# Patient Record
Sex: Female | Born: 1955 | Race: White | Hispanic: No | Marital: Married | Smoking: Current every day smoker
Health system: Southern US, Community
[De-identification: ages and names within clinical notes are randomized; demographics above are authoritative.]

## PROBLEM LIST (undated history)

## (undated) DIAGNOSIS — I8390 Asymptomatic varicose veins of unspecified lower extremity: Secondary | ICD-10-CM

## (undated) DIAGNOSIS — F329 Major depressive disorder, single episode, unspecified: Secondary | ICD-10-CM

## (undated) DIAGNOSIS — E119 Type 2 diabetes mellitus without complications: Secondary | ICD-10-CM

## (undated) DIAGNOSIS — E785 Hyperlipidemia, unspecified: Secondary | ICD-10-CM

## (undated) DIAGNOSIS — F32A Depression, unspecified: Secondary | ICD-10-CM

## (undated) DIAGNOSIS — E538 Deficiency of other specified B group vitamins: Secondary | ICD-10-CM

## (undated) DIAGNOSIS — J45909 Unspecified asthma, uncomplicated: Secondary | ICD-10-CM

## (undated) DIAGNOSIS — D649 Anemia, unspecified: Secondary | ICD-10-CM

## (undated) DIAGNOSIS — E559 Vitamin D deficiency, unspecified: Secondary | ICD-10-CM

## (undated) DIAGNOSIS — I4510 Unspecified right bundle-branch block: Secondary | ICD-10-CM

## (undated) DIAGNOSIS — I10 Essential (primary) hypertension: Secondary | ICD-10-CM

## (undated) HISTORY — PX: ABDOMINAL HYSTERECTOMY: SHX81

## (undated) HISTORY — DX: Type 2 diabetes mellitus without complications: E11.9

## (undated) HISTORY — DX: Major depressive disorder, single episode, unspecified: F32.9

## (undated) HISTORY — DX: Hyperlipidemia, unspecified: E78.5

## (undated) HISTORY — PX: TONSILLECTOMY: SUR1361

## (undated) HISTORY — DX: Unspecified right bundle-branch block: I45.10

## (undated) HISTORY — DX: Unspecified asthma, uncomplicated: J45.909

## (undated) HISTORY — DX: Deficiency of other specified B group vitamins: E53.8

## (undated) HISTORY — DX: Asymptomatic varicose veins of unspecified lower extremity: I83.90

## (undated) HISTORY — DX: Depression, unspecified: F32.A

## (undated) HISTORY — DX: Vitamin D deficiency, unspecified: E55.9

## (undated) HISTORY — PX: CHOLECYSTECTOMY: SHX55

## (undated) HISTORY — DX: Essential (primary) hypertension: I10

## (undated) HISTORY — DX: Anemia, unspecified: D64.9

---

## 1983-02-06 HISTORY — PX: OTHER SURGICAL HISTORY: SHX169

## 1999-01-04 ENCOUNTER — Other Ambulatory Visit: Admission: RE | Admit: 1999-01-04 | Discharge: 1999-01-04 | Payer: Self-pay | Admitting: Internal Medicine

## 2003-08-25 ENCOUNTER — Ambulatory Visit (HOSPITAL_COMMUNITY): Admission: RE | Admit: 2003-08-25 | Discharge: 2003-08-25 | Payer: Self-pay | Admitting: Internal Medicine

## 2003-10-12 ENCOUNTER — Other Ambulatory Visit: Admission: RE | Admit: 2003-10-12 | Discharge: 2003-10-12 | Payer: Self-pay | Admitting: Internal Medicine

## 2004-11-24 ENCOUNTER — Emergency Department (HOSPITAL_COMMUNITY): Admission: EM | Admit: 2004-11-24 | Discharge: 2004-11-24 | Payer: Self-pay | Admitting: Family Medicine

## 2005-03-22 ENCOUNTER — Emergency Department (HOSPITAL_COMMUNITY): Admission: EM | Admit: 2005-03-22 | Discharge: 2005-03-22 | Payer: Self-pay | Admitting: Family Medicine

## 2005-05-06 DIAGNOSIS — I4510 Unspecified right bundle-branch block: Secondary | ICD-10-CM

## 2005-05-06 HISTORY — DX: Unspecified right bundle-branch block: I45.10

## 2005-05-30 ENCOUNTER — Other Ambulatory Visit: Admission: RE | Admit: 2005-05-30 | Discharge: 2005-05-30 | Payer: Self-pay | Admitting: Internal Medicine

## 2005-10-02 ENCOUNTER — Emergency Department (HOSPITAL_COMMUNITY): Admission: EM | Admit: 2005-10-02 | Discharge: 2005-10-02 | Payer: Self-pay | Admitting: Emergency Medicine

## 2006-06-07 ENCOUNTER — Other Ambulatory Visit: Admission: RE | Admit: 2006-06-07 | Discharge: 2006-06-07 | Payer: Self-pay | Admitting: Internal Medicine

## 2006-06-10 ENCOUNTER — Ambulatory Visit (HOSPITAL_COMMUNITY): Admission: RE | Admit: 2006-06-10 | Discharge: 2006-06-10 | Payer: Self-pay | Admitting: Internal Medicine

## 2006-06-12 ENCOUNTER — Ambulatory Visit (HOSPITAL_COMMUNITY): Admission: RE | Admit: 2006-06-12 | Discharge: 2006-06-12 | Payer: Self-pay | Admitting: Internal Medicine

## 2006-07-25 ENCOUNTER — Ambulatory Visit (HOSPITAL_COMMUNITY): Admission: RE | Admit: 2006-07-25 | Discharge: 2006-07-26 | Payer: Self-pay | Admitting: Obstetrics and Gynecology

## 2006-07-25 ENCOUNTER — Encounter (INDEPENDENT_AMBULATORY_CARE_PROVIDER_SITE_OTHER): Payer: Self-pay | Admitting: Obstetrics and Gynecology

## 2007-06-20 ENCOUNTER — Emergency Department (HOSPITAL_COMMUNITY): Admission: EM | Admit: 2007-06-20 | Discharge: 2007-06-20 | Payer: Self-pay | Admitting: Emergency Medicine

## 2007-08-20 ENCOUNTER — Ambulatory Visit: Payer: Self-pay | Admitting: Vascular Surgery

## 2007-11-21 ENCOUNTER — Ambulatory Visit: Payer: Self-pay | Admitting: Vascular Surgery

## 2007-12-31 ENCOUNTER — Ambulatory Visit: Payer: Self-pay | Admitting: Vascular Surgery

## 2008-01-14 ENCOUNTER — Ambulatory Visit: Payer: Self-pay | Admitting: Vascular Surgery

## 2008-08-25 ENCOUNTER — Emergency Department (HOSPITAL_COMMUNITY): Admission: EM | Admit: 2008-08-25 | Discharge: 2008-08-25 | Payer: Self-pay | Admitting: Family Medicine

## 2010-06-20 NOTE — Assessment & Plan Note (Signed)
OFFICE VISIT   Joyce Shannon, Joyce Shannon  DOB:  03/17/1955                                       01/14/2008  ZOXWR#:[id number]   The patient presents today for a 1-week followup of laser ablation and  stab phlebectomy of left greater saphenous vein and multiple tributary  varicosities.  She had the usual amount of mild to moderate discomfort  following the procedure and has had no complications.  She reports that  the soreness in her thigh area is resolving.  She has been able to be up  walking since the procedure.   PHYSICAL EXAM:  She has mild bruising and her stab phlebectomy sites are  all healing quite nicely.  She underwent repeat duplex that shows no  evidence of her deep venous system.  She has reocclusion of her great  saphenous vein from her knee to her saphenofemoral junction.   I am quite pleased with her initial result, as is the patient.  Plan to  see her again in 6 weeks for final followup.   Larina Earthly, M.D.  Electronically Signed   TFE/MEDQ  D:  01/14/2008  T:  01/15/2008  Job:  [id number]   cc:   Lovenia Kim, D.O.

## 2010-06-20 NOTE — Assessment & Plan Note (Signed)
OFFICE VISIT   Joyce Shannon, Joyce Shannon  DOB:  [birth year]                                       11/21/2007  ZOXWR#:[id number]   The patient presents to my office today for continued follow-up of her  severe left leg venous varicosities.  She has now worn her graduated  compression stockings since her initial visit in July.  She reports that  this has not given her any relief, that still continues to have pain  both in her medial thigh, where she has marked varicosities, and also  extending down into her calf and foot.  She reports that she has  problems sleeping secondary to leg pain and swelling and has to position  her leg with pillows.  She has difficulty going up and down stairs in  her 2-story home secondary to pain and also has difficulty doing  housework or anything requiring prolonged standing or sitting.   PHYSICAL EXAM:  There is no change.  She has marked varicosities  throughout her left medial thigh extending down onto her medial calf.  She does have some tributary varicosities over her lateral pretibial  area on the left as well.   She underwent a formal duplex in our office today and this reveals gross  reflux through the great saphenous vein remnant from the level of her  groin to mid thigh.  This gives off all of these very large varicosities  throughout her thigh extending onto her calf.  I have recommended that  we proceed with laser ablation of her left great saphenous vein for  relief of her symptoms and stab phlebectomy of the multiple large  varicosities.  I explained that this is an outpatient procedure under  local anesthesia.  She reports that she has had what she perceived as  prolonged resolution from numbness with prior dental extractions but no  allergy to this.  We will proceed with treatment at her convenience, as  an outpatient, for laser ablation and stab phlebectomy for symptomatic  varicosities.   Larina Earthly, M.D.  Electronically Signed   TFE/MEDQ  D:  11/21/2007  T:  11/24/2007  Job:  [id number]   cc:   Lovenia Kim, D.O.

## 2010-06-20 NOTE — Procedures (Signed)
DUPLEX DEEP VENOUS EXAM - LOWER EXTREMITY   INDICATION:  Follow up post ablation.   HISTORY:  Edema:  No.  Trauma/Surgery:  Pain:  No.  PE:  No.  Previous DVT:  No.  Anticoagulants:  No.  Other:  No.   DUPLEX EXAM:                CFV   SFV   PopV  PTV    GSV                R  L  R  L  R  L  R   L  R  L  Thrombosis    o  o     o     o      o     +  Spontaneous   +  +     +     +      +     -  Phasic        +  +     +     +      +     -  Augmentation  +  +     +     +      +     -  Compressible  +  +     +     +      +     -  Competent     +  +     +     +      +     +   Legend:  + - yes  o - no  p - partial  D - decreased   IMPRESSION:  1. No evidence of deep venous thrombosis noted in the left lower      extremity.  2. Left greater saphenous vein ablated from proximal thigh to mid      distal thigh.    _____________________________  Larina Earthly, M.D.   AC/MEDQ  D:  01/14/2008  T:  01/14/2008  Job:  [id number]

## 2010-06-20 NOTE — Op Note (Signed)
NAMETASHAUNA, Joyce Shannon             ACCOUNT NO.:  [id number]   MEDICAL RECORD NO.:  [id number]          PATIENT TYPE:  AMB   LOCATION:  [facility]                           FACILITY:  [facility]   PHYSICIAN:  Zenaida Niece, M.D.DATE OF BIRTH:  07-20-1955   DATE OF PROCEDURE:  07/25/2006  DATE OF DISCHARGE:                               OPERATIVE REPORT   PREOPERATIVE DIAGNOSES:  Postmenopausal bleeding.   PROCEDURE:  Transvaginal hysterectomy with bilateral salpingo-  oophorectomy and cystoscopy.   SURGEON:  Lavina Hamman, M.D.   ASSISTANT:  Sherron Monday, M.D.   ANESTHESIA:  General endotracheal tube.   SPECIMENS:  Uterus, tubes and ovaries sent to pathology.   ESTIMATED BLOOD LOSS:  100 mL.   COMPLICATIONS:  None.   FINDINGS:  She had a normal-sized uterus and normal-appearing tubes and  ovaries.  She has an asymptomatic rectocele and cystocele.   PROCEDURE IN DETAIL:  The patient was taken to the operating room and  placed in the dorsal supine position.  General anesthesia was induced,  and she was placed in mobile stirrups.  Perineum and vagina were then  prepped and draped in the usual sterile fashion and bladder drained with  a latex-free catheter.  A weighted speculum was inserted into the vagina  and deaver retractors used anteriorly and laterally.  The cervix was  grasped with Christella Hartigan tenaculums and the cervicovaginal mucosa infiltrated  with a dilute solution of Pitressin.  This was then incised  circumferentially.  Sharp dissection was then used to further free the  vagina from the cervix.  Anteriorly, initially the bladder was pushed  off for a good ways, but the anterior peritoneum was not entered.  The  posterior cul-de-sac was easily identified and entered sharply.  A  bonanno speculum was placed into the posterior cul-de-sac.  Uterosacral  ligaments were clamped, transected and ligated with #1 chromic and  tagged for later use.  Uterine arteries were then clamped,  transected  and ligated with #1 chromic.  At this time, I was able to identify the  anterior peritoneum and enter  it sharply.  A Deaver retractor was used  to retract the bladder anterior.  The remainder of the cardinal  ligaments and broad ligaments were taken down, clamped, transected and  ligated with #1 chromic.  Utero-ovarian pedicles were then clamped,  transected and ligated with #1 chromic and tagged for inspection.  At  this point, the uterus was removed.  All pedicles at this point were  hemostatic.  Both ovaries were then easily identified and grasped with a  Babcock clamp.  I was able to pass a Zeppelin clamp medial to the ovary  in their entirety on each side.  These were then removed and the  pedicles ligated with #1 chromic.  The remainder of fallopian tubes were  then easily identified and pulled down into the incision.  Their  attachments were clamped, transected and ligated with #1 chromic so that  both tubes were removed.  Small amount of bleeding from the patient's  left side was controlled with figure-of-eight sutures of #1 chromic.  Pedicles  were again inspected and found to be hemostatic.  Uterosacral  ligaments were plicated in the midline with 2-0 silk, and the previously  tagged uterosacral pedicles were also tied in the midline.  The vagina  was then closed in a vertical fashion with running locking 2-0 Vicryl  with adequate closure and hemostasis.   Attention was turned to cystoscopy.  The patient had been given indigo  carmine IV, and a 70-degree cystoscope was inserted.  Good visualization  was achieved, and the bladder appeared normal.  Indigo carmine was seen  to flow freely from each ureteral orifice.  The cystoscope was removed,  and a Foley catheter was placed.  The patient was then taken down from  stirrups.  She was extubated in the operating room and taken to the  recovery room after tolerating the procedure well.  Counts were correct  x2.  She  received Ancef 1 gram IV prior to the procedure.  She had PAS  hose on throughout the procedure.      Zenaida Niece, M.D.  Electronically Signed     TDM/MEDQ  D:  07/25/2006  T:  07/25/2006  Job:  [id number]

## 2010-06-20 NOTE — Procedures (Signed)
LOWER EXTREMITY VENOUS REFLUX EXAM   INDICATION:  Left lower extremity varicose veins with pain and swelling.  Previous left vein stripping 1986.   EXAM:  Using color-flow imaging and pulse Doppler spectral analysis, the  left common femoral, superficial femoral, popliteal, posterior tibial,  greater and lesser saphenous veins are evaluated.  There is no evidence  suggesting deep venous insufficiency in the left lower extremity.   The left saphenofemoral junction is not competent.  The left GSV is not  competent with the caliber as described below.   The left proximal short saphenous vein demonstrates competency.   GSV Diameter (used if found to be incompetent only)                                            Right    Left  Proximal Greater Saphenous Vein           cm       0.85 cm  Proximal-to-mid-thigh                     cm       0.75 cm  Mid thigh                                 cm       cm  Mid-distal thigh                          cm       cm  Distal thigh                              cm       cm  Knee                                      cm       cm   IMPRESSION:  1. Left greater saphenous vein reflux is identified with the caliber      ranging from 0.85 cm to 0.75 cm in proximal thigh, distal to which      it feeds into extensive varicosities.  2. The left greater saphenous vein is not aneurysmal.  3. The left greater saphenous vein is tortuous distal to the proximal      thigh.  4. The deep venous system is competent.  5. The left lesser saphenous vein is competent.   ___________________________________________  Larina Earthly, M.D.   AS/MEDQ  D:  11/21/2007  T:  11/21/2007  Job:  [id number]

## 2010-06-20 NOTE — H&P (Signed)
Joyce Shannon, Joyce Shannon             ACCOUNT NO.:  [id number]   MEDICAL RECORD NO.:  [id number]          PATIENT TYPE:  AMB   LOCATION:  [facility]                           FACILITY:  [facility]   PHYSICIAN:  Zenaida Niece, M.D.DATE OF BIRTH:  31-Jan-1956   DATE OF ADMISSION:  07/25/2006  DATE OF DISCHARGE:                              HISTORY & PHYSICAL   CHIEF COMPLAINT:  Post-menopausal bleeding.   HISTORY OF PRESENT ILLNESS:  This is a 55 year old female, para 2-0-0-2,  who was referred to me by Dr. Marisue Brooklyn for post-menopausal  bleeding. She had had no bleeding for approximately 2-1/2 years and then  had cramping and bleeding for a week in April. She had a pelvic  ultrasound performed on May 5 which revealed an endometrial lining of  1.9 cm and a possible small fibroid and a tiny left ovarian cyst. Exam  in the office revealed a benign abdomen, and an endometrial biopsy was  performed. This has returned with fragments of atrophic endometrium. I  have discussed this with the patient and feel that there may be a polyp  which may be contributing to her bleeding. She wants to proceed with  definitive surgical therapy and is being admitted for hysterectomy at  this time.   PAST OBSTETRICAL HISTORY:  One vaginal delivery at term and one cesarean  section at term for a transverse lie.   PAST MEDICAL HISTORY:  Hypertension and depression.   PAST SURGICAL HISTORY:  1. Cesarean section.  2. Open cholecystectomy.  3. Removal of ovarian cyst.   ALLERGIES:  TO TYLOX WHICH CAUSES LOW BLOOD PRESSURE AND DIZZINESS.   CURRENT MEDICATIONS:  Lexapro and Lotrel.   GYNECOLOGICAL HISTORY:  Remote history of cryotherapy and no sexually  transmitted diseases.   SOCIAL HISTORY:  The patient is married and denies alcohol, tobacco or  drug use.   FAMILY HISTORY:  The patient's mom had breast cancer at age 61.   REVIEW OF SYSTEMS:  She is occasionally sexually active without problems  and denies  bowel or bladder symptoms. She also has no significant  lesions or discharge.   PHYSICAL EXAMINATION:  GENERAL:  This is a well-developed female in no  acute distress. Weight is 212 pounds.  NECK:  Supple without lymphadenopathy or thyromegaly.  LUNGS:  Clear to auscultation.  HEART:  Regular rate and rhythm without murmur.  ABDOMEN:  Soft, nontender, nondistended without palpable masses, and she  does have a well-healed transverse scar.  EXTREMITIES:  Have no edema and are nontender.  PELVIC EXAM:  External genitalia has no lesions. On speculum exam, she  has a normal cervix and the Pipelle sounded to 9 cm. On bimanual exam,  she has a small mid planar nontender uterus and no adnexal mass.   ASSESSMENT:  Post-menopausal bleeding with a possible endometrial polyp.  Observation and conservative surgery have been discussed with the  patient, but she wishes to proceed with definitive surgical therapy and  also wants her ovaries removed. All options have been discussed. Risks  of surgery have also been discussed. The patient wishes to proceed.  The plan is to admit the patient on the day of surgery for a vaginal  hysterectomy with bilateral salpingo-oophorectomy and cystoscopy.      Zenaida Niece, M.D.  Electronically Signed     TDM/MEDQ  D:  07/24/2006  T:  07/24/2006  Job:  [id number]

## 2010-06-20 NOTE — Consult Note (Signed)
NEW PATIENT CONSULTATION   Joyce Shannon, Joyce Shannon  DOB:  January 15, 1956                                       08/20/2007  GUYQI#:[id number]   The patient presents today for evaluation of left leg venous  varicosities.  She is a very pleasant 55 year old white female seen for  increasing severe left leg varicosities.  She is status post ligation  stripping of her saphenous vein from her ankle to mid thigh in 1986.  She has had progressively severe recurrence of varicosities throughout  her medial left thigh extending across her pretibial area on the left to  her lateral calf.  She reports severe pain associated with this  particularly with prolonged standing and sitting.  She does not have any  history of deep venous thrombosis or bleeding from these.   PAST MEDICAL HISTORY:  Hypertension, microalbuminuria, asthma, iron  deficiency anemia, depression, elevated cholesterol, type 2 diabetes,  ovarian cystectomy 1985, prior tonsillectomy.   PHYSICAL EXAMINATION:  A well-developed, well-nourished white female  appearing stated age of 21.  She does have 2+ dorsalis pedis pulses  bilaterally.  She has marked varicosities in her medial thigh extending  across her pretibial area to her lateral calf on the left.  She does  have several varicose veins on her medial right leg which are less  prominent.  She underwent a screening duplex by me and this does show  reflux in her saphenous vein.  She does have a segment from her  saphenofemoral junction down to her proximal thigh and there is gross  reflux through this into these large varicosities extending all  throughout her leg.   I had a long discussion with the patient and explained the significance  of her venous hypertension and recurrent varicosities.  She has not worn  compression garments and we have fitted her for these today, 20-30 mmHg  thigh high compression garments.  She was instructed on their use and we  will see  her again in 3 months to determine if she is able to tolerate  her levels of varicosities.  She will also continue to elevate her legs  when possible which she is already doing and also will use ibuprofen 600  mg q.6 hours as needed for discomfort.  We will see her for further  discussion and formal duplex in 3 months.   Larina Earthly, M.D.  Electronically Signed   TFE/MEDQ  D:  08/20/2007  T:  08/21/2007  Job:  [id number]   cc:   Lovenia Kim, D.O.

## 2010-07-17 ENCOUNTER — Inpatient Hospital Stay (INDEPENDENT_AMBULATORY_CARE_PROVIDER_SITE_OTHER)
Admission: RE | Admit: 2010-07-17 | Discharge: 2010-07-17 | Disposition: A | Discharge status: Home / Self Care | Payer: Self-pay | Source: Ambulatory Visit | Attending: Emergency Medicine | Admitting: Emergency Medicine

## 2010-07-17 DIAGNOSIS — J019 Acute sinusitis, unspecified: Secondary | ICD-10-CM

## 2010-11-22 LAB — BASIC METABOLIC PANEL
CO2: 27
Calcium: 9.6
Creatinine, Ser: 0.81
GFR calc Af Amer: >60
Glucose, Bld: 121 — ABNORMAL HIGH

## 2010-11-22 LAB — CBC
HCT: 37
MCHC: 33.5
MCV: 92.6
Platelets: 210
Platelets: 234
RBC: 3.99
RDW: 12.9

## 2012-01-21 ENCOUNTER — Ambulatory Visit (INDEPENDENT_AMBULATORY_CARE_PROVIDER_SITE_OTHER): Payer: 59 | Admitting: Family Medicine

## 2012-01-21 VITALS — BP 180/90 | HR 69 | Temp 98.1°F | Resp 18 | Ht 67.0 in | Wt 207.0 lb

## 2012-01-21 DIAGNOSIS — I10 Essential (primary) hypertension: Secondary | ICD-10-CM

## 2012-01-21 DIAGNOSIS — B079 Viral wart, unspecified: Secondary | ICD-10-CM

## 2012-01-21 DIAGNOSIS — B078 Other viral warts: Secondary | ICD-10-CM

## 2012-01-21 LAB — POCT UA - MICROSCOPIC ONLY
Casts, Ur, LPF, POC: NEGATIVE
Crystals, Ur, HPF, POC: NEGATIVE
Epithelial cells, urine per micros: NEGATIVE
Mucus, UA: NEGATIVE
WBC, Ur, HPF, POC: NEGATIVE
Yeast, UA: NEGATIVE

## 2012-01-21 LAB — POCT CBC
Granulocyte percent: 52.6 %G (ref 37–80)
HCT, POC: 45.1 % (ref 37.7–47.9)
Hemoglobin: 14 g/dL (ref 12.2–16.2)
Lymph, poc: 3.9 — AB (ref 0.6–3.4)
MCH, POC: 29.5 pg (ref 27–31.2)
MCHC: 31 g/dL — AB (ref 31.8–35.4)
MCV: 95 fL (ref 80–97)
MID (cbc): 0.7 (ref 0–0.9)
MPV: 8.1 fL (ref 0–99.8)
POC Granulocyte: 5 (ref 2–6.9)
POC LYMPH PERCENT: 40.6 %L (ref 10–50)
POC MID %: 6.8 %M (ref 0–12)
Platelet Count, POC: 246 10*3/uL (ref 142–424)
RBC: 4.75 M/uL (ref 4.04–5.48)
RDW, POC: 13.5 %
WBC: 9.6 10*3/uL (ref 4.6–10.2)

## 2012-01-21 LAB — POCT URINALYSIS DIPSTICK
Bilirubin, UA: NEGATIVE
Glucose, UA: NEGATIVE
Ketones, UA: NEGATIVE
Leukocytes, UA: NEGATIVE
Nitrite, UA: NEGATIVE
Protein, UA: NEGATIVE
Spec Grav, UA: 1.015
Urobilinogen, UA: 0.2
pH, UA: 6

## 2012-01-21 MED ORDER — LISINOPRIL-HYDROCHLOROTHIAZIDE 20-12.5 MG PO TABS: 1.0000 | ORAL_TABLET | Freq: Every day | ORAL | Status: DC

## 2012-01-21 NOTE — Progress Notes (Signed)
[facility]:  Office Visit  Chief Complaint:  Chief Complaint  Patient presents with  . Hypertension    also pt has something on tip of Rt 3rd finger    HPI: Joyce Shannon is a 56 y.o. female who complains of : 1. HTN-not on meds, was on Lotrel , but been off meds for 2 years. She went off of it because she stopped workign and felt her BP was well controlled. SE for Lotrel---Khalik Pewitt edema. Now has HA, deneis CP, SOB, palpitations, nausea/vomiting abd pain 2. Right 3rd finger benign appearing lesion, tried otc wart remedy without releif  History reviewed. No pertinent past medical history. Past Surgical History  Procedure Date  . Cesarean section   . Abdominal hysterectomy   . Cholecystectomy    History   Social History  . Marital Status: Married    Spouse Name: N/A    Number of Children: N/A  . Years of Education: N/A   Social History Main Topics  . Smoking status: Current Every Day Smoker -- 1.0 packs/day for 25 years    Types: Cigarettes  . Smokeless tobacco: Never Used  . Alcohol Use: No  . Drug Use: No  . Sexually Active: None   Other Topics Concern  . None   Social History Narrative  . None   Family History  Problem Relation Age of Onset  . Heart disease Mother   . Heart disease Father    No Known Allergies Prior to Admission medications   Not on File     ROS: The patient denies fevers, chills, night sweats, unintentional weight loss, chest pain, palpitations, wheezing, dyspnea on exertion, nausea, vomiting, abdominal pain, dysuria, hematuria, melena, numbness, weakness, or tingling.   All other systems have been reviewed and were otherwise negative with the exception of those mentioned in the HPI and as above.    PHYSICAL EXAM: Filed Vitals:   01/21/12 2041  BP: 180/90  Pulse:   Temp:   Resp:    Filed Vitals:   01/21/12 1916  Height: 5\' 7"  (1.702 m)  Weight: 207 lb (93.895 kg)   Body mass index is 32.42  kg/(m^2).  General: Alert, no acute distress HEENT:  Normocephalic, atraumatic, oropharynx patent. EOMi, PERRLA, fundoscpic exam nl, no pap[iledema Cardiovascular:  Regular rate and rhythm, no rubs murmurs or gallops.  No Carotid bruits, radial pulse intact. No pedal edema.  Respiratory: Clear to auscultation bilaterally.  No wheezes, rales, or rhonchi.  No cyanosis, no use of accessory musculature GI: No organomegaly, abdomen is soft and non-tender, positive bowel sounds.  No masses. Skin: + callus  Vs verruca like wart on right 3rd fnger at tip, no e/o seeds.  Neurologic: Facial musculature symmetric. Psychiatric: Patient is appropriate throughout our interaction. Lymphatic: No cervical lymphadenopathy Musculoskeletal: Gait intact.   LABS: Results for orders placed in visit on 01/21/12  POCT CBC      Component Value Range   WBC 9.6  4.6 - 10.2 K/uL   Lymph, poc 3.9 (*) 0.6 - 3.4   POC LYMPH PERCENT 40.6  10 - 50 %L   MID (cbc) 0.7  0 - 0.9   POC MID % 6.8  0 - 12 %M   POC Granulocyte 5.0  2 - 6.9   Granulocyte percent 52.6  37 - 80 %G   RBC 4.75  4.04 - 5.48 M/uL   Hemoglobin 14.0  12.2 - 16.2 g/dL   HCT, POC 16.1  09.6 -  47.9 %   MCV 95.0  80 - 97 fL   MCH, POC 29.5  27 - 31.2 pg   MCHC 31.0 (*) 31.8 - 35.4 g/dL   RDW, POC 40.9     Platelet Count, POC 246  142 - 424 K/uL   MPV 8.1  0 - 99.8 fL  POCT UA - MICROSCOPIC ONLY      Component Value Range   WBC, Ur, HPF, POC neg     RBC, urine, microscopic 1-3     Bacteria, U Microscopic trace     Mucus, UA neg     Epithelial cells, urine per micros neg     Crystals, Ur, HPF, POC neg     Casts, Ur, LPF, POC neg     Yeast, UA neg    POCT URINALYSIS DIPSTICK      Component Value Range   Color, UA yellow     Clarity, UA clear     Glucose, UA neg     Bilirubin, UA neg     Ketones, UA neg     Spec Grav, UA 1.015     Blood, UA small     pH, UA 6.0     Protein, UA neg     Urobilinogen, UA 0.2     Nitrite, UA neg      Leukocytes, UA Negative       EKG/XRAY:   Primary read interpreted by Dr. Conley Rolls at [facility].   ASSESSMENT/PLAN: Encounter Diagnoses  Name Primary?  Marland Kitchen Unspecified essential hypertension Yes  . Verruca vulgaris    Will put her on Lisinopril and HCTZ 20/12.5 mg combo ( Zestoretic)  F/u in 1 month , sooner if BP >140/90. Bring BP logs. CMP pending Removed skin lesion,likely viral wart vs callus, pt tolerated it well. Verbal consent was obtained for removal with 11 " blade F/u prn    Cleland Simkins PHUONG, DO 01/21/2012 8:42 PM

## 2012-01-22 DIAGNOSIS — I10 Essential (primary) hypertension: Secondary | ICD-10-CM | POA: Insufficient documentation

## 2012-01-22 LAB — COMPREHENSIVE METABOLIC PANEL WITH GFR
ALT: 12 U/L (ref 0–35)
BUN: 14 mg/dL (ref 6–23)
Calcium: 9.5 mg/dL (ref 8.4–10.5)
Chloride: 109 meq/L (ref 96–112)
Creat: 0.84 mg/dL (ref 0.50–1.10)
Glucose, Bld: 109 mg/dL — ABNORMAL HIGH (ref 70–99)
Potassium: 4 meq/L (ref 3.5–5.3)
Sodium: 141 meq/L (ref 135–145)

## 2012-01-22 LAB — COMPREHENSIVE METABOLIC PANEL
AST: 14 U/L (ref 0–37)
Albumin: 4 g/dL (ref 3.5–5.2)
Alkaline Phosphatase: 69 U/L (ref 39–117)
CO2: 24 mEq/L (ref 19–32)
Total Bilirubin: 0.4 mg/dL (ref 0.3–1.2)
Total Protein: 6.9 g/dL (ref 6.0–8.3)

## 2013-01-13 ENCOUNTER — Encounter: Payer: Self-pay | Admitting: Internal Medicine

## 2013-01-15 ENCOUNTER — Ambulatory Visit (INDEPENDENT_AMBULATORY_CARE_PROVIDER_SITE_OTHER): Payer: 59 | Admitting: Emergency Medicine

## 2013-01-15 ENCOUNTER — Encounter: Payer: Self-pay | Admitting: Emergency Medicine

## 2013-01-15 VITALS — BP 140/82 | HR 64 | Temp 98.0°F | Resp 18 | Ht 67.0 in | Wt 202.0 lb

## 2013-01-15 DIAGNOSIS — R7309 Other abnormal glucose: Secondary | ICD-10-CM

## 2013-01-15 DIAGNOSIS — I10 Essential (primary) hypertension: Secondary | ICD-10-CM

## 2013-01-15 DIAGNOSIS — E781 Pure hyperglyceridemia: Secondary | ICD-10-CM

## 2013-01-15 LAB — CBC WITH DIFFERENTIAL/PLATELET
Basophils Absolute: 0 10*3/uL (ref 0.0–0.1)
Basophils Relative: 0 % (ref 0–1)
Eosinophils Relative: 2 % (ref 0–5)
HCT: 39.1 % (ref 36.0–46.0)
Hemoglobin: 13.6 g/dL (ref 12.0–15.0)
Lymphocytes Relative: 38 % (ref 12–46)
Lymphs Abs: 3.8 10*3/uL (ref 0.7–4.0)
MCHC: 34.8 g/dL (ref 30.0–36.0)
Monocytes Absolute: 0.8 10*3/uL (ref 0.1–1.0)
Monocytes Relative: 8 % (ref 3–12)
Neutro Abs: 5.3 10*3/uL (ref 1.7–7.7)
Platelets: 259 10*3/uL (ref 150–400)
RBC: 4.46 MIL/uL (ref 3.87–5.11)
RDW: 13.9 % (ref 11.5–15.5)
WBC: 10.1 10*3/uL (ref 4.0–10.5)

## 2013-01-15 NOTE — Patient Instructions (Signed)
Cholesterol  Cholesterol is a type of fat. Your body needs a small amount of cholesterol, but too much can cause health problems. Certain problems include heart attacks, strokes, and not enough blood flow to your heart, brain, kidneys, or feet. You get cholesterol in 2 ways:   Naturally.   By eating certain foods.  HOME CARE   Eat a low-fat diet:   Eat less eggs, whole dairy products (whole milk, cheese, and butter), fatty meats, and fried foods.   Eat more fruits, vegetables, whole-wheat breads, lean chicken, and fish.   Follow your exercise program as told by your doctor.   Keep your weight at a healthy level. Talk to your doctor about what is right for you.   Only take medicine as told by your doctor.   Get your cholesterol checked once a year or as told by your doctor.  MAKE SURE YOU:   Understand these instructions.   Will watch your condition.   Will get help right away if you are not doing well or get worse.  Document Released: 04/20/2008 Document Revised: 04/16/2011 Document Reviewed: 04/20/2008  [employer] Patient Information 2014 [employer].

## 2013-01-16 ENCOUNTER — Other Ambulatory Visit: Payer: Self-pay | Admitting: Emergency Medicine

## 2013-01-16 ENCOUNTER — Telehealth: Payer: Self-pay | Admitting: *Deleted

## 2013-01-16 LAB — BASIC METABOLIC PANEL WITH GFR
Chloride: 105 mEq/L (ref 96–112)
Creat: 0.78 mg/dL (ref 0.50–1.10)
Potassium: 4.1 mEq/L (ref 3.5–5.3)
Sodium: 138 mEq/L (ref 135–145)

## 2013-01-16 LAB — LIPID PANEL
Cholesterol: 170 mg/dL (ref 0–200)
HDL: 36 mg/dL — ABNORMAL LOW (ref >39)
LDL Cholesterol: 83 mg/dL (ref 0–99)
Total CHOL/HDL Ratio: 4.7 Ratio

## 2013-01-16 LAB — HEMOGLOBIN A1C: Mean Plasma Glucose: 154 mg/dL — ABNORMAL HIGH (ref <117)

## 2013-01-16 LAB — HEPATIC FUNCTION PANEL
AST: 9 U/L (ref 0–37)
Indirect Bilirubin: 0.2 mg/dL (ref 0.0–0.9)

## 2013-01-16 MED ORDER — METFORMIN HCL 500 MG PO TABS: 500.0000 mg | ORAL_TABLET | Freq: Three times a day (TID) | ORAL | Status: DC

## 2013-01-16 NOTE — Telephone Encounter (Signed)
Message copied by Nicholaus Corolla A on Fri Jan 16, 2013 10:25 AM ------      Message from: [place], [place] R      Created: Fri Jan 16, 2013  6:35 AM       A1c was 6.7 now 7, true diabetes. Will add Metformin 500 mg start 1 daily and slowly in crease up to 3 times a day. It will help with weight loss but may cause stomach upset 1 st week of RX. Always take with food. TG elevated decrease fried, carbs, sugars, ETOH. Really needs WT loss and strict healthy diet and exercise, needs to start NOW not after the holidays.Needs recheck in 2 weeks BMP OV and diabetic education 30 minute slot with MS or AC. ------

## 2013-01-18 NOTE — Progress Notes (Signed)
   Subjective:    Patient ID: Joyce Shannon, female    DOB: 1955-08-24, 57 y.o.   MRN: [id number]  HPI Comments: 57 yo female presents for 3 month F/U for HTN, Cholesterol, DM, D. Deficient. She has decreased portion sizes with food but is not eating as healthy as she could.. She is starting to increase exercise. She is not checking her BP routinely, but occasionally in th e 130s. She is trying to lose weight but has gained 2 # since last OV.   Last abn LABS TG 255 H 31 BS 105 A1c 6.7  Hypertension     Current Outpatient Prescriptions on File Prior to Visit  Medication Sig Dispense Refill  . ALPRAZolam (XANAX) 0.25 MG tablet Take 0.25 mg by mouth 2 (two) times daily.      . Fluticasone Propionate (FLONASE NA) Place 1 spray into the nose 2 (two) times daily.       No current facility-administered medications on file prior to visit.  ALSO ON  METFORMIN 500 TID  LISINOPRIL/HCTZ 20/12.5  ALLERGIES Tylox  Review of Systems  All other systems reviewed and are negative.    BP 140/82  Pulse 64  Temp(Src) 98 F (36.7 C) (Temporal)  Resp 18  Ht 5\' 7"  (1.702 m)  Wt 202 lb (91.627 kg)  BMI 31.63 kg/m2     Objective:   Physical Exam  Nursing note and vitals reviewed. Constitutional: She is oriented to person, place, and time. She appears well-developed and well-nourished. No distress.  Obese  HENT:  Head: Normocephalic and atraumatic.  Right Ear: External ear normal.  Left Ear: External ear normal.  Nose: Nose normal.  Mouth/Throat: Oropharynx is clear and moist.  Eyes: Conjunctivae and EOM are normal.  Neck: Normal range of motion. Neck supple. No JVD present. No thyromegaly present.  Cardiovascular: Normal rate, regular rhythm, normal heart sounds and intact distal pulses.   Pulmonary/Chest: Effort normal and breath sounds normal.  Abdominal: Soft. Bowel sounds are normal. She exhibits no distension and no mass. There is no tenderness. There is no rebound and no  guarding.  Musculoskeletal: Normal range of motion. She exhibits no edema and no tenderness.  Lymphadenopathy:    She has no cervical adenopathy.  Neurological: She is alert and oriented to person, place, and time. No cranial nerve deficit.  Skin: Skin is warm and dry. No rash noted. No erythema. No pallor.  Psychiatric: She has a normal mood and affect. Her behavior is normal. Judgment and thought content normal.          Assessment & Plan:  1.  3 month F/U for HTN, Cholesterol, DM, D. Deficient. Needs healthy diet, cardio QD and obtain healthy weight. Check Labs, Check BP if >130/80 call office

## 2013-01-19 NOTE — Telephone Encounter (Signed)
Spoke with patient about recent lab results and instructions.  Released results to MyChart for patient to view results.  Patient not able to schedule 2 week f/u as directed d/t conflict with work.

## 2013-04-22 ENCOUNTER — Other Ambulatory Visit: Payer: Self-pay | Admitting: Emergency Medicine

## 2013-10-22 ENCOUNTER — Other Ambulatory Visit: Payer: Self-pay | Admitting: Emergency Medicine

## 2013-10-26 ENCOUNTER — Other Ambulatory Visit: Payer: Self-pay | Admitting: Internal Medicine

## 2013-11-05 ENCOUNTER — Telehealth: Payer: Self-pay | Admitting: Internal Medicine

## 2013-11-05 NOTE — Telephone Encounter (Signed)
LEFT MESSAGE FOR PATIENT TO CALL OFFICE TO RESCHEDULE OV 11-17-13.  Thank you, Katrina Judeth Horn [facility] [phone] Fax [phone]

## 2013-11-17 ENCOUNTER — Ambulatory Visit: Payer: Self-pay | Admitting: Emergency Medicine

## 2014-01-07 ENCOUNTER — Ambulatory Visit (INDEPENDENT_AMBULATORY_CARE_PROVIDER_SITE_OTHER): Payer: 59 | Admitting: Physician Assistant

## 2014-01-07 ENCOUNTER — Encounter: Payer: Self-pay | Admitting: Physician Assistant

## 2014-01-07 VITALS — BP 178/106 | HR 82 | Temp 98.4°F | Resp 18 | Ht 67.0 in | Wt 209.0 lb

## 2014-01-07 DIAGNOSIS — N182 Chronic kidney disease, stage 2 (mild): Secondary | ICD-10-CM | POA: Insufficient documentation

## 2014-01-07 DIAGNOSIS — Z79899 Other long term (current) drug therapy: Secondary | ICD-10-CM | POA: Insufficient documentation

## 2014-01-07 DIAGNOSIS — E1165 Type 2 diabetes mellitus with hyperglycemia: Secondary | ICD-10-CM

## 2014-01-07 DIAGNOSIS — E785 Hyperlipidemia, unspecified: Secondary | ICD-10-CM

## 2014-01-07 DIAGNOSIS — F3341 Major depressive disorder, recurrent, in partial remission: Secondary | ICD-10-CM | POA: Insufficient documentation

## 2014-01-07 DIAGNOSIS — Z720 Tobacco use: Secondary | ICD-10-CM

## 2014-01-07 DIAGNOSIS — E1169 Type 2 diabetes mellitus with other specified complication: Secondary | ICD-10-CM | POA: Insufficient documentation

## 2014-01-07 DIAGNOSIS — I10 Essential (primary) hypertension: Secondary | ICD-10-CM

## 2014-01-07 DIAGNOSIS — IMO0002 Reserved for concepts with insufficient information to code with codable children: Secondary | ICD-10-CM

## 2014-01-07 DIAGNOSIS — E1122 Type 2 diabetes mellitus with diabetic chronic kidney disease: Secondary | ICD-10-CM | POA: Insufficient documentation

## 2014-01-07 DIAGNOSIS — F419 Anxiety disorder, unspecified: Secondary | ICD-10-CM

## 2014-01-07 MED ORDER — AMLODIPINE BESY-BENAZEPRIL HCL 10-40 MG PO CAPS: 1.0000 | ORAL_CAPSULE | Freq: Every day | ORAL | Status: DC

## 2014-01-07 NOTE — Progress Notes (Signed)
Patient ID: Joyce Shannon, female   DOB: 1955-12-24, 58 y.o.   MRN: [id number]  Assessment and Plan:  1. Essential hypertension Stop taking Lisinopril and start taking Lotrel 10-40 as prescribed.  Monitor blood pressure at home.  Please bring BP readings to next appt with BP machine from home.  Reminder to go to the ER if any CP, SOB, nausea, dizziness, severe HA, changes vision/speech, left arm numbness and tingling, and jaw pain. - CBC with Differential - BASIC METABOLIC PANEL WITH GFR - Hepatic function panel - amLODipine-benazepril (LOTREL) 10-40 MG per capsule; Take 1 capsule by mouth daily.  Dispense: 30 capsule; Refill: 0  2. Diabetes type 2, uncontrolled Please follow recommended diet and exercise.  Patient does not want to be put on metformin at all due to patient being scarred of medication.  Check A1C and Insulin. - Hemoglobin A1c - Insulin, fasting  3. Hyperlipidemia Please follow recommended diet and exercise.  Check Cholesterol. - Lipid panel  4. Anxiety Please continue Xanax as prescribed.  5. Encounter for long-term (current) use of medications Will monitor kidney and liver function. - CBC with Differential - BASIC METABOLIC PANEL WITH GFR - Hepatic function panel  6. Tobacco use Will talk about smoking cessation at next appt.  Continue diet and meds as discussed. Further disposition pending results of labs. Discussed medication effects and SE's.  Pt agreed to treatment plan. Please follow up in one month for medication recheck (Lotrel).  HPI 58 y.o. female  presents to the office today due to high blood pressure readings at home.  Patient was last seen 01/15/13.  Patient has history of hypertension, hyperlipidemia, diabetes and anxiety.  Patient states she has been getting readings around 197 and 200 at home.  Patient states her husband has been in hospital recently and she has put her health on the "back burner".  Patient states she now wants to get her  health in shape.  She states her BP in worse in the mornings.  She is currently taking Lisinopril 20mg - 1 tablet twice a day (one in morning and one at night)  Her blood pressure has not been controlled at home, today their BP is BP: (!) 178/106 mmHg She does not workout. She denies chest pain, shortness of breath, dizziness.   She is not on cholesterol medication and denies myalgias. Her cholesterol is not at goal. The cholesterol last visit was:   Lab Results  Component Value Date   CHOL 170 01/15/2013   HDL 36* 01/15/2013   LDLCALC 83 01/15/2013   TRIG 253* 01/15/2013   CHOLHDL 4.7 01/15/2013   She has not been working on diet and exercise for diabetes, and denies increased appetite, polydipsia and polyuria. Last A1C in the office was:  Lab Results  Component Value Date   HGBA1C 7.0* 01/15/2013   She has had diabetes for a couple years. Currently manages prediabetes with?   Diet    Number of meals per day? 2  L-chicken   D-  Vegetables, eggs, hamburger, fish, chicken  Snacks? Chocolate, peanut M&Ms  Beverages? Water, coffee (3 cups per day), decaf tea  Up to date Eye Exam?  Spring 2015  Patient is not on Vitamin D supplement.  Is taking Mulitvitamin daily. No results found for: VD25OH    Anxiety- Currently takes Xanax.  She states she does not need a refill at this time.  She states she only takes them when needed for anxiety attacks.  Last prescription by [place]  was 04/23/13 - Take 1 tablet twice a day- 60 tablets No refills.  Patient states she has been worried about her health and is scared about risk for strokes due to high blood pressure.  Current Smoker- Smokes 1ppd for the past 30 years.  Current Medications:  Current Outpatient Prescriptions on File Prior to Visit  Medication Sig Dispense Refill  . ALPRAZolam (XANAX) 0.25 MG tablet TAKE 1 TABLET BY MOUTH TWICE A DAY 60 tablet 0  . lisinopril (PRINIVIL,ZESTRIL) 20 MG tablet TAKE 1 TABLET TWICE A DAY 180 tablet 0   No  current facility-administered medications on file prior to visit.   Medical History:  Past Medical History  Diagnosis Date  . Hypertension   . Hyperlipidemia   . Vitamin D deficiency   . Right bundle branch block 05/2005  . Asthma   . Anemia   . Depression   . Diabetes mellitus without complication    Allergies:  Allergies  Allergen Reactions  . Tylox [Oxycodone-Acetaminophen]     ROS- Review of Systems  Constitutional: Positive for malaise/fatigue. Negative for fever, chills and diaphoresis.  HENT: Negative for congestion, ear pain and sore throat.   Eyes: Negative.   Respiratory: Negative.  Negative for cough and wheezing.   Cardiovascular: Negative.  Negative for chest pain and palpitations.  Gastrointestinal: Positive for nausea. Negative for vomiting, abdominal pain and constipation.  Genitourinary: Negative.   Musculoskeletal: Negative.   Skin: Negative.   Neurological: Positive for dizziness and headaches. Negative for tingling and speech change.       States she has dizziness when getting up in the mornings or standing up to fast.  Psychiatric/Behavioral: Negative for depression. The patient is nervous/anxious.    Family history- Review and unchanged Social history- Review and unchanged Physical Exam: BP 178/106 mmHg  Pulse 82  Temp(Src) 98.4 F (36.9 C) (Temporal)  Resp 18  Ht 5\' 7"  (1.702 m)  Wt 209 lb (94.802 kg)  BMI 32.73 kg/m2  SpO2 97%  BP RECHECK- 160/85 Wt Readings from Last 3 Encounters:  01/07/14 209 lb (94.802 kg)  01/15/13 202 lb (91.627 kg)  01/21/12 207 lb (93.895 kg)  Vitals Reviewed.   General Appearance: Well nourished, in no apparent distress. Obese. Eyes: PERRLA, EOMs, conjunctiva no swelling or erythema. No scleral icterus. Sinuses: No Frontal/maxillary tenderness ENT/Mouth: Ext aud canals clear, TMs without erythema, bulging. No erythema, swelling, or exudate on post pharynx.  Tonsils not swollen or erythematous. Hearing normal.   Neck: Supple, thyroid normal.  Respiratory: Respiratory effort normal, CTAB.  No w/r/r or stridor.  Cardio: RRR.  m/r/g. S1S2nl.  Brisk peripheral pulses without edema.  Abdomen: Soft, + normal BS.  Non tender, no guarding, rebound, hernias, masses. Lymphatics: Non tender without lymphadenopathy.  Musculoskeletal: Full ROM, 5/5 strength, normal gait.  Skin: Warm, dry without rashes, lesions, ecchymosis.  Neuro: Cranial nerves intact. Normal muscle tone, no cerebellar symptoms. Sensation intact.  Psych: Awake and oriented X 3, normal affect, Insight and Judgment appropriate.    Mattie Nordell, Stephani Police, PA-C 4:28 PM [facility]

## 2014-01-07 NOTE — Patient Instructions (Signed)
-Take Lotrel 10-40 as prescribed. -Stop taking Lisinopril. -Please follow recommended diet and start exercising 120 minutes per week. -I will message you on MyChart with lab results.  Please follow up in one month for BP recheck  GIVE PT Burden  1)The amount of food you eat is important  -Too much can increase glucose levels and cause you to gain weight (which can  also increase glucose levels.  -Too little can decrease glucose level to unsafe levels (<70) 2)Eat meals and snacks at the same time each day to help your diabetes medication help you.  If you eat at different times each day, then the medication will not be as effective. 3)Do NOT skip meals or eat meals later than usual.  If you skip meals, then your glucose level can go low (<70).  Eating meals later than usual will not help the medication work effectively. 4) Amount of carbohydrates (carbs) per meal  -Breakfast- 30-45 grams of carbs  -Lunch and Dinner- 45-60 grams of carbs  -Snacks- 15-30 grams of carbs 5)Low fat foods have no more than 3 grams of fat per serving.  -Saturated- look for less than 1 gram per serving  -Trans Fat- look for 0 grams per serving 6)Exercise at least 120 minutes per week  Exercise Benefits:   -Lower LDL (Bad cholesterol)   -Lower blood pressure   -Increase HDL (Good cholesterol)   -Strengthen heart, lungs and muscles   -Burn calories and relieve stress   -Sleep better and help you feel better overall  To lower your risk of heart disease, limit your intake of saturated fat and trans fat as much as possible. THE GOOD WHAT IT DOES WHERE IT'S FOUND  MONOUNSATURATED FAT Lowers LDL and maybe raises HDL cholesterol Canola oil, olives, olive oil, peanuts, peanut oil, avocados, nuts  POLYUNSATURATED FAT Lowers LDL cholesterol Corn, safflower, sunflower and soybean oils, nuts, seeds  OMEGA-3 FATTY ACIDS Lowers triglycerides (blood fats) and blood pressure Salmon, mackerel,  herring, sardines, flax seed, flaxseed oil, walnuts, soybean oil  THE BAD WHAT IT DOES WHERE IT'S FOUND  SATURATED FAT Raises LDL (bad) cholesterol Butter, shortening, lard, red meat, cheese, whole milk, ice cream, coconut and palm oils  TRANS FAT Raises LDL cholesterol, lowers HDL (good) cholesterol Fried foods, some stick margarines, some cookies and crackers (look for hydrogenated fat on the ingredient list)  CHOLESTEROL FROM FOOD Too much may raise cholesterol levels Meat, poultry, seafood, eggs, milk, cheese, yogurt, butter   Plate Method (How much food of each food group) 1) Fill one half of your plate with nonstarchy vegetables: lettuce, broccoli, green beans, spinach, carrots or peppers. 2) Fill one quarter with protein: chicken, [place], fish, lean meat, eggs or tofu. 3) Fill one quarter with a nutritious carbohydrate food: brown rice, whole-wheat pasta, whole-wheat bread, peas, or corn.  Choose whole-wheat carbs for extra nutrition.  Controlling carbs helps you control your blood glucose. 4) Include a small piece of fruit at each meal, as well as 8 ounces of lowfat milk or yogurt. 5) Add 1-2 teaspoons of heart-healthy fat, such as olive or canola oil, trans fat-free margarine, avocado, nuts or seeds.   Metformin/Glucophage (Including extended release)  -Usually taken twice a day with breakfast and evening meal. -Effects- Decrease amount of glucose released from liver -Side Effects- Bloating, gas, diarrhea, upset stomach, loss of appetite (usually within first few weeks of starting) -Take with food to minimize symptoms. -Metformin is not likely to cause low  blood sugar. -Metformin is avoided in patients with decreased kidney (Cr greater than 1.4 for females and 1.5 for males) or liver function due to lactic acidosis.  - Metformin NEEDS TO BE STOPPED if you are having any imaging or surgical procedure that is using contrast dye.  We are starting you on Metformin to prevent or treat  diabetes. Metformin does not cause low blood sugars. In order to create energy your cells need insulin and sugar but sometime your cells do not accept the insulin and this can cause increased sugars and decreased energy. The Metformin helps your cells accept insulin and the sugar to give you more energy.   The two most common side effects are nausea and diarrhea, follow these rules to avoid it! You can take imodium per box instructions when starting metformin if needed.   Rules of metformin: 1) start out slow with only one pill daily. Our goal for you is 4 pills a day or 2000mg  total.  2) take with your largest meal. 3) Take with least amount of carbs.   Call if you have any problems.

## 2014-01-08 LAB — CBC WITH DIFFERENTIAL/PLATELET
BASOS PCT: 0 % (ref 0–1)
Basophils Absolute: 0 10*3/uL (ref 0.0–0.1)
EOS PCT: 2 % (ref 0–5)
Eosinophils Absolute: 0.2 10*3/uL (ref 0.0–0.7)
HEMATOCRIT: 41.2 % (ref 36.0–46.0)
Hemoglobin: 14.4 g/dL (ref 12.0–15.0)
LYMPHS ABS: 4.1 10*3/uL — AB (ref 0.7–4.0)
Lymphocytes Relative: 38 % (ref 12–46)
MCH: 31.3 pg (ref 26.0–34.0)
MCHC: 35 g/dL (ref 30.0–36.0)
MCV: 89.6 fL (ref 78.0–100.0)
MONO ABS: 0.9 10*3/uL (ref 0.1–1.0)
MONOS PCT: 8 % (ref 3–12)
MPV: 9.3 fL — AB (ref 9.4–12.4)
Neutro Abs: 5.6 10*3/uL (ref 1.7–7.7)
Neutrophils Relative %: 52 % (ref 43–77)
PLATELETS: 251 10*3/uL (ref 150–400)
RBC: 4.6 MIL/uL (ref 3.87–5.11)
RDW: 13.8 % (ref 11.5–15.5)
WBC: 10.7 10*3/uL — AB (ref 4.0–10.5)

## 2014-01-08 LAB — BASIC METABOLIC PANEL WITH GFR
BUN: 14 mg/dL (ref 6–23)
CO2: 26 mEq/L (ref 19–32)
Calcium: 9.7 mg/dL (ref 8.4–10.5)
Chloride: 108 mEq/L (ref 96–112)
Creat: 0.79 mg/dL (ref 0.50–1.10)
GFR, Est African American: >89 mL/min
GFR, Est Non African American: 82 mL/min
GLUCOSE: 81 mg/dL (ref 70–99)
Potassium: 4 mEq/L (ref 3.5–5.3)
Sodium: 142 mEq/L (ref 135–145)

## 2014-01-08 LAB — LIPID PANEL
CHOL/HDL RATIO: 4.7 ratio
CHOLESTEROL: 160 mg/dL (ref 0–200)
HDL: 34 mg/dL — ABNORMAL LOW (ref >39)
LDL CALC: 78 mg/dL (ref 0–99)
Triglycerides: 241 mg/dL — ABNORMAL HIGH (ref <150)
VLDL: 48 mg/dL — AB (ref 0–40)

## 2014-01-08 LAB — HEPATIC FUNCTION PANEL
ALBUMIN: 4 g/dL (ref 3.5–5.2)
ALK PHOS: 58 U/L (ref 39–117)
ALT: 14 U/L (ref 0–35)
AST: 13 U/L (ref 0–37)
Bilirubin, Direct: 0.1 mg/dL (ref 0.0–0.3)
Indirect Bilirubin: 0.5 mg/dL (ref 0.2–1.2)
TOTAL PROTEIN: 7 g/dL (ref 6.0–8.3)
Total Bilirubin: 0.6 mg/dL (ref 0.2–1.2)

## 2014-01-08 LAB — HEMOGLOBIN A1C
HEMOGLOBIN A1C: 6.9 % — AB (ref <5.7)
Mean Plasma Glucose: 151 mg/dL — ABNORMAL HIGH (ref <117)

## 2014-01-08 LAB — INSULIN, FASTING: INSULIN FASTING, SERUM: 7.3 u[IU]/mL (ref 2.0–19.6)

## 2014-01-11 ENCOUNTER — Other Ambulatory Visit: Payer: Self-pay | Admitting: Physician Assistant

## 2014-01-11 DIAGNOSIS — E1165 Type 2 diabetes mellitus with hyperglycemia: Secondary | ICD-10-CM

## 2014-01-11 DIAGNOSIS — IMO0002 Reserved for concepts with insufficient information to code with codable children: Secondary | ICD-10-CM

## 2014-01-11 MED ORDER — LINAGLIPTIN 5 MG PO TABS: 5.0000 mg | ORAL_TABLET | Freq: Every day | ORAL | Status: DC

## 2014-01-11 NOTE — Progress Notes (Signed)
Left message for patient to pick up samples and placed up front for patient to pick up.

## 2014-01-20 ENCOUNTER — Telehealth: Payer: Self-pay | Admitting: *Deleted

## 2014-01-20 MED ORDER — HYDROCHLOROTHIAZIDE 12.5 MG PO CAPS: 12.5000 mg | ORAL_CAPSULE | Freq: Every day | ORAL | Status: DC

## 2014-01-20 NOTE — Telephone Encounter (Signed)
Patient called with concerns about BP still saying elevated, average of 182/92.  Notes headache and increasing fatigue.  Per Dewayne Shorter, PA-C's orders, Rx for HCTZ sent onto the pharmacy for patient to add to current medications.  Patient to f/u in 1 week for ov and advised her to keep record of BP readings and to bring her BP cuff to the ov.

## 2014-01-30 ENCOUNTER — Other Ambulatory Visit: Payer: Self-pay | Admitting: Physician Assistant

## 2014-02-02 ENCOUNTER — Ambulatory Visit (INDEPENDENT_AMBULATORY_CARE_PROVIDER_SITE_OTHER): Payer: 59 | Admitting: Physician Assistant

## 2014-02-02 ENCOUNTER — Other Ambulatory Visit: Payer: Self-pay | Admitting: Internal Medicine

## 2014-02-02 ENCOUNTER — Encounter: Payer: Self-pay | Admitting: Physician Assistant

## 2014-02-02 VITALS — BP 148/82 | HR 78 | Temp 98.4°F | Resp 18 | Ht 67.0 in | Wt 206.0 lb

## 2014-02-02 DIAGNOSIS — IMO0002 Reserved for concepts with insufficient information to code with codable children: Secondary | ICD-10-CM

## 2014-02-02 DIAGNOSIS — E1165 Type 2 diabetes mellitus with hyperglycemia: Secondary | ICD-10-CM

## 2014-02-02 DIAGNOSIS — Z79899 Other long term (current) drug therapy: Secondary | ICD-10-CM

## 2014-02-02 DIAGNOSIS — I10 Essential (primary) hypertension: Secondary | ICD-10-CM

## 2014-02-02 DIAGNOSIS — R519 Headache, unspecified: Secondary | ICD-10-CM

## 2014-02-02 DIAGNOSIS — R51 Headache: Secondary | ICD-10-CM

## 2014-02-02 LAB — BASIC METABOLIC PANEL WITH GFR
BUN: 9 mg/dL (ref 6–23)
CALCIUM: 9.3 mg/dL (ref 8.4–10.5)
CHLORIDE: 106 meq/L (ref 96–112)
CO2: 27 meq/L (ref 19–32)
CREATININE: 0.79 mg/dL (ref 0.50–1.10)
GFR, Est African American: >89 mL/min
GFR, Est Non African American: 82 mL/min
GLUCOSE: 121 mg/dL — AB (ref 70–99)
Potassium: 4.1 mEq/L (ref 3.5–5.3)
SODIUM: 140 meq/L (ref 135–145)

## 2014-02-02 LAB — CBC WITH DIFFERENTIAL/PLATELET
BASOS ABS: 0 10*3/uL (ref 0.0–0.1)
BASOS PCT: 0 % (ref 0–1)
Eosinophils Absolute: 0.2 10*3/uL (ref 0.0–0.7)
Eosinophils Relative: 2 % (ref 0–5)
HCT: 40.8 % (ref 36.0–46.0)
Hemoglobin: 14.1 g/dL (ref 12.0–15.0)
LYMPHS PCT: 39 % (ref 12–46)
Lymphs Abs: 3.8 10*3/uL (ref 0.7–4.0)
MCH: 31.5 pg (ref 26.0–34.0)
MCHC: 34.6 g/dL (ref 30.0–36.0)
MCV: 91.3 fL (ref 78.0–100.0)
MPV: 9.4 fL (ref 8.6–12.4)
Monocytes Absolute: 0.8 10*3/uL (ref 0.1–1.0)
Monocytes Relative: 8 % (ref 3–12)
NEUTROS ABS: 5 10*3/uL (ref 1.7–7.7)
NEUTROS PCT: 51 % (ref 43–77)
PLATELETS: 244 10*3/uL (ref 150–400)
RBC: 4.47 MIL/uL (ref 3.87–5.11)
RDW: 13.3 % (ref 11.5–15.5)
WBC: 9.8 10*3/uL (ref 4.0–10.5)

## 2014-02-02 LAB — HEPATIC FUNCTION PANEL
ALBUMIN: 4.1 g/dL (ref 3.5–5.2)
ALT: 14 U/L (ref 0–35)
AST: 12 U/L (ref 0–37)
Alkaline Phosphatase: 67 U/L (ref 39–117)
BILIRUBIN INDIRECT: 0.2 mg/dL (ref 0.2–1.2)
Bilirubin, Direct: 0.1 mg/dL (ref 0.0–0.3)
TOTAL PROTEIN: 6.9 g/dL (ref 6.0–8.3)
Total Bilirubin: 0.3 mg/dL (ref 0.2–1.2)

## 2014-02-02 MED ORDER — AMLODIPINE BESY-BENAZEPRIL HCL 10-40 MG PO CAPS: 1.0000 | ORAL_CAPSULE | Freq: Every day | ORAL | Status: DC

## 2014-02-02 MED ORDER — LINAGLIPTIN 5 MG PO TABS: 5.0000 mg | ORAL_TABLET | Freq: Every day | ORAL | Status: DC

## 2014-02-02 NOTE — Patient Instructions (Signed)
-  Please continue medications as prescribed. Please follow up in 3 months for regular follow up with labs.    Please schedule physical in 6 months.  GIVE PT FOOD CHOICE LISTS FOR MEAL PLANNING  1)The amount of food you eat is important  -Too much can increase glucose levels and cause you to gain weight (which can  also increase glucose levels.  -Too little can decrease glucose level to unsafe levels (<70) 2)Eat meals and snacks at the same time each day to help your diabetes medication help you.  If you eat at different times each day, then the medication will not be as effective. 3)Do NOT skip meals or eat meals later than usual.  If you skip meals, then your glucose level can go low (<70).  Eating meals later than usual will not help the medication work effectively. 4) Amount of carbohydrates (carbs) per meal  -Breakfast- 30-45 grams of carbs  -Lunch and Dinner- 45-60 grams of carbs  -Snacks- 15-30 grams of carbs 5)Low fat foods have no more than 3 grams of fat per serving.  -Saturated- look for less than 1 gram per serving  -Trans Fat- look for 0 grams per serving 6)Exercise at least 120 minutes per week  Exercise Benefits:   -Lower LDL (Bad cholesterol)   -Lower blood pressure   -Increase HDL (Good cholesterol)   -Strengthen heart, lungs and muscles   -Burn calories and relieve stress   -Sleep better and help you feel better overall  To lower your risk of heart disease, limit your intake of saturated fat and trans fat as much as possible. THE GOOD WHAT IT DOES WHERE IT'S FOUND  MONOUNSATURATED FAT Lowers LDL and maybe raises HDL cholesterol Canola oil, olives, olive oil, peanuts, peanut oil, avocados, nuts  POLYUNSATURATED FAT Lowers LDL cholesterol Corn, safflower, sunflower and soybean oils, nuts, seeds  OMEGA-3 FATTY ACIDS Lowers triglycerides (blood fats) and blood pressure Salmon, mackerel, herring, sardines, flax seed, flaxseed oil, walnuts, soybean oil  THE BAD WHAT IT DOES  WHERE IT'S FOUND  SATURATED FAT Raises LDL (bad) cholesterol Butter, shortening, lard, red meat, cheese, whole milk, ice cream, coconut and palm oils  TRANS FAT Raises LDL cholesterol, lowers HDL (good) cholesterol Fried foods, some stick margarines, some cookies and crackers (look for hydrogenated fat on the ingredient list)  CHOLESTEROL FROM FOOD Too much may raise cholesterol levels Meat, poultry, seafood, eggs, milk, cheese, yogurt, butter   Plate Method (How much food of each food group) 1) Fill one half of your plate with nonstarchy vegetables: lettuce, broccoli, green beans, spinach, carrots or peppers. 2) Fill one quarter with protein: chicken, [place], fish, lean meat, eggs or tofu. 3) Fill one quarter with a nutritious carbohydrate food: brown rice, whole-wheat pasta, whole-wheat bread, peas, or corn.  Choose whole-wheat carbs for extra nutrition.  Controlling carbs helps you control your blood glucose. 4) Include a small piece of fruit at each meal, as well as 8 ounces of lowfat milk or yogurt. 5) Add 1-2 teaspoons of heart-healthy fat, such as olive or canola oil, trans fat-free margarine, avocado, nuts or seeds.

## 2014-02-02 NOTE — Progress Notes (Signed)
Patient ID: Joyce Shannon, female   DOB: 08-30-1955, 58 y.o.   MRN: [id number]  HPI A Caucasian 58 y.o.female presents for follow up for hypertension.  Patient was restarted on Lotrel 10-40mg  on 01/07/14.  Patient called on 01/20/14 and stated her BP readings were still high (182/92).  Started HCTZ 12.5mg  daily to help lower BP.  Patient was also started on Tradjenta 5mg  due to A1C on 01/07/14 was 6.9.  Patient refused to take Metformin.   Patient states she is taking the Lotrel in evening and HCTZ in the morning and Tradjenta at lunch.  Patient states she is feeling much better and has more energy and is eating better. She reports that she even started a better eating lifestyle with co-worker and they are helping each other out.  Patient only reports headaches and denies any other symptoms.  GFR= 82 on 02/02/14 Past Medical History  Diagnosis Date  . Hypertension   . Hyperlipidemia   . Vitamin D deficiency   . Right bundle branch block 05/2005  . Asthma   . Anemia   . Depression   . Diabetes mellitus without complication     Allergies  Allergen Reactions  . Tylox [Oxycodone-Acetaminophen]     Current Outpatient Prescriptions on File Prior to Visit  Medication Sig Dispense Refill  . ALPRAZolam (XANAX) 0.25 MG tablet TAKE 1 TABLET BY MOUTH TWICE A DAY 60 tablet 0  . amLODipine-benazepril (LOTREL) 10-40 MG per capsule TAKE 1 CAPSULE BY MOUTH DAILY. 30 capsule 0  . hydrochlorothiazide (MICROZIDE) 12.5 MG capsule Take 1 capsule (12.5 mg total) by mouth daily. 30 capsule 3  . linagliptin (TRADJENTA) 5 MG TABS tablet Take 1 tablet (5 mg total) by mouth daily. 28 tablet 0   No current facility-administered medications on file prior to visit.   ROS:   Review of Systems  Constitutional: Negative.   HENT: Negative for congestion, ear discharge, ear pain, hearing loss and sore throat.        Patient states she does get headaches and thinks it can be from TMJ.  Eyes: Negative.   Respiratory:  Negative.  Negative for stridor.   Cardiovascular: Negative.   Gastrointestinal: Negative.   Genitourinary: Negative.   Musculoskeletal: Negative.   Skin: Negative.   Neurological: Positive for headaches.  Psychiatric/Behavioral: Negative.    Physical Exam: BP 148/82 mmHg  Pulse 78  Temp(Src) 98.4 F (36.9 C) (Temporal)  Resp 18  Ht 5\' 7"  (1.702 m)  Wt 206 lb (93.441 kg)  BMI 32.26 kg/m2  SpO2 97% Wt Readings from Last 3 Encounters:  02/02/14 206 lb (93.441 kg)  01/07/14 209 lb (94.802 kg)  01/15/13 202 lb (91.627 kg)  Vitals Reviewed General Appearance: Well nourished, in no apparent distress and had pleasant demeanor. Obese. Eyes:  PERRLA. EOMI. Conjunctiva is pink without swelling, redness or yellowing.  No scleral icterus. Sinuses: No Frontal/maxillary tenderness Ears: No redness, swelling or tenderness on both external ear cartilages and ear canals.  TMs are intact bilaterally with normal light reflex and without redness, swelling, or bulging. Nose: Nose is symmetrical and turbinates are pink and not red, pale or swollen.  No polyps, tenderness or rhinorrhea. Throat: Oral pharynx is pink and moist.  No redness or swelling in posterior pharynx  Mucosa is intact and without lesions.  Uvula is midline and not swollen. Neck: Supple,  JVD,  carotid bruits,  LAD,  thyromegaly or thyroid masses.  Trachea is midline.  Full range of motion in neck  intact Respiratory: Chest wall expansion symmetrical.  CTAB,  r/r/w or stridor.  No increased effort of breathing. Cardio: RRR.   m/r/g.  S1S2nl.   Abdomen: Symmetrical, soft, nontender, and rounded.  +BS nl x4.   Rebound.  Guarding. Extremities:  C/C/E in upper and lower extremities.  Pulses B/L +2. Musculoskeletal: Range of motion and strength intact bilaterally in upper and lower extremities Skin: Warm, dry intact without rashes, lesions, ecchymosis, yellowing, cyanosis.  Skin has good turgor.    Neuro: Alert and oriented  X3, cooperative.  Mood and affect appropriate to situation.    CN II-XII grossly intact.  Normal gait. Psych: Insight and Judgment appropriate.    Assessment and Plan: 1. Essential hypertension -Continue Lotrel as prescribed- amLODipine-benazepril (LOTREL) 10-40 MG per capsule; Take 1 capsule by mouth daily.  Dispense: 90 capsule; Refill: 0 -Continue HCTZ as prescribed. - CBC with Differential - BASIC METABOLIC PANEL WITH GFR - Hepatic function panel  2. Encounter for long-term (current) use of medications Will monitor kidney and liver function. - CBC with Differential - BASIC METABOLIC PANEL WITH GFR - Hepatic function panel  3. Headaches Gave pt TMJ info and exercises to try.  Continue all other medications as prescribed. Discussed medication effects and SE's.  Pt agreed to treatment plan. Please follow up in 3 months for regular follow up with labs.  Please follow up in 6 months for physical.  Branndon Tuite, Stephani Police, PA-C 4:32 PM [facility]

## 2014-02-17 ENCOUNTER — Ambulatory Visit: Payer: Self-pay | Admitting: Physician Assistant

## 2014-05-19 ENCOUNTER — Other Ambulatory Visit: Payer: Self-pay | Admitting: Physician Assistant

## 2014-05-19 DIAGNOSIS — I10 Essential (primary) hypertension: Secondary | ICD-10-CM

## 2014-05-20 ENCOUNTER — Other Ambulatory Visit: Payer: Self-pay | Admitting: *Deleted

## 2014-05-20 DIAGNOSIS — I10 Essential (primary) hypertension: Secondary | ICD-10-CM

## 2014-05-20 MED ORDER — AMLODIPINE BESY-BENAZEPRIL HCL 10-40 MG PO CAPS: 1.0000 | ORAL_CAPSULE | Freq: Every day | ORAL | Status: DC

## 2014-05-26 ENCOUNTER — Ambulatory Visit: Payer: Self-pay | Admitting: Physician Assistant

## 2014-06-21 ENCOUNTER — Ambulatory Visit (INDEPENDENT_AMBULATORY_CARE_PROVIDER_SITE_OTHER): Payer: 59 | Admitting: Physician Assistant

## 2014-06-21 ENCOUNTER — Encounter: Payer: Self-pay | Admitting: Physician Assistant

## 2014-06-21 ENCOUNTER — Other Ambulatory Visit: Payer: Self-pay

## 2014-06-21 VITALS — BP 138/80 | HR 72 | Temp 97.9°F | Resp 16 | Ht 67.0 in | Wt 206.0 lb

## 2014-06-21 DIAGNOSIS — I10 Essential (primary) hypertension: Secondary | ICD-10-CM

## 2014-06-21 DIAGNOSIS — E559 Vitamin D deficiency, unspecified: Secondary | ICD-10-CM | POA: Insufficient documentation

## 2014-06-21 DIAGNOSIS — E669 Obesity, unspecified: Secondary | ICD-10-CM

## 2014-06-21 DIAGNOSIS — Z720 Tobacco use: Secondary | ICD-10-CM

## 2014-06-21 DIAGNOSIS — IMO0002 Reserved for concepts with insufficient information to code with codable children: Secondary | ICD-10-CM

## 2014-06-21 DIAGNOSIS — E1165 Type 2 diabetes mellitus with hyperglycemia: Secondary | ICD-10-CM

## 2014-06-21 DIAGNOSIS — E66811 Obesity, class 1: Secondary | ICD-10-CM | POA: Insufficient documentation

## 2014-06-21 DIAGNOSIS — Z79899 Other long term (current) drug therapy: Secondary | ICD-10-CM

## 2014-06-21 DIAGNOSIS — E785 Hyperlipidemia, unspecified: Secondary | ICD-10-CM

## 2014-06-21 LAB — CBC WITH DIFFERENTIAL/PLATELET
BASOS ABS: 0 10*3/uL (ref 0.0–0.1)
BASOS PCT: 0 % (ref 0–1)
Eosinophils Absolute: 0.2 10*3/uL (ref 0.0–0.7)
Eosinophils Relative: 2 % (ref 0–5)
HEMATOCRIT: 40.6 % (ref 36.0–46.0)
HEMOGLOBIN: 14.1 g/dL (ref 12.0–15.0)
LYMPHS ABS: 3.8 10*3/uL (ref 0.7–4.0)
LYMPHS PCT: 37 % (ref 12–46)
MCH: 31.4 pg (ref 26.0–34.0)
MCHC: 34.7 g/dL (ref 30.0–36.0)
MCV: 90.4 fL (ref 78.0–100.0)
MONOS PCT: 9 % (ref 3–12)
MPV: 9.3 fL (ref 8.6–12.4)
Monocytes Absolute: 0.9 10*3/uL (ref 0.1–1.0)
NEUTROS ABS: 5.4 10*3/uL (ref 1.7–7.7)
NEUTROS PCT: 52 % (ref 43–77)
Platelets: 243 10*3/uL (ref 150–400)
RBC: 4.49 MIL/uL (ref 3.87–5.11)
RDW: 13.3 % (ref 11.5–15.5)
WBC: 10.3 10*3/uL (ref 4.0–10.5)

## 2014-06-21 LAB — HEMOGLOBIN A1C
Hgb A1c MFr Bld: 7.6 % — ABNORMAL HIGH (ref <5.7)
MEAN PLASMA GLUCOSE: 171 mg/dL — AB (ref <117)

## 2014-06-21 MED ORDER — CANAGLIFLOZIN 300 MG PO TABS: 300.0000 mg | ORAL_TABLET | Freq: Every day | ORAL | Status: DC

## 2014-06-21 MED ORDER — METFORMIN HCL ER 500 MG PO TB24: 1500.0000 mg | ORAL_TABLET | Freq: Three times a day (TID) | ORAL | Status: DC

## 2014-06-21 NOTE — Patient Instructions (Addendum)
Add ENTERIC COATED low dose 81 mg Aspirin daily OR can do every other day if you have easy bruising to protect your heart and head. As well as to reduce risk of Colon Cancer by 20 %, Skin Cancer by 26 % , Melanoma by 46% and Pancreatic cancer by 60%  We are starting you on Metformin to prevent or treat diabetes. Metformin does not cause low blood sugars. In order to create energy your cells need insulin and sugar but sometime your cells do not accept the insulin and this can cause increased sugars and decreased energy. The Metformin helps your cells accept insulin and the sugar to give you more energy.   The two most common side effects are nausea and diarrhea, follow these rules to avoid it! You can take imodium per box instructions when starting metformin if needed.   Rules of metformin: 1) start out slow with only one pill daily. Our goal for you is 4 pills a day or 2000mg  total.  2) take with your largest meal. 3) Take with least amount of carbs.   Please start 100 of Invokana for 1 week then go up to 300mg  of Invokana. This can decreases your blood pressure so please contact us if you have any dizziness, sometime we need to decrease or stop fluid pills or decrease BP meds. You are peeing out 300-400 calories a day of sugar which can lead to yeast infections, you can take the diflucan as needed but if the infections continue we will stop the medications. We will have you follow up in 1 month to check your kidney function and weight. Call if you need anything.   Call if you have any problems.   Targets for Glucose Readings: Time of Check Target for patients WITHOUT Diabetes Target for DIABETICS  Before Meals Less than 100  less than 150  Two hours after meals Less than 200  Less than 250   If your morning sugar is always below 100 then the issue is with your sugar spiking after meals. Try to take your blood sugar approximately 2 hours after eating, this number should be less than 200. If it is  not, think about the foods that you ate and better choices you can make.   Diabetes is a very complicated disease...lets simplify it.  An easy way to look at it to understand the complications is if you think of the extra sugar floating in your blood stream as glass shards floating through your blood stream.    Diabetes affects your small vessels first: 1) The glass shards (sugar) scraps down the tiny blood vessels in your eyes and lead to diabetic retinopathy, the leading cause of blindness in the [place]. Diabetes is the leading cause of newly diagnosed adult (12 to 61 years of age) blindness in the [place].  2) The glass shards scratches down the tiny vessels of your legs leading to nerve damage called neuropathy and can lead to amputations of your feet. More than 60% of all non-traumatic amputations of lower limbs occur in people with diabetes.  3) Over time the small vessels in your brain are shredded and closed off, individually this does not cause any problems but over a long period of time many of the small vessels being blocked can lead to Vascular Dementia.   4) Your kidney's are a filter system and have a "net" that keeps certain things in the body and lets bad things out. Sugar shreds this net and leads  to kidney damage and eventually failure. Decreasing the sugar that is destroying the net and certain blood pressure medications can help stop or decrease progression of kidney disease. Diabetes was the primary cause of kidney failure in 44 percent of all new cases in 2011.  5) Diabetes also destroys the small vessels in your penis that lead to erectile dysfunction. Eventually the vessels are so damaged that you may not be responsive to cialis or viagra.   Diabetes and your large vessels: Your larger vessels consist of your coronary arteries in your heart and the carotid vessels to your brain. Diabetes or even increased sugars put you at 300% increased risk of heart attack and stroke and  this is why.. The sugar scrapes down your large blood vessels and your body sees this as an internal injury and tries to repair itself. Just like you get a scab on your skin, your platelets will stick to the blood vessel wall trying to heal it. This is why we have diabetics on low dose aspirin daily, this prevents the platelets from sticking and can prevent plaque formation. In addition, your body takes cholesterol and tries to shove it into the open wound. This is why we want your LDL, or bad cholesterol, below 70.   The combination of platelets and cholesterol over 5-10 years forms plaque that can break off and cause a heart attack or stroke.   PLEASE REMEMBER:  Diabetes is preventable! Up to 83 percent of complications and morbidities among individuals with type 2 diabetes can be prevented, delayed, or effectively treated and minimized with regular visits to a health professional, appropriate monitoring and medication, and a healthy diet and lifestyle.     Bad carbs also include fruit juice, alcohol, and sweet tea. These are empty calories that do not signal to your brain that you are full.   Please remember the good carbs are still carbs which convert into sugar. So please measure them out no more than 1/2-1 cup of rice, oatmeal, pasta, and beans  Veggies are however free foods! Pile them on.   Not all fruit is created equal. Please see the list below, the fruit at the bottom is higher in sugars than the fruit at the top. Please avoid all dried fruits.     We want weight loss that will last so you should lose 1-2 pounds a week.  THAT IS IT! Please pick THREE things a month to change. Once it is a habit check off the item. Then pick another three items off the list to become habits.  If you are already doing a habit on the list GREAT!  Cross that item off! o Don't drink your calories. Ie, alcohol, soda, fruit juice, and sweet tea.  o Drink more water. Drink a glass when you feel hungry or  before each meal.  o Eat breakfast - Complex carb and protein (likeDannon light and fit yogurt, oatmeal, fruit, eggs, [place] bacon). o Measure your cereal.  Eat no more than one cup a day. (ie [place]) o Eat an apple a day. o Add a vegetable a day. o Try a new vegetable a month. o Use Pam! Stop using oil or butter to cook. o Don't finish your plate or use smaller plates. o Share your dessert. o Eat sugar free Jello for dessert or frozen grapes. o Don't eat 2-3 hours before bed. o Switch to whole wheat bread, pasta, and brown rice. o Make healthier choices when you eat out. No fries!  o Pick baked chicken, NOT fried. o Don't forget to SLOW DOWN when you eat. It is not going anywhere.  o Take the stairs. o Park far away in the parking lot o [employer] (or weights) for 10 minutes while watching TV. o Walk at work for 10 minutes during break. o Walk outside 1 time a week with your friend, kids, dog, or significant other. o Start a walking group at [place] the mall as much as you can tolerate.  o Keep a food diary. o Weigh yourself daily. o Walk for 15 minutes 3 days per week. o Cook at home more often and eat out less.  If life happens and you go back to old habits, it is okay.  Just start over. You can do it!   If you experience chest pain, get short of breath, or tired during the exercise, please stop immediately and inform your doctor.   Before you even begin to attack a weight-loss plan, it pays to remember this: You are not fat. You have fat. Losing weight isn't about blame or shame; it's simply another achievement to accomplish. Dieting is like any other skill-you have to buckle down and work at it. As long as you act in a smart, reasonable way, you'll ultimately get where you want to be. Here are some weight loss pearls for you.  1. It's Not a Diet. It's a Lifestyle Thinking of a diet as something you're on and suffering through only for the short term doesn't work. To shed  weight and keep it off, you need to make permanent changes to the way you eat. It's OK to indulge occasionally, of course, but if you cut calories temporarily and then revert to your old way of eating, you'll gain back the weight quicker than you can say yo-yo. Use it to lose it. Research shows that one of the best predictors of long-term weight loss is how many pounds you drop in the first month. For that reason, nutritionists often suggest being stricter for the first two weeks of your new eating strategy to build momentum. Cut out added sugar and alcohol and avoid unrefined carbs. After that, figure out how you can reincorporate them in a way that's healthy and maintainable.  2. There's a Right Way to Exercise Working out burns calories and fat and boosts your metabolism by building muscle. But those trying to lose weight are notorious for overestimating the number of calories they burn and underestimating the amount they take in. Unfortunately, your system is biologically programmed to hold on to extra pounds and that means when you start exercising, your body senses the deficit and ramps up its hunger signals. If you're not diligent, you'll eat everything you burn and then some. Use it to lose it. Cardio gets all the exercise glory, but strength and interval training are the real heroes. They help you build lean muscle, which in turn increases your metabolism and calorie-burning ability 3. Don't Overreact to Mild Hunger Some people have a hard time losing weight because of hunger anxiety. To them, being hungry is bad-something to be avoided at all costs-so they carry snacks with them and eat when they don't need to. Others eat because they're stressed out or bored. While you never want to get to the point of being ravenous (that's when bingeing is likely to happen), a hunger pang, a craving, or the fact that it's 3:00 p.m. should not send you racing for the vending machine  or obsessing about the energy  bar in your purse. Ideally, you should put off eating until your stomach is growling and it's difficult to concentrate.  Use it to lose it. When you feel the urge to eat, use the HALT method. Ask yourself, Am I really hungry? Or am I angry or anxious, lonely or bored, or tired? If you're still not certain, try the apple test. If you're truly hungry, an apple should seem delicious; if it doesn't, something else is going on. Or you can try drinking water and making yourself busy, if you are still hungry try a healthy snack.  4. Not All Calories Are Created Equal The mechanics of weight loss are pretty simple: Take in fewer calories than you use for energy. But the kind of food you eat makes all the difference. Processed food that's high in saturated fat and refined starch or sugar can cause inflammation that disrupts the hormone signals that tell your brain you're full. The result: You eat a lot more.  Use it to lose it. Clean up your diet. Swap in whole, unprocessed foods, including vegetables, lean protein, and healthy fats that will fill you up and give you the biggest nutritional bang for your calorie buck. In a few weeks, as your brain starts receiving regular hunger and fullness signals once again, you'll notice that you feel less hungry overall and naturally start cutting back on the amount you eat.  5. Protein, Produce, and Plant-Based Fats Are Your Weight-Loss Trinity Here's why eating the three Ps regularly will help you drop pounds. Protein fills you up. You need it to build lean muscle, which keeps your metabolism humming so that you can torch more fat. People in a weight-loss program who ate double the recommended daily allowance for protein (about 110 grams for a 150-pound woman) lost 70 percent of their weight from fat, while people who ate the RDA lost only about 40 percent, one study found. Produce is packed with filling fiber. "It's very difficult to consume too many calories if you're eating  a lot of vegetables. Example: Three cups of broccoli is a lot of food, yet only 93 calories. (Fruit is another story. It can be easy to overeat and can contain a lot of calories from sugar, so be sure to monitor your intake.) Plant-based fats like olive oil and those in avocados and nuts are healthy and extra satiating.  Use it to lose it. Aim to incorporate each of the three Ps into every meal and snack. People who eat protein throughout the day are able to keep weight off, according to a study in the [employer] of Clinical Nutrition. In addition to meat, poultry and seafood, good sources are beans, lentils, eggs, tofu, and yogurt. As for fat, keep portion sizes in check by measuring out salad dressing, oil, and nut butters (shoot for one to two tablespoons). Finally, eat veggies or a little fruit at every meal. People who did that consumed 308 fewer calories but didn't feel any hungrier than when they didn't eat more produce.  7. How You Eat Is As Important As What You Eat In order for your brain to register that you're full, you need to focus on what you're eating. Sit down whenever you eat, preferably at a table. Turn off the TV or computer, put down your phone, and look at your food. Smell it. Chew slowly, and don't put another bite on your fork until you swallow. When women ate lunch this attentively,  they consumed 30 percent less when snacking later than those who listened to an audiobook at lunchtime, according to a study in the [employer] of Nutrition. 8. Weighing Yourself Really Works The scale provides the best evidence about whether your efforts are paying off. Seeing the numbers tick up or down or stagnate is motivation to keep going-or to rethink your approach. A 2015 study at [facility] found that daily weigh-ins helped people lose more weight, keep it off, and maintain that loss, even after two years. Use it to lose it. Step on the scale at the same time every day for the  best results. If your weight shoots up several pounds from one weigh-in to the next, don't freak out. Eating a lot of salt the night before or having your period is the likely culprit. The number should return to normal in a day or two. It's a steady climb that you need to do something about. 9. Too Much Stress and Too Little Sleep Are Your Enemies When you're tired and frazzled, your body cranks up the production of cortisol, the stress hormone that can cause carb cravings. Not getting enough sleep also boosts your levels of ghrelin, a hormone associated with hunger, while suppressing leptin, a hormone that signals fullness and satiety. People on a diet who slept only five and a half hours a night for two weeks lost 55 percent less fat and were hungrier than those who slept eight and a half hours, according to a study in the [place]. Use it to lose it. Prioritize sleep, aiming for seven hours or more a night, which research shows helps lower stress. And make sure you're getting quality zzz's. If a snoring spouse or a fidgety cat wakes you up frequently throughout the night, you may end up getting the equivalent of just four hours of sleep, according to a study from [facility]. Keep pets out of the bedroom, and use a white-noise app to drown out snoring. 10. You Will Hit a plateau-And You Can Bust Through It As you slim down, your body releases much less leptin, the fullness hormone.  If you're not strength training, start right now. Building muscle can raise your metabolism to help you overcome a plateau. To keep your body challenged and burning calories, incorporate new moves and more intense intervals into your workouts or add another sweat session to your weekly routine. Alternatively, cut an extra 100 calories or so a day from your diet. Now that you've lost weight, your body simply doesn't need as much fuel.   Ways to cut 100 calories  1. Eat your eggs with hot  sauce OR salsa instead of cheese.  Eggs are great for breakfast, but many people consider eggs and cheese to be BFFs. Instead of cheese-1 oz. of cheddar has 114 calories-top your eggs with hot sauce, which contains no calories and helps with satiety and metabolism. Salsa is also a great option!!  2. Top your toast, waffles or pancakes with mashed berries instead of jelly or syrup. Half a cup of berries-fresh, frozen or thawed-has about 40 calories, compared with 2 tbsp. of maple syrup or jelly, which both have about 100 calories. The berries will also give you a good punch of fiber, which helps keep you full and satisfied and won't spike blood sugar quickly like the jelly or syrup. 3. Swap the non-fat latte for black coffee with a splash of half-and-half. Contrary to its name, that non-fat latte has  130 calories and a startling 19g of carbohydrates per 16 oz. serving. Replacing that 'light' drinkable dessert with a black coffee with a splash of half-and-half saves you more than 100 calories per 16 oz. serving. 4. Sprinkle salads with freeze-dried raspberries instead of dried cranberries. If you want a sweet addition to your nutritious salad, stay away from dried cranberries. They have a whopping 130 calories per  cup and 30g carbohydrates. Instead, sprinkle freeze-dried raspberries guilt-free and save more than 100 calories per  cup serving, adding 3g of belly-filling fiber. 5. Go for mustard in place of mayo on your sandwich. Mustard can add really nice flavor to any sandwich, and there are tons of varieties, from spicy to honey. A serving of mayo is 95 calories, versus 10 calories in a serving of mustard. 6. Choose a DIY salad dressing instead of the store-bought kind. Mix Dijon or whole grain mustard with low-fat Kefir or red wine vinegar and garlic. 7. Use hummus as a spread instead of a dip. Use hummus as a spread on a high-fiber cracker or tortilla with a sandwich and save on calories without  sacrificing taste. 8. Pick just one salad "accessory." Salad isn't automatically a calorie winner. It's easy to over-accessorize with toppings. Instead of topping your salad with nuts, avocado and cranberries (all three will clock in at 313 calories), just pick one. The next day, choose a different accessory, which will also keep your salad interesting. You don't wear all your jewelry every day, right? 9. Ditch the white pasta in favor of spaghetti squash. One cup of cooked spaghetti squash has about 40 calories, compared with traditional spaghetti, which comes with more than 200. Spaghetti squash is also nutrient-dense. It's a good source of fiber and Vitamins A and C, and it can be eaten just like you would eat pasta-with a great tomato sauce and [place] meatballs or with pesto, tofu and spinach, for example. 10. Dress up your chili, soups and stews with non-fat [place] yogurt instead of sour cream. Just a 'dollop' of sour cream can set you back 115 calories and a whopping 12g of fat-seven of which are of the artery-clogging variety. Added bonus: [place] yogurt is packed with muscle-building protein, calcium and B Vitamins. 11. Mash cauliflower instead of mashed potatoes. One cup of traditional mashed potatoes-in all their creamy goodness-has more than 200 calories, compared to mashed cauliflower, which you can typically eat for less than 100 calories per 1 cup serving. Cauliflower is a great source of the antioxidant indole-3-carbinol (I3C), which may help reduce the risk of some cancers, like breast cancer. 12. Ditch the ice cream sundae in favor of a [place] yogurt parfait. Instead of a cup of ice cream or fro-yo for dessert, try 1 cup of nonfat Greek yogurt topped with fresh berries and a sprinkle of cacao nibs. Both toppings are packed with antioxidants, which can help reduce cellular inflammation and oxidative damage. And the comparison is a no-brainer: One cup of ice cream has about 275 calories; one cup  of frozen yogurt has about 230; and a cup of Greek yogurt has just 130, plus twice the protein, so you're less likely to return to the freezer for a second helping. 13. Put olive oil in a spray container instead of using it directly from the bottle. Each tablespoon of olive oil is 120 calories and 15g of fat. Use a mister instead of pouring it straight into the pan or onto a salad. This allows for portion control and  will save you more than 100 calories. 14. When baking, substitute canned pumpkin for butter or oil. Canned pumpkin-not pumpkin pie mix-is loaded with Vitamin A, which is important for skin and eye health, as well as immunity. And the comparisons are pretty crazy:  cup of canned pumpkin has about 40 calories, compared to butter or oil, which has more than 800 calories. Yes, 800 calories. Applesauce and mashed banana can also serve as good substitutions for butter or oil, usually in a 1:1 ratio. 15. Top casseroles with high-fiber cereal instead of breadcrumbs. Breadcrumbs are typically made with white bread, while breakfast cereals contain 5-9g of fiber per serving. Not only will you save more than 150 calories per  cup serving, the swap will also keep you more full and you'll get a metabolism boost from the added fiber. 16. Snack on pistachios instead of macadamia nuts. Believe it or not, you get the same amount of calories from 35 pistachios (100 calories) as you would from only five macadamia nuts. 17. Chow down on kale chips rather than potato chips. This is my favorite 'don't knock it 'till you try it' swap. Kale chips are so easy to make at home, and you can spice them up with a little grated parmesan or chili powder. Plus, they're a mere fraction of the calories of potato chips, but with the same crunch factor we crave so often. 18. Add seltzer and some fruit slices to your cocktail instead of soda or fruit juice. One cup of soda or fruit juice can pack on as much as 140 calories.  Instead, use seltzer and fruit slices. The fruit provides valuable phytochemicals, such as flavonoids and anthocyanins, which help to combat cancer and stave off the aging process.  Marland Kitchen

## 2014-06-21 NOTE — Progress Notes (Signed)
Assessment and Plan:  1. Hypertension -Continue medication, monitor blood pressure at home. Continue DASH diet.  Reminder to go to the ER if any CP, SOB, nausea, dizziness, severe HA, changes vision/speech, left arm numbness and tingling and jaw pain.  2. Cholesterol -Continue diet and exercise. Check cholesterol.   3. Diabetes with diabetic chronic kidney disease -Continue diet and exercise. Check A1C Long discussion about weight loss, willing to restart metformin and willing to try invokana, will recheck BMP 1 month Add bASA and continue ACE  4. Vitamin D Def - check level and continue medications.   5. Obesity with co morbidities-  long discussion about weight loss, diet, and exercise   Continue diet and meds as discussed. Further disposition pending results of labs. Discussed med's effects and SE's.    Over 30 minutes of exam, counseling, chart review, and critical decision making was performed   HPI 59 y.o. female  presents for over due 3 month follow up on hypertension, cholesterol, diabetes and vitamin D deficiency.   Her blood pressure has been controlled at home, today her BP is BP: 138/80 mmHg.  She does workout, started to walk around the building at work 1-2 x a day, keeps care of the chickens. She denies chest pain, shortness of breath, dizziness. She does smoke but has stopped smoking in the house.   She is not on cholesterol medication and denies myalgias. Her cholesterol is at goal. 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 Last A1C was:   Lab Results  Component Value Date   HGBA1C 6.9* 01/07/2014  BMI is Body mass index is 32.26 kg/(m^2)., she is working on diet and exercise. Wt Readings from Last 3 Encounters:  06/21/14 206 lb (93.441 kg)  02/02/14 206 lb (93.441 kg)  01/07/14 209 lb (94.802 kg)  Patient is on Vitamin D supplement. She is on xanax for anxiety PRN. Her husband has AVR and chronic pain from severe spine stenosis.   Current Medications:  Current Outpatient Prescriptions on File Prior to Visit  Medication Sig Dispense Refill  . ALPRAZolam (XANAX) 0.25 MG tablet TAKE 1 TABLET BY MOUTH TWICE A DAY 60 tablet 0  . amLODipine-benazepril (LOTREL) 10-40 MG per capsule Take 1 capsule by mouth daily. 30 capsule 0  . hydrochlorothiazide (MICROZIDE) 12.5 MG capsule Take 1 capsule (12.5 mg total) by mouth daily. 30 capsule 3  . linagliptin (TRADJENTA) 5 MG TABS tablet Take 1 tablet (5 mg total) by mouth daily. 21 tablet 0   No current facility-administered medications on file prior to visit.   Medical History:  Past Medical History  Diagnosis Date  . Hypertension   . Hyperlipidemia   . Vitamin D deficiency   . Right bundle branch block 05/2005  . Asthma   . Anemia   . Depression   . Diabetes mellitus without complication    Allergies:  Allergies  Allergen Reactions  . Tylox [Oxycodone-Acetaminophen]     Review of Systems:  Review of Systems  Constitutional: Negative.  HENT: Negative.   Eyes: Negative.   Respiratory: Negative.   Cardiovascular: Negative.   Gastrointestinal: Negative.   Genitourinary: Negative.   Musculoskeletal: Negative.   Skin: Negative.   Neurological: Negative.   Endo/Heme/Allergies: Negative.   Psychiatric/Behavioral: Negative.     Family history- Review and unchanged Social history- Review and unchanged Physical Exam: BP 138/80 mmHg  Pulse 72  Temp(Src) 97.9 F (36.6 C)  Resp 16  Ht 5\' 7"  (1.702 m)  Wt 206 lb (93.441 kg)  BMI 32.26 kg/m2 Wt Readings from Last 3  Encounters:  06/21/14 206 lb (93.441 kg)  02/02/14 206 lb (93.441 kg)  01/07/14 209 lb (94.802 kg)   General Appearance: Well nourished, in no apparent distress. Eyes: PERRLA, EOMs, conjunctiva no swelling or erythema Sinuses: No Frontal/maxillary tenderness ENT/Mouth: Ext aud canals clear, TMs without erythema, bulging. No erythema, swelling, or exudate on post pharynx.  Tonsils not swollen or erythematous. Hearing normal.  Neck: Supple, thyroid normal.  Respiratory: Respiratory effort normal, BS equal bilaterally without rales, rhonchi, wheezing or stridor.  Cardio: RRR with no MRGs. Brisk peripheral pulses without edema.  Abdomen: Soft, + BS.  Non tender, no guarding, rebound, hernias, masses. Lymphatics: Non tender without lymphadenopathy.  Musculoskeletal: Full ROM, 5/5 strength, Normal gait Skin: Warm, dry without rashes, lesions, ecchymosis.  Neuro: Cranial nerves intact. No cerebellar symptoms.  Psych: Awake and oriented X 3, normal affect, Insight and Judgment appropriate.    Vicie Mutters, PA-C 4:17 PM [facility]

## 2014-06-22 LAB — LIPID PANEL
CHOL/HDL RATIO: 5.8 ratio
Cholesterol: 186 mg/dL (ref 0–200)
HDL: 32 mg/dL — AB (ref >=46)
LDL CALC: 94 mg/dL (ref 0–99)
TRIGLYCERIDES: 300 mg/dL — AB (ref <150)
VLDL: 60 mg/dL — AB (ref 0–40)

## 2014-06-22 LAB — BASIC METABOLIC PANEL WITH GFR
BUN: 11 mg/dL (ref 6–23)
CHLORIDE: 105 meq/L (ref 96–112)
CO2: 26 meq/L (ref 19–32)
Calcium: 9.6 mg/dL (ref 8.4–10.5)
Creat: 0.93 mg/dL (ref 0.50–1.10)
GFR, Est African American: 77 mL/min
GFR, Est Non African American: 67 mL/min
Glucose, Bld: 140 mg/dL — ABNORMAL HIGH (ref 70–99)
POTASSIUM: 4.2 meq/L (ref 3.5–5.3)
Sodium: 139 mEq/L (ref 135–145)

## 2014-06-22 LAB — HEPATIC FUNCTION PANEL
ALT: 11 U/L (ref 0–35)
AST: 12 U/L (ref 0–37)
Albumin: 4.2 g/dL (ref 3.5–5.2)
Alkaline Phosphatase: 73 U/L (ref 39–117)
BILIRUBIN TOTAL: 0.3 mg/dL (ref 0.2–1.2)
Bilirubin, Direct: <0.1 mg/dL (ref 0.0–0.3)
Total Protein: 7.2 g/dL (ref 6.0–8.3)

## 2014-06-22 LAB — INSULIN, FASTING: INSULIN FASTING, SERUM: 14.5 u[IU]/mL (ref 2.0–19.6)

## 2014-06-22 LAB — VITAMIN D 25 HYDROXY (VIT D DEFICIENCY, FRACTURES): VIT D 25 HYDROXY: 24 ng/mL — AB (ref 30–100)

## 2014-06-22 LAB — TSH: TSH: 2.871 u[IU]/mL (ref 0.350–4.500)

## 2014-06-22 LAB — MAGNESIUM: MAGNESIUM: 2.1 mg/dL (ref 1.5–2.5)

## 2014-07-13 ENCOUNTER — Other Ambulatory Visit: Payer: Self-pay | Admitting: Internal Medicine

## 2014-07-22 ENCOUNTER — Other Ambulatory Visit: Payer: Commercial Managed Care - HMO

## 2014-07-22 DIAGNOSIS — E1165 Type 2 diabetes mellitus with hyperglycemia: Secondary | ICD-10-CM

## 2014-07-22 DIAGNOSIS — IMO0002 Reserved for concepts with insufficient information to code with codable children: Secondary | ICD-10-CM

## 2014-07-22 LAB — BASIC METABOLIC PANEL WITH GFR
BUN: 13 mg/dL (ref 6–23)
CALCIUM: 9.5 mg/dL (ref 8.4–10.5)
CO2: 21 mEq/L (ref 19–32)
CREATININE: 0.85 mg/dL (ref 0.50–1.10)
Chloride: 106 mEq/L (ref 96–112)
GFR, EST AFRICAN AMERICAN: 86 mL/min
GFR, EST NON AFRICAN AMERICAN: 75 mL/min
Glucose, Bld: 161 mg/dL — ABNORMAL HIGH (ref 70–99)
POTASSIUM: 4 meq/L (ref 3.5–5.3)
SODIUM: 138 meq/L (ref 135–145)

## 2014-08-02 ENCOUNTER — Other Ambulatory Visit: Payer: Self-pay

## 2014-09-22 ENCOUNTER — Ambulatory Visit (INDEPENDENT_AMBULATORY_CARE_PROVIDER_SITE_OTHER): Payer: Commercial Managed Care - HMO | Admitting: Physician Assistant

## 2014-09-22 VITALS — BP 136/82 | HR 68 | Temp 97.5°F | Resp 16 | Ht 67.0 in | Wt 196.6 lb

## 2014-09-22 DIAGNOSIS — Z79899 Other long term (current) drug therapy: Secondary | ICD-10-CM

## 2014-09-22 DIAGNOSIS — E669 Obesity, unspecified: Secondary | ICD-10-CM | POA: Diagnosis not present

## 2014-09-22 DIAGNOSIS — Z1211 Encounter for screening for malignant neoplasm of colon: Secondary | ICD-10-CM

## 2014-09-22 DIAGNOSIS — E1165 Type 2 diabetes mellitus with hyperglycemia: Secondary | ICD-10-CM | POA: Diagnosis not present

## 2014-09-22 DIAGNOSIS — I10 Essential (primary) hypertension: Secondary | ICD-10-CM | POA: Diagnosis not present

## 2014-09-22 DIAGNOSIS — E559 Vitamin D deficiency, unspecified: Secondary | ICD-10-CM

## 2014-09-22 DIAGNOSIS — E785 Hyperlipidemia, unspecified: Secondary | ICD-10-CM | POA: Diagnosis not present

## 2014-09-22 DIAGNOSIS — Z720 Tobacco use: Secondary | ICD-10-CM

## 2014-09-22 DIAGNOSIS — IMO0002 Reserved for concepts with insufficient information to code with codable children: Secondary | ICD-10-CM

## 2014-09-22 MED ORDER — ALPRAZOLAM 0.25 MG PO TABS: 0.2500 mg | ORAL_TABLET | Freq: Two times a day (BID) | ORAL | Status: DC

## 2014-09-22 NOTE — Patient Instructions (Addendum)
[facility]  7 a.m.-6:30 p.m., Monday 7 a.m.-5 p.m., Tuesday-Friday Schedule an appointment by calling [phone].  [facility] Schedule an appointment by calling [phone].  Encourage you to get the 3D Mammogram  The 3D Mammogram is much more specific and sensitive to pick up breast cancer. For women with fibrocystic breast or lumpy breast it can be hard to determine if it is cancer or not but the 3D mammogram is able to tell this difference which cuts back on unneeded additional tests or scary call backs.   - over 40% increase in detection of breast cancer - over 40% reduction in false positives.  - fewer call backs - reduced anxiety - improved outcomes - [facility]  Add ENTERIC COATED low dose 81 mg Aspirin daily OR can do every other day if you have easy bruising to protect your heart and head. As well as to reduce risk of Colon Cancer by 20 %, Skin Cancer by 26 % , Melanoma by 46% and Pancreatic cancer by 60%   Vitamin D goal is between 60-80  Please make sure that you are taking your Vitamin D as directed.   It is very important as a natural anti-inflammatory   helping hair, skin, and nails, as well as reducing stroke and heart attack risk.   It helps your bones and helps with mood.  It also decreases numerous cancer risks so please take it as directed.   Low Vit D is associated with a 200-300% higher risk for CANCER   and 200-300% higher risk for HEART   ATTACK  &  STROKE.    .....................................Marland Kitchen  It is also associated with higher death rate at younger ages,   autoimmune diseases like Rheumatoid arthritis, Lupus, Multiple Sclerosis.     Also many other serious conditions, like depression, Alzheimer's  Dementia, infertility, muscle aches, fatigue, fibromyalgia - just to name a few.  +++++++++++++++++++   Before you even begin to attack a weight-loss plan, it pays to remember this: You are not fat.  You have fat. Losing weight isn't about blame or shame; it's simply another achievement to accomplish. Dieting is like any other skill-you have to buckle down and work at it. As long as you act in a smart, reasonable way, you'll ultimately get where you want to be. Here are some weight loss pearls for you.  1. It's Not a Diet. It's a Lifestyle Thinking of a diet as something you're on and suffering through only for the short term doesn't work. To shed weight and keep it off, you need to make permanent changes to the way you eat. It's OK to indulge occasionally, of course, but if you cut calories temporarily and then revert to your old way of eating, you'll gain back the weight quicker than you can say yo-yo. Use it to lose it. Research shows that one of the best predictors of long-term weight loss is how many pounds you drop in the first month. For that reason, nutritionists often suggest being stricter for the first two weeks of your new eating strategy to build momentum. Cut out added sugar and alcohol and avoid unrefined carbs. After that, figure out how you can reincorporate them in a way that's healthy and maintainable.  2. There's a Right Way to Exercise Working out burns calories and fat and boosts your metabolism by building muscle. But those trying to lose weight are notorious for overestimating the number of calories they burn and  underestimating the amount they take in. Unfortunately, your system is biologically programmed to hold on to extra pounds and that means when you start exercising, your body senses the deficit and ramps up its hunger signals. If you're not diligent, you'll eat everything you burn and then some. Use it to lose it. Cardio gets all the exercise glory, but strength and interval training are the real heroes. They help you build lean muscle, which in turn increases your metabolism and calorie-burning ability 3. Don't Overreact to Mild Hunger Some people have a hard time losing  weight because of hunger anxiety. To them, being hungry is bad-something to be avoided at all costs-so they carry snacks with them and eat when they don't need to. Others eat because they're stressed out or bored. While you never want to get to the point of being ravenous (that's when bingeing is likely to happen), a hunger pang, a craving, or the fact that it's 3:00 p.m. should not send you racing for the vending machine or obsessing about the energy bar in your purse. Ideally, you should put off eating until your stomach is growling and it's difficult to concentrate.  Use it to lose it. When you feel the urge to eat, use the HALT method. Ask yourself, Am I really hungry? Or am I angry or anxious, lonely or bored, or tired? If you're still not certain, try the apple test. If you're truly hungry, an apple should seem delicious; if it doesn't, something else is going on. Or you can try drinking water and making yourself busy, if you are still hungry try a healthy snack.  4. Not All Calories Are Created Equal The mechanics of weight loss are pretty simple: Take in fewer calories than you use for energy. But the kind of food you eat makes all the difference. Processed food that's high in saturated fat and refined starch or sugar can cause inflammation that disrupts the hormone signals that tell your brain you're full. The result: You eat a lot more.  Use it to lose it. Clean up your diet. Swap in whole, unprocessed foods, including vegetables, lean protein, and healthy fats that will fill you up and give you the biggest nutritional bang for your calorie buck. In a few weeks, as your brain starts receiving regular hunger and fullness signals once again, you'll notice that you feel less hungry overall and naturally start cutting back on the amount you eat.  5. Protein, Produce, and Plant-Based Fats Are Your Weight-Loss Trinity Here's why eating the three Ps regularly will help you drop pounds. Protein fills you  up. You need it to build lean muscle, which keeps your metabolism humming so that you can torch more fat. People in a weight-loss program who ate double the recommended daily allowance for protein (about 110 grams for a 150-pound woman) lost 70 percent of their weight from fat, while people who ate the RDA lost only about 40 percent, one study found. Produce is packed with filling fiber. "It's very difficult to consume too many calories if you're eating a lot of vegetables. Example: Three cups of broccoli is a lot of food, yet only 93 calories. (Fruit is another story. It can be easy to overeat and can contain a lot of calories from sugar, so be sure to monitor your intake.) Plant-based fats like olive oil and those in avocados and nuts are healthy and extra satiating.  Use it to lose it. Aim to incorporate each of the three Ps  into every meal and snack. People who eat protein throughout the day are able to keep weight off, according to a study in the [place] of Clinical Nutrition. In addition to meat, poultry and seafood, good sources are beans, lentils, eggs, tofu, and yogurt. As for fat, keep portion sizes in check by measuring out salad dressing, oil, and nut butters (shoot for one to two tablespoons). Finally, eat veggies or a little fruit at every meal. People who did that consumed 308 fewer calories but didn't feel any hungrier than when they didn't eat more produce.  7. How You Eat Is As Important As What You Eat In order for your brain to register that you're full, you need to focus on what you're eating. Sit down whenever you eat, preferably at a table. Turn off the TV or computer, put down your phone, and look at your food. Smell it. Chew slowly, and don't put another bite on your fork until you swallow. When women ate lunch this attentively, they consumed 30 percent less when snacking later than those who listened to an audiobook at lunchtime, according to a study in the [place] of  Nutrition. 8. Weighing Yourself Really Works The scale provides the best evidence about whether your efforts are paying off. Seeing the numbers tick up or down or stagnate is motivation to keep going-or to rethink your approach. A 2015 study at [facility] found that daily weigh-ins helped people lose more weight, keep it off, and maintain that loss, even after two years. Use it to lose it. Step on the scale at the same time every day for the best results. If your weight shoots up several pounds from one weigh-in to the next, don't freak out. Eating a lot of salt the night before or having your period is the likely culprit. The number should return to normal in a day or two. It's a steady climb that you need to do something about. 9. Too Much Stress and Too Little Sleep Are Your Enemies When you're tired and frazzled, your body cranks up the production of cortisol, the stress hormone that can cause carb cravings. Not getting enough sleep also boosts your levels of ghrelin, a hormone associated with hunger, while suppressing leptin, a hormone that signals fullness and satiety. People on a diet who slept only five and a half hours a night for two weeks lost 55 percent less fat and were hungrier than those who slept eight and a half hours, according to a study in the [place]. Use it to lose it. Prioritize sleep, aiming for seven hours or more a night, which research shows helps lower stress. And make sure you're getting quality zzz's. If a snoring spouse or a fidgety cat wakes you up frequently throughout the night, you may end up getting the equivalent of just four hours of sleep, according to a study from [facility]. Keep pets out of the bedroom, and use a white-noise app to drown out snoring. 10. You Will Hit a plateau-And You Can Bust Through It As you slim down, your body releases much less leptin, the fullness hormone.  If you're not strength training, start  right now. Building muscle can raise your metabolism to help you overcome a plateau. To keep your body challenged and burning calories, incorporate new moves and more intense intervals into your workouts or add another sweat session to your weekly routine. Alternatively, cut an extra 100 calories or so a day from  your diet. Now that you've lost weight, your body simply doesn't need as much fuel.   We want weight loss that will last so you should lose 1-2 pounds a week.  THAT IS IT! Please pick THREE things a month to change. Once it is a habit check off the item. Then pick another three items off the list to become habits.  If you are already doing a habit on the list GREAT!  Cross that item off! o Don't drink your calories. Ie, alcohol, soda, fruit juice, and sweet tea.  o Drink more water. Drink a glass when you feel hungry or before each meal.  o Eat breakfast - Complex carb and protein (likeDannon light and fit yogurt, oatmeal, fruit, eggs, [place] bacon). o Measure your cereal.  Eat no more than one cup a day. (ie [place]) o Eat an apple a day. o Add a vegetable a day. o Try a new vegetable a month. o Use Pam! Stop using oil or butter to cook. o Don't finish your plate or use smaller plates. o Share your dessert. o Eat sugar free Jello for dessert or frozen grapes. o Don't eat 2-3 hours before bed. o Switch to whole wheat bread, pasta, and brown rice. o Make healthier choices when you eat out. No fries! o Pick baked chicken, NOT fried. o Don't forget to SLOW DOWN when you eat. It is not going anywhere.  o Take the stairs. o Park far away in the parking lot o [employer] (or weights) for 10 minutes while watching TV. o Walk at work for 10 minutes during break. o Walk outside 1 time a week with your friend, kids, dog, or significant other. o Start a walking group at [place] the mall as much as you can tolerate.  o Keep a food diary. o Weigh yourself daily. o Walk for 15 minutes  3 days per week. o Cook at home more often and eat out less.  If life happens and you go back to old habits, it is okay.  Just start over. You can do it!   If you experience chest pain, get short of breath, or tired during the exercise, please stop immediately and inform your doctor.

## 2014-09-22 NOTE — Progress Notes (Signed)
Assessment and Plan:  1. Hypertension -Continue medication, monitor blood pressure at home. Continue DASH diet.  Reminder to go to the ER if any CP, SOB, nausea, dizziness, severe HA, changes vision/speech, left arm numbness and tingling and jaw pain.  2. Cholesterol -Continue diet and exercise. Check cholesterol.   3. Diabetes without complications -Continue diet and exercise. Check A1C Add BASA  4. Vitamin D Def - check level and continue medications.   5. Obesity with co morbidities - long discussion about weight loss, diet, and exercise  6. Preventative medicine Referral for colonoscopy, get MGM   Continue diet and meds as discussed. Further disposition pending results of labs. Discussed med's effects and SE's.   Over 30 minutes of exam, counseling, chart review, and critical decision making was performed   HPI 59 y.o. female  presents for 3 month follow up on hypertension, cholesterol, diabetes and vitamin D deficiency.   Her blood pressure has been controlled at home, today her BP is BP: 136/82 mmHg.  She does workout, active at home and walks at work. She denies chest pain, shortness of breath, dizziness.  She is not on cholesterol medication and denies myalgias. Her cholesterol is not at goal. The cholesterol was:   Lab Results  Component Value Date   CHOL 186 06/21/2014   HDL 32* 06/21/2014   LDLCALC 94 06/21/2014   TRIG 300* 06/21/2014   CHOLHDL 5.8 06/21/2014    She has been working on diet and exercise for diabetes with diabetic chronic kidney disease,  Lab Results  Component Value Date   GFRNONAA 75 07/22/2014   , she is not on bASA, she is on ACE/ARB, and denies  paresthesia of the feet, polydipsia, polyuria and visual disturbances. Last A1C was:  Lab Results  Component Value Date   HGBA1C 7.6* 06/21/2014   She had abscess tooth last week, saw Dr. Doreene Burke, has been on ABX and had removed.   Patient is not on Vitamin D supplement. Lab Results   Component Value Date   VD25OH 24* 06/21/2014   BMI is Body mass index is 30.78 kg/(m^2)., she is working on diet and exercise. Wt Readings from Last 3 Encounters:  09/22/14 196 lb 9.6 oz (89.177 kg)  06/21/14 206 lb (93.441 kg)  02/02/14 206 lb (93.441 kg)    Current Medications:  Current Outpatient Prescriptions on File Prior to Visit  Medication Sig Dispense Refill  . ALPRAZolam (XANAX) 0.25 MG tablet TAKE 1 TABLET BY MOUTH TWICE A DAY 60 tablet 0  . amLODipine-benazepril (LOTREL) 10-40 MG per capsule TAKE 1 CAPSULE BY MOUTH DAILY. 30 capsule 3  . canagliflozin (INVOKANA) 300 MG TABS tablet Take 300 mg by mouth daily before breakfast. 30 tablet PRN  . hydrochlorothiazide (MICROZIDE) 12.5 MG capsule Take 1 capsule (12.5 mg total) by mouth daily. 30 capsule 3  . metFORMIN (GLUCOPHAGE XR) 500 MG 24 hr tablet Take 3 tablets (1,500 mg total) by mouth 3 (three) times daily with meals. 90 tablet 3   No current facility-administered medications on file prior to visit.   Medical History:  Past Medical History  Diagnosis Date  . Hypertension   . Hyperlipidemia   . Vitamin D deficiency   . Right bundle branch block 05/2005  . Asthma   . Anemia   . Depression   . Diabetes mellitus without complication    Allergies:  Allergies  Allergen Reactions  . Tylox [Oxycodone-Acetaminophen]      Review of Systems:  Review of Systems  Constitutional: Negative.   HENT: Positive for congestion. Negative for ear discharge, ear pain, hearing loss, nosebleeds, sore throat and tinnitus.   Eyes: Negative.   Respiratory: Negative.  Negative for stridor.   Cardiovascular: Negative.   Gastrointestinal: Positive for constipation (with pain medicaitons from abscess tooth). Negative for heartburn, nausea, vomiting, abdominal pain, diarrhea, blood in stool and melena.  Genitourinary: Negative.   Musculoskeletal: Negative.   Skin: Negative.   Neurological: Positive for headaches. Negative for  dizziness, tingling, tremors, sensory change, speech change, focal weakness, seizures and loss of consciousness.  Psychiatric/Behavioral: Negative.     Family history- Review and unchanged Social history- Review and unchanged Physical Exam: BP 136/82 mmHg  Pulse 68  Temp(Src) 97.5 F (36.4 C)  Resp 16  Ht 5\' 7"  (1.702 m)  Wt 196 lb 9.6 oz (89.177 kg)  BMI 30.78 kg/m2 Wt Readings from Last 3 Encounters:  09/22/14 196 lb 9.6 oz (89.177 kg)  06/21/14 206 lb (93.441 kg)  02/02/14 206 lb (93.441 kg)   General Appearance: Well nourished, in no apparent distress. Eyes: PERRLA, EOMs, conjunctiva no swelling or erythema Sinuses: No Frontal/maxillary tenderness ENT/Mouth: Ext aud canals clear, TMs without erythema, bulging. No erythema, swelling, or exudate on post pharynx.  Tonsils not swollen or erythematous. Hearing normal.  Neck: Supple, thyroid normal.  Respiratory: Respiratory effort normal, BS equal bilaterally without rales, rhonchi, wheezing or stridor.  Cardio: RRR with no MRGs. Brisk peripheral pulses without edema.  Abdomen: Soft, + BS.  Non tender, no guarding, rebound, hernias, masses. Lymphatics: Non tender without lymphadenopathy.  Musculoskeletal: Full ROM, 5/5 strength, Normal gait Skin: Warm, dry without rashes, lesions, ecchymosis.  Neuro: Cranial nerves intact. No cerebellar symptoms.  Psych: Awake and oriented X 3, normal affect, Insight and Judgment appropriate.    Vicie Mutters, PA-C 3:39 PM [facility]

## 2014-09-23 LAB — CBC WITH DIFFERENTIAL/PLATELET
Basophils Absolute: 0 10*3/uL (ref 0.0–0.1)
Basophils Relative: 0 % (ref 0–1)
EOS ABS: 0.2 10*3/uL (ref 0.0–0.7)
EOS PCT: 2 % (ref 0–5)
HCT: 42.3 % (ref 36.0–46.0)
Hemoglobin: 14.3 g/dL (ref 12.0–15.0)
Lymphocytes Relative: 32 % (ref 12–46)
Lymphs Abs: 3.3 10*3/uL (ref 0.7–4.0)
MCH: 30.7 pg (ref 26.0–34.0)
MCHC: 33.8 g/dL (ref 30.0–36.0)
MCV: 90.8 fL (ref 78.0–100.0)
MONOS PCT: 9 % (ref 3–12)
MPV: 9 fL (ref 8.6–12.4)
Monocytes Absolute: 0.9 10*3/uL (ref 0.1–1.0)
NEUTROS PCT: 57 % (ref 43–77)
Neutro Abs: 5.9 10*3/uL (ref 1.7–7.7)
PLATELETS: 262 10*3/uL (ref 150–400)
RBC: 4.66 MIL/uL (ref 3.87–5.11)
RDW: 13.5 % (ref 11.5–15.5)
WBC: 10.4 10*3/uL (ref 4.0–10.5)

## 2014-09-23 LAB — BASIC METABOLIC PANEL WITH GFR
BUN: 11 mg/dL (ref 7–25)
CO2: 27 mmol/L (ref 20–31)
Calcium: 9.7 mg/dL (ref 8.6–10.4)
Chloride: 105 mmol/L (ref 98–110)
Creat: 0.74 mg/dL (ref 0.50–0.99)
GFR, Est African American: >89 mL/min (ref >=60)
GFR, Est Non African American: 88 mL/min (ref >=60)
GLUCOSE: 93 mg/dL (ref 65–99)
Potassium: 4.3 mmol/L (ref 3.5–5.3)
Sodium: 140 mmol/L (ref 135–146)

## 2014-09-23 LAB — LIPID PANEL
Cholesterol: 188 mg/dL (ref 125–200)
HDL: 35 mg/dL — ABNORMAL LOW (ref >=46)
LDL Cholesterol: 115 mg/dL (ref <130)
Total CHOL/HDL Ratio: 5.4 Ratio — ABNORMAL HIGH (ref <=5.0)
Triglycerides: 191 mg/dL — ABNORMAL HIGH (ref <150)
VLDL: 38 mg/dL — ABNORMAL HIGH (ref <30)

## 2014-09-23 LAB — TSH: TSH: 3.415 u[IU]/mL (ref 0.350–4.500)

## 2014-09-23 LAB — HEPATIC FUNCTION PANEL
ALBUMIN: 4.2 g/dL (ref 3.6–5.1)
ALT: 10 U/L (ref 6–29)
AST: 11 U/L (ref 10–35)
Alkaline Phosphatase: 64 U/L (ref 33–130)
BILIRUBIN TOTAL: 0.4 mg/dL (ref 0.2–1.2)
Bilirubin, Direct: 0.1 mg/dL (ref <=0.2)
Indirect Bilirubin: 0.3 mg/dL (ref 0.2–1.2)
TOTAL PROTEIN: 7.1 g/dL (ref 6.1–8.1)

## 2014-09-23 LAB — INSULIN, FASTING: Insulin fasting, serum: 6.8 u[IU]/mL (ref 2.0–19.6)

## 2014-09-23 LAB — MAGNESIUM: MAGNESIUM: 2.1 mg/dL (ref 1.5–2.5)

## 2014-09-23 LAB — HEMOGLOBIN A1C
Hgb A1c MFr Bld: 6.7 % — ABNORMAL HIGH (ref <5.7)
MEAN PLASMA GLUCOSE: 146 mg/dL — AB (ref <117)

## 2014-09-23 LAB — VITAMIN D 25 HYDROXY (VIT D DEFICIENCY, FRACTURES): VIT D 25 HYDROXY: 27 ng/mL — AB (ref 30–100)

## 2014-11-05 ENCOUNTER — Other Ambulatory Visit: Payer: Self-pay | Admitting: Internal Medicine

## 2014-11-18 ENCOUNTER — Other Ambulatory Visit: Payer: Self-pay | Admitting: Physician Assistant

## 2014-12-23 ENCOUNTER — Ambulatory Visit (INDEPENDENT_AMBULATORY_CARE_PROVIDER_SITE_OTHER): Payer: Commercial Managed Care - HMO | Admitting: Physician Assistant

## 2014-12-23 ENCOUNTER — Encounter: Payer: Self-pay | Admitting: Physician Assistant

## 2014-12-23 VITALS — BP 130/72 | HR 82 | Temp 97.3°F | Resp 16 | Ht 67.0 in | Wt 192.0 lb

## 2014-12-23 DIAGNOSIS — Z720 Tobacco use: Secondary | ICD-10-CM

## 2014-12-23 DIAGNOSIS — I10 Essential (primary) hypertension: Secondary | ICD-10-CM

## 2014-12-23 DIAGNOSIS — E559 Vitamin D deficiency, unspecified: Secondary | ICD-10-CM

## 2014-12-23 DIAGNOSIS — E1122 Type 2 diabetes mellitus with diabetic chronic kidney disease: Secondary | ICD-10-CM | POA: Diagnosis not present

## 2014-12-23 DIAGNOSIS — IMO0002 Reserved for concepts with insufficient information to code with codable children: Secondary | ICD-10-CM

## 2014-12-23 DIAGNOSIS — J0100 Acute maxillary sinusitis, unspecified: Secondary | ICD-10-CM

## 2014-12-23 DIAGNOSIS — E785 Hyperlipidemia, unspecified: Secondary | ICD-10-CM

## 2014-12-23 DIAGNOSIS — E669 Obesity, unspecified: Secondary | ICD-10-CM | POA: Diagnosis not present

## 2014-12-23 DIAGNOSIS — Z79899 Other long term (current) drug therapy: Secondary | ICD-10-CM

## 2014-12-23 DIAGNOSIS — E1165 Type 2 diabetes mellitus with hyperglycemia: Secondary | ICD-10-CM

## 2014-12-23 DIAGNOSIS — N182 Chronic kidney disease, stage 2 (mild): Secondary | ICD-10-CM

## 2014-12-23 LAB — CBC WITH DIFFERENTIAL/PLATELET
Basophils Absolute: 0 10*3/uL (ref 0.0–0.1)
Basophils Relative: 0 % (ref 0–1)
EOS PCT: 2 % (ref 0–5)
Eosinophils Absolute: 0.2 10*3/uL (ref 0.0–0.7)
HEMATOCRIT: 42.4 % (ref 36.0–46.0)
HEMOGLOBIN: 14.9 g/dL (ref 12.0–15.0)
LYMPHS ABS: 3.8 10*3/uL (ref 0.7–4.0)
LYMPHS PCT: 35 % (ref 12–46)
MCH: 31.3 pg (ref 26.0–34.0)
MCHC: 35.1 g/dL (ref 30.0–36.0)
MCV: 89.1 fL (ref 78.0–100.0)
MONO ABS: 0.9 10*3/uL (ref 0.1–1.0)
MPV: 9.2 fL (ref 8.6–12.4)
Monocytes Relative: 8 % (ref 3–12)
NEUTROS ABS: 5.9 10*3/uL (ref 1.7–7.7)
Neutrophils Relative %: 55 % (ref 43–77)
Platelets: 274 10*3/uL (ref 150–400)
RBC: 4.76 MIL/uL (ref 3.87–5.11)
RDW: 13.5 % (ref 11.5–15.5)
WBC: 10.8 10*3/uL — AB (ref 4.0–10.5)

## 2014-12-23 MED ORDER — AZITHROMYCIN 250 MG PO TABS: ORAL_TABLET | ORAL | Status: AC

## 2014-12-23 NOTE — Progress Notes (Signed)
Assessment and Plan:  1. Hypertension -Continue medication, monitor blood pressure at home. Continue DASH diet.  Reminder to go to the ER if any CP, SOB, nausea, dizziness, severe HA, changes vision/speech, left arm numbness and tingling and jaw pain.  2. Cholesterol -Continue diet and exercise. Check cholesterol.   3. Diabetes without complications -Continue diet and exercise. Check A1C Continue invokana and metformin  4. Vitamin D Def - check level and continue medications.   5. Obesity with co morbidities - long discussion about weight loss, diet, and exercise  6. Sinusitis  Get on allergy pill, if not better get on zpak.    Continue diet and meds as discussed. Further disposition pending results of labs. Discussed med's effects and SE's.   Over 30 minutes of exam, counseling, chart review, and critical decision making was performed   HPI 59 y.o. female  presents for 3 month follow up on hypertension, cholesterol, diabetes and vitamin D deficiency.   Her blood pressure has been controlled at home, today her BP is BP: 130/72 mmHg.  She does workout, active at home and walks at work. She denies chest pain, shortness of breath, dizziness. 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   She is not on cholesterol medication and denies myalgias. Her cholesterol is not at goal. The cholesterol was:   Lab Results  Component Value Date   CHOL 188 09/22/2014   HDL 35* 09/22/2014   LDLCALC 115 09/22/2014   TRIG 191* 09/22/2014   CHOLHDL 5.4* 09/22/2014   She has left ear and sinus pain with sinus drainage x 2 weeks, denies fever, chills.   She has been working on diet and exercise for diabetes with diabetic chronic kidney disease,  Lab Results  Component Value Date   GFRNONAA 88 09/22/2014   , she is on bASA, she is on ACE/ARB,she is invokana and metformin, and denies  paresthesia of the feet, polydipsia, polyuria and visual disturbances. Last A1C was:   Lab Results  Component Value Date   HGBA1C 6.7* 09/22/2014    Patient is not on Vitamin D supplement. Lab Results  Component Value Date   VD25OH 27* 09/22/2014   BMI is Body mass index is 30.06 kg/(m^2)., she is working on diet and exercise. Wt Readings from Last 3 Encounters:  12/23/14 192 lb (87.091 kg)  09/22/14 196 lb 9.6 oz (89.177 kg)  06/21/14 206 lb (93.441 kg)    Current Medications:  Current Outpatient Prescriptions on File Prior to Visit  Medication Sig Dispense Refill  . ALPRAZolam (XANAX) 0.25 MG tablet Take 1 tablet (0.25 mg total) by mouth 2 (two) times daily. 60 tablet 0  . amLODipine-benazepril (LOTREL) 10-40 MG capsule TAKE 1 CAPSULE BY MOUTH DAILY. 30 capsule 3  . canagliflozin (INVOKANA) 300 MG TABS tablet Take 300 mg by mouth daily before breakfast. 30 tablet PRN  . hydrochlorothiazide (MICROZIDE) 12.5 MG capsule Take 1 capsule (12.5 mg total) by mouth daily. 30 capsule 3  . metFORMIN (GLUCOPHAGE-XR) 500 MG 24 hr tablet TAKE 3 TABLETS (1,500 MG TOTAL) BY MOUTH 3 (THREE) TIMES DAILY WITH MEALS. 270 tablet 1   No current facility-administered medications on file prior to visit.   Medical History:  Past Medical History  Diagnosis Date  . Hypertension   . Hyperlipidemia   . Vitamin D deficiency   . Right bundle branch block 05/2005  . Asthma   . Anemia   . Depression   . Diabetes  mellitus without complication (HCC)    Allergies:  Allergies  Allergen Reactions  . Tylox [Oxycodone-Acetaminophen]      Review of Systems:  Review of Systems  Constitutional: Negative.   HENT: Positive for congestion. Negative for ear discharge, ear pain, hearing loss, nosebleeds, sore throat and tinnitus.   Eyes: Negative.   Respiratory: Negative.  Negative for stridor.   Cardiovascular: Negative.   Gastrointestinal: Positive for constipation. Negative for heartburn, nausea, vomiting, abdominal pain, diarrhea, blood in stool and melena.  Genitourinary: Negative.    Musculoskeletal: Negative.   Skin: Negative.   Neurological: Positive for headaches. Negative for dizziness, tingling, tremors, sensory change, speech change, focal weakness, seizures and loss of consciousness.  Psychiatric/Behavioral: Negative.     Family history- Review and unchanged Social history- Review and unchanged Physical Exam: BP 130/72 mmHg  Pulse 82  Temp(Src) 97.3 F (36.3 C) (Temporal)  Resp 16  Ht 5\' 7"  (1.702 m)  Wt 192 lb (87.091 kg)  BMI 30.06 kg/m2  SpO2 97% Wt Readings from Last 3 Encounters:  12/23/14 192 lb (87.091 kg)  09/22/14 196 lb 9.6 oz (89.177 kg)  06/21/14 206 lb (93.441 kg)   General Appearance: Well nourished, in no apparent distress. Eyes: PERRLA, EOMs, conjunctiva no swelling or erythema Sinuses: + Frontal/maxillary tenderness ENT/Mouth: Ext aud canals clear, TMs without erythema, bulging. No erythema, swelling, or exudate on post pharynx.  Tonsils not swollen or erythematous. Hearing normal.  Neck: Supple, thyroid normal.  Respiratory: Respiratory effort normal, BS equal bilaterally without rales, rhonchi, wheezing or stridor.  Cardio: RRR with no MRGs. Brisk peripheral pulses without edema.  Abdomen: Soft, + BS.  Non tender, no guarding, rebound, hernias, masses. Lymphatics: Non tender without lymphadenopathy.  Musculoskeletal: Full ROM, 5/5 strength, Normal gait Skin: Warm, dry without rashes, lesions, ecchymosis.  Neuro: Cranial nerves intact. No cerebellar symptoms.  Psych: Awake and oriented X 3, normal affect, Insight and Judgment appropriate.    Vicie Mutters, PA-C 4:24 PM [facility]

## 2014-12-23 NOTE — Patient Instructions (Addendum)
[facility]  7 a.m.-6:30 p.m., Monday 7 a.m.-5 p.m., Tuesday-Friday Schedule an appointment by calling [phone].  [facility] Schedule an appointment by calling [phone].  Sinusitis can be uncomfortable. People with sinusitis have congestion with yellow/green/gray discharge, sinus pain/pressure, pain around the eyes. Sinus infections almost ALWAYS stem from a viral infection and antibiotics don't work against a virus. Even when bacteria is responsible, the infections usually clear up on their own in a week or so.   PLEASE TRY TO DO OVER THE COUNTER TREATMENT AND PREDNISONE FOR 5-7 DAYS AND IF YOU ARE NOT GETTING BETTER OR GETTING WORSE THEN YOU CAN START ON AN ANTIBIOTIC GIVEN.  Can take the prednisone AT NIGHT WITH DINNER, it take 8-12 hours to start working so it will NOT affect your sleeping if you take it at night with your food!! Take two pills the first night and 1 or two pill the second night and then 1 pill the other nights.   Risk of antibiotic use: About 1 in 4 people who take antibiotics have side effects including stomach problems, dizziness, or rashes. Those problems clear up soon after stopping the drugs, but in rare cases antibiotics can cause severe allergic reaction. Over use of antibiotics also encourages the growth of bacteria that can't be controlled easily with drugs. That makes you more vunerable to antibiotic-resistant infections and undermines the benefits of antibiotics for others.   Waste of Money: Antibiotics often aren't very expensive, but any money spent on unnecessary drugs is money down the drain.   When are antibiotics needed? Only when symptoms last longer than a week.  Start to improve but then worsen again  -It can take up to 2 weeks to feel better.   -If you do not get better in 7-10 days (Have fever, facial pain, dental pain and swelling), then please call the office and it is now appropriate to start an  antibiotic.   -Please take Tylenol or Ibuprofen for pain. -Acetaminiphen 325mg  orally every 4-6 hours for pain.  Max: 10 per day -Ibuprofen 200mg  orally every 6-8 hours for pain.  Take with food to avoid ulcers.   Max 10 per day  Please pick one of the over the counter allergy medications below and take it once daily for allergies.  Claritin or loratadine cheapest but likely the weakest  Zyrtec or certizine at night because it can make you sleepy The strongest is allegra or fexafinadine  Cheapest at [employer], [employer], [employer]  -While drinking fluids, pinch and hold nose close and swallow.  This will help open up your eustachian tubes to drain the fluid behind your ear drums. -Try steam showers to open your nasal passages.   Drink lots of water to stay hydrated and to thin mucous.  Flonase/Nasonex is to help the inflammation.  Take 2 sprays in each nostril at bedtime.  Make sure you spray towards the outside of each nostril towards the outer corner of your eye, hold nose close and tilt head back.  This will help the medication get into your sinuses.  If you do not like this medication, then use saline nasal sprays same directions as above for Flonase. Stop the medication right away if you get blurring of your vision or nose bleeds.  Sinusitis Sinusitis is redness, soreness, and inflammation of the paranasal sinuses. Paranasal sinuses are air pockets within the bones of your face (beneath the eyes, the middle of the forehead, or above the eyes). In healthy  paranasal sinuses, mucus is able to drain out, and air is able to circulate through them by way of your nose. However, when your paranasal sinuses are inflamed, mucus and air can become trapped. This can allow bacteria and other germs to grow and cause infection. Sinusitis can develop quickly and last only a short time (acute) or continue over a long period (chronic). Sinusitis that lasts for more than 12 weeks is considered chronic.  CAUSES   Causes of sinusitis include: Allergies. Structural abnormalities, such as displacement of the cartilage that separates your nostrils (deviated septum), which can decrease the air flow through your nose and sinuses and affect sinus drainage. Functional abnormalities, such as when the small hairs (cilia) that line your sinuses and help remove mucus do not work properly or are not present. SIGNS AND SYMPTOMS  Symptoms of acute and chronic sinusitis are the same. The primary symptoms are pain and pressure around the affected sinuses. Other symptoms include: Upper toothache. Earache. Headache. Bad breath. Decreased sense of smell and taste. A cough, which worsens when you are lying flat. Fatigue. Fever. Thick drainage from your nose, which often is green and may contain pus (purulent). Swelling and warmth over the affected sinuses. DIAGNOSIS  Your health care provider will perform a physical exam. During the exam, your health care provider may: Look in your nose for signs of abnormal growths in your nostrils (nasal polyps).  Tap over the affected sinus to check for signs of infection. View the inside of your sinuses (endoscopy) using an imaging device that has a light attached (endoscope). If your health care provider suspects that you have chronic sinusitis, one or more of the following tests may be recommended: Allergy tests. Nasal culture. A sample of mucus is taken from your nose, sent to a lab, and screened for bacteria. Nasal cytology. A sample of mucus is taken from your nose and examined by your health care provider to determine if your sinusitis is related to an allergy. TREATMENT  Most cases of acute sinusitis are related to a viral infection and will resolve on their own within 10 days. Sometimes medicines are prescribed to help relieve symptoms (pain medicine, decongestants, nasal steroid sprays, or saline sprays).  However, for sinusitis related to a bacterial infection, your  health care provider will prescribe antibiotic medicines. These are medicines that will help kill the bacteria causing the infection.  Rarely, sinusitis is caused by a fungal infection. In theses cases, your health care provider will prescribe antifungal medicine. For some cases of chronic sinusitis, surgery is needed. Generally, these are cases in which sinusitis recurs more than 3 times per year, despite other treatments. HOME CARE INSTRUCTIONS  Drink plenty of water. Water helps thin the mucus so your sinuses can drain more easily. Use a humidifier. Inhale steam 3 to 4 times a day (for example, sit in the bathroom with the shower running). Apply a warm, moist washcloth to your face 3 to 4 times a day, or as directed by your health care provider. Use saline nasal sprays to help moisten and clean your sinuses. Take medicines only as directed by your health care provider. If you were prescribed either an antibiotic or antifungal medicine, finish it all even if you start to feel better. SEEK IMMEDIATE MEDICAL CARE IF: You have increasing pain or severe headaches. You have nausea, vomiting, or drowsiness. You have swelling around your face. You have vision problems. You have a stiff neck. You have difficulty breathing. MAKE SURE YOU:  Understand these instructions. Will watch your condition. Will get help right away if you are not doing well or get worse. Document Released: 01/22/2005 Document Revised: 06/08/2013 Document Reviewed: 02/06/2011 [facility] Patient Information 2015 [place], [place]. This information is not intended to replace advice given to you by your health care provider. Make sure you discuss any questions you have with your health care provider.     Bad carbs also include fruit juice, alcohol, and sweet tea. These are empty calories that do not signal to your brain that you are full.   Please remember the good carbs are still carbs which convert into sugar. So please  measure them out no more than 1/2-1 cup of rice, oatmeal, pasta, and beans  Veggies are however free foods! Pile them on.   Not all fruit is created equal. Please see the list below, the fruit at the bottom is higher in sugars than the fruit at the top. Please avoid all dried fruits.

## 2014-12-24 LAB — BASIC METABOLIC PANEL WITH GFR
BUN: 18 mg/dL (ref 7–25)
CALCIUM: 9.6 mg/dL (ref 8.6–10.4)
CO2: 25 mmol/L (ref 20–31)
Chloride: 108 mmol/L (ref 98–110)
Creat: 0.79 mg/dL (ref 0.50–0.99)
GFR, EST NON AFRICAN AMERICAN: 82 mL/min (ref >=60)
GFR, Est African American: >89 mL/min (ref >=60)
GLUCOSE: 82 mg/dL (ref 65–99)
POTASSIUM: 4.2 mmol/L (ref 3.5–5.3)
Sodium: 141 mmol/L (ref 135–146)

## 2014-12-24 LAB — HEMOGLOBIN A1C
Hgb A1c MFr Bld: 6.5 % — ABNORMAL HIGH (ref <5.7)
Mean Plasma Glucose: 140 mg/dL — ABNORMAL HIGH (ref <117)

## 2014-12-24 LAB — LIPID PANEL
CHOL/HDL RATIO: 5.9 ratio — AB (ref <=5.0)
Cholesterol: 200 mg/dL (ref 125–200)
HDL: 34 mg/dL — AB (ref >=46)
LDL Cholesterol: 124 mg/dL (ref <130)
TRIGLYCERIDES: 211 mg/dL — AB (ref <150)
VLDL: 42 mg/dL — ABNORMAL HIGH (ref <30)

## 2014-12-24 LAB — HEPATIC FUNCTION PANEL
ALBUMIN: 4.2 g/dL (ref 3.6–5.1)
ALK PHOS: 63 U/L (ref 33–130)
ALT: 9 U/L (ref 6–29)
AST: 11 U/L (ref 10–35)
BILIRUBIN INDIRECT: 0.4 mg/dL (ref 0.2–1.2)
Bilirubin, Direct: 0.1 mg/dL (ref <=0.2)
TOTAL PROTEIN: 7.2 g/dL (ref 6.1–8.1)
Total Bilirubin: 0.5 mg/dL (ref 0.2–1.2)

## 2014-12-24 LAB — VITAMIN D 25 HYDROXY (VIT D DEFICIENCY, FRACTURES): VIT D 25 HYDROXY: 20 ng/mL — AB (ref 30–100)

## 2014-12-24 LAB — TSH: TSH: 3.058 u[IU]/mL (ref 0.350–4.500)

## 2014-12-24 LAB — MAGNESIUM: Magnesium: 2.1 mg/dL (ref 1.5–2.5)

## 2015-01-20 ENCOUNTER — Other Ambulatory Visit: Payer: Self-pay | Admitting: Internal Medicine

## 2015-01-20 DIAGNOSIS — E119 Type 2 diabetes mellitus without complications: Secondary | ICD-10-CM

## 2015-03-05 ENCOUNTER — Other Ambulatory Visit: Payer: Self-pay | Admitting: Physician Assistant

## 2015-03-30 ENCOUNTER — Ambulatory Visit (INDEPENDENT_AMBULATORY_CARE_PROVIDER_SITE_OTHER): Payer: 59 | Admitting: Physician Assistant

## 2015-03-30 ENCOUNTER — Encounter: Payer: Self-pay | Admitting: Physician Assistant

## 2015-03-30 VITALS — BP 132/74 | HR 76 | Temp 97.7°F | Resp 16 | Ht 67.0 in | Wt 194.0 lb

## 2015-03-30 DIAGNOSIS — E559 Vitamin D deficiency, unspecified: Secondary | ICD-10-CM | POA: Diagnosis not present

## 2015-03-30 DIAGNOSIS — Z79899 Other long term (current) drug therapy: Secondary | ICD-10-CM

## 2015-03-30 DIAGNOSIS — N181 Chronic kidney disease, stage 1: Secondary | ICD-10-CM | POA: Diagnosis not present

## 2015-03-30 DIAGNOSIS — E1122 Type 2 diabetes mellitus with diabetic chronic kidney disease: Secondary | ICD-10-CM | POA: Diagnosis not present

## 2015-03-30 DIAGNOSIS — E785 Hyperlipidemia, unspecified: Secondary | ICD-10-CM

## 2015-03-30 DIAGNOSIS — Z720 Tobacco use: Secondary | ICD-10-CM

## 2015-03-30 DIAGNOSIS — E669 Obesity, unspecified: Secondary | ICD-10-CM | POA: Diagnosis not present

## 2015-03-30 DIAGNOSIS — I10 Essential (primary) hypertension: Secondary | ICD-10-CM

## 2015-03-30 NOTE — Progress Notes (Signed)
Assessment and Plan:  1. Hypertension -Continue medication, monitor blood pressure at home. Continue DASH diet.  Reminder to go to the ER if any CP, SOB, nausea, dizziness, severe HA, changes vision/speech, left arm numbness and tingling and jaw pain.  2. Cholesterol -Continue diet and exercise. Check cholesterol.   3. Diabetes without complications -Continue diet and exercise. Check A1C Continue invokana and metformin  4. Vitamin D Def - check level and continue medications.   5. Obesity with co morbidities - long discussion about weight loss, diet, and exercise   Continue diet and meds as discussed. Further disposition pending results of labs. Discussed med's effects and SE's.   Over 30 minutes of exam, counseling, chart review, and critical decision making was performed   HPI 60 y.o. smoking W female  presents for 3 month follow up on hypertension, cholesterol, diabetes and vitamin D deficiency.   Her blood pressure has been controlled at home, today her BP is BP: 132/74 mmHg.  She does workout, active at home and walks at work. She denies chest pain, shortness of breath, dizziness. She is primary care given for her husband, who is unable to walk anymore due to spinal stenosis, has had complications with sepsis, she has a lady coming in to help, has seen improvement in past 2 weeks. Will take xanax occ for panic attacks.Marland Kitchen Has been getting headaches last 3 weeks.   She is not on cholesterol medication and denies myalgias. Her cholesterol is not at goal. The cholesterol was:   Lab Results  Component Value Date   CHOL 200 12/23/2014   HDL 34* 12/23/2014   LDLCALC 124 12/23/2014   TRIG 211* 12/23/2014   CHOLHDL 5.9* 12/23/2014    She has been working on diet and exercise for diabetes with diabetic chronic kidney disease,  Lab Results  Component Value Date   GFRNONAA 82 12/23/2014   , she is on bASA, she is on ACE/ARB,she is invokana and metformin, and denies  paresthesia of the  feet, polydipsia, polyuria and visual disturbances. Last A1C was:  Lab Results  Component Value Date   HGBA1C 6.5* 12/23/2014    Patient is not on Vitamin D supplement. Lab Results  Component Value Date   VD25OH 20* 12/23/2014   BMI is Body mass index is 30.38 kg/(m^2)., she is working on diet and exercise. Wt Readings from Last 3 Encounters:  03/30/15 194 lb (87.998 kg)  12/23/14 192 lb (87.091 kg)  09/22/14 196 lb 9.6 oz (89.177 kg)    Current Medications:  Current Outpatient Prescriptions on File Prior to Visit  Medication Sig Dispense Refill  . ALPRAZolam (XANAX) 0.25 MG tablet Take 1 tablet (0.25 mg total) by mouth 2 (two) times daily. 60 tablet 0  . amLODipine-benazepril (LOTREL) 10-40 MG capsule TAKE 1 CAPSULE EVERY DAY 90 capsule 1  . canagliflozin (INVOKANA) 300 MG TABS tablet Take 300 mg by mouth daily before breakfast. 30 tablet PRN  . metFORMIN (GLUCOPHAGE-XR) 500 MG 24 hr tablet Take 3 tablets daily for Diabetes (maximum dose is 4 tablets/day) 270 tablet 1  . hydrochlorothiazide (MICROZIDE) 12.5 MG capsule Take 1 capsule (12.5 mg total) by mouth daily. 30 capsule 3   No current facility-administered medications on file prior to visit.   Medical History:  Past Medical History  Diagnosis Date  . Hypertension   . Hyperlipidemia   . Vitamin D deficiency   . Right bundle branch block 05/2005  . Asthma   . Anemia   . Depression   .  Diabetes mellitus without complication ([place])    Allergies:  Allergies  Allergen Reactions  . Tylox [Oxycodone-Acetaminophen]      Review of Systems:  Review of Systems  Constitutional: Negative.   HENT: Positive for congestion. Negative for ear discharge, ear pain, hearing loss, nosebleeds, sore throat and tinnitus.   Eyes: Negative.   Respiratory: Negative.  Negative for stridor.   Cardiovascular: Negative.   Gastrointestinal: Positive for constipation. Negative for heartburn, nausea, vomiting, abdominal pain, diarrhea, blood in  stool and melena.  Genitourinary: Negative.   Musculoskeletal: Negative.   Skin: Negative.   Neurological: Positive for headaches. Negative for dizziness, tingling, tremors, sensory change, speech change, focal weakness, seizures and loss of consciousness.  Psychiatric/Behavioral: Negative.     Family history- Review and unchanged Social history- Review and unchanged Physical Exam: BP 132/74 mmHg  Pulse 76  Temp(Src) 97.7 F (36.5 C) (Temporal)  Resp 16  Ht 5\' 7"  (1.702 m)  Wt 194 lb (87.998 kg)  BMI 30.38 kg/m2  SpO2 95% Wt Readings from Last 3 Encounters:  03/30/15 194 lb (87.998 kg)  12/23/14 192 lb (87.091 kg)  09/22/14 196 lb 9.6 oz (89.177 kg)   General Appearance: Well nourished, in no apparent distress. Eyes: PERRLA, EOMs, conjunctiva no swelling or erythema Sinuses: + Frontal/maxillary tenderness ENT/Mouth: Ext aud canals clear, TMs without erythema, bulging. No erythema, swelling, or exudate on post pharynx.  Tonsils not swollen or erythematous. Hearing normal.  Neck: Supple, thyroid normal.  Respiratory: Respiratory effort normal, BS equal bilaterally without rales, rhonchi, wheezing or stridor.  Cardio: RRR with no MRGs. Brisk peripheral pulses without edema.  Abdomen: Soft, + BS.  Non tender, no guarding, rebound, hernias, masses. Lymphatics: Non tender without lymphadenopathy.  Musculoskeletal: Full ROM, 5/5 strength, Normal gait Skin: Warm, dry without rashes, lesions, ecchymosis.  Neuro: Cranial nerves intact. No cerebellar symptoms.  Psych: Awake and oriented X 3, normal affect, Insight and Judgment appropriate.    Vicie Mutters, PA-C 3:44 PM [facility]

## 2015-03-30 NOTE — Patient Instructions (Signed)
We are starting you on Metformin to prevent or treat diabetes. Metformin does not cause low blood sugars. In order to create energy your cells need insulin and sugar but sometime your cells do not accept the insulin and this can cause increased sugars and decreased energy. The Metformin helps your cells accept insulin and the sugar to give you more energy.   The two most common side effects are nausea and diarrhea, follow these rules to avoid it! You can take imodium per box instructions when starting metformin if needed.   Rules of metformin: 1) start out slow with only one pill daily. Our goal for you is 4 pills a day or 2000mg  total.  2) take with your largest meal. 3) Take with least amount of carbs.   Call if you have any problems.      Bad carbs also include fruit juice, alcohol, and sweet tea. These are empty calories that do not signal to your brain that you are full.   Please remember the good carbs are still carbs which convert into sugar. So please measure them out no more than 1/2-1 cup of rice, oatmeal, pasta, and beans  Veggies are however free foods! Pile them on.   Not all fruit is created equal. Please see the list below, the fruit at the bottom is higher in sugars than the fruit at the top. Please avoid all dried fruits.     Recommendations For Diabetic/Prediabetic Patients:   -  Take medications as prescribed  -  Recommend Dr Fara Olden Fuhrman's book "The End of Diabetes "  And "The End of Dieting"- Can get at  www.[email or web address] and encourage also get the Audio CD book  - AVOID Animal products, ie. Meat - red/white, Poultry and Dairy/especially cheese - Exercise at least 5 times a week for 30 minutes or preferably daily.  - No Smoking - Drink less than 2 drinks a day.  - Monitor your feet for sores - Have yearly Eye Exams - Recommend annual Flu vaccine  - Recommend Pneumovax and Prevnar vaccines - Shingles Vaccine (Zostavax) if over 17 y.o.  Goals:   - BMI less than  24 - Fasting sugar less than 130 or less than 150 if tapering medicines to lose weight  - Systolic BP less than [id number]  - Diastolic BP less than 80 - Bad LDL Cholesterol less than 70 - Triglycerides less than 150

## 2015-03-31 LAB — MAGNESIUM: MAGNESIUM: 1.9 mg/dL (ref 1.5–2.5)

## 2015-03-31 LAB — BASIC METABOLIC PANEL WITH GFR
BUN: 13 mg/dL (ref 7–25)
CALCIUM: 9.7 mg/dL (ref 8.6–10.4)
CO2: 28 mmol/L (ref 20–31)
CREATININE: 0.84 mg/dL (ref 0.50–0.99)
Chloride: 106 mmol/L (ref 98–110)
GFR, Est African American: 87 mL/min (ref >=60)
GFR, Est Non African American: 75 mL/min (ref >=60)
Glucose, Bld: 84 mg/dL (ref 65–99)
Potassium: 4.1 mmol/L (ref 3.5–5.3)
SODIUM: 140 mmol/L (ref 135–146)

## 2015-03-31 LAB — CBC WITH DIFFERENTIAL/PLATELET
BASOS ABS: 0 10*3/uL (ref 0.0–0.1)
Basophils Relative: 0 % (ref 0–1)
EOS ABS: 0.2 10*3/uL (ref 0.0–0.7)
Eosinophils Relative: 2 % (ref 0–5)
HCT: 40.6 % (ref 36.0–46.0)
HEMOGLOBIN: 13.5 g/dL (ref 12.0–15.0)
LYMPHS ABS: 3.5 10*3/uL (ref 0.7–4.0)
LYMPHS PCT: 37 % (ref 12–46)
MCH: 30.1 pg (ref 26.0–34.0)
MCHC: 33.3 g/dL (ref 30.0–36.0)
MCV: 90.6 fL (ref 78.0–100.0)
MONOS PCT: 8 % (ref 3–12)
MPV: 9.1 fL (ref 8.6–12.4)
Monocytes Absolute: 0.8 10*3/uL (ref 0.1–1.0)
NEUTROS ABS: 5 10*3/uL (ref 1.7–7.7)
NEUTROS PCT: 53 % (ref 43–77)
Platelets: 242 10*3/uL (ref 150–400)
RBC: 4.48 MIL/uL (ref 3.87–5.11)
RDW: 13.7 % (ref 11.5–15.5)
WBC: 9.5 10*3/uL (ref 4.0–10.5)

## 2015-03-31 LAB — HEPATIC FUNCTION PANEL
ALBUMIN: 4.1 g/dL (ref 3.6–5.1)
ALT: 10 U/L (ref 6–29)
AST: 11 U/L (ref 10–35)
Alkaline Phosphatase: 58 U/L (ref 33–130)
Bilirubin, Direct: 0.1 mg/dL (ref <=0.2)
Indirect Bilirubin: 0.2 mg/dL (ref 0.2–1.2)
TOTAL PROTEIN: 6.9 g/dL (ref 6.1–8.1)
Total Bilirubin: 0.3 mg/dL (ref 0.2–1.2)

## 2015-03-31 LAB — LIPID PANEL
CHOL/HDL RATIO: 5.2 ratio — AB (ref <=5.0)
CHOLESTEROL: 183 mg/dL (ref 125–200)
HDL: 35 mg/dL — AB (ref >=46)
LDL Cholesterol: 99 mg/dL (ref <130)
TRIGLYCERIDES: 244 mg/dL — AB (ref <150)
VLDL: 49 mg/dL — AB (ref <30)

## 2015-03-31 LAB — HEMOGLOBIN A1C
Hgb A1c MFr Bld: 6.6 % — ABNORMAL HIGH (ref <5.7)
Mean Plasma Glucose: 143 mg/dL — ABNORMAL HIGH (ref <117)

## 2015-03-31 LAB — VITAMIN D 25 HYDROXY (VIT D DEFICIENCY, FRACTURES): VIT D 25 HYDROXY: 15 ng/mL — AB (ref 30–100)

## 2015-03-31 LAB — TSH: TSH: 3.28 m[IU]/L

## 2015-06-28 ENCOUNTER — Other Ambulatory Visit: Payer: Self-pay

## 2015-06-28 DIAGNOSIS — Z1231 Encounter for screening mammogram for malignant neoplasm of breast: Secondary | ICD-10-CM

## 2015-06-29 ENCOUNTER — Ambulatory Visit (INDEPENDENT_AMBULATORY_CARE_PROVIDER_SITE_OTHER): Payer: 59 | Admitting: Physician Assistant

## 2015-06-29 ENCOUNTER — Encounter: Payer: Self-pay | Admitting: Physician Assistant

## 2015-06-29 VITALS — BP 126/76 | HR 68 | Temp 97.4°F | Resp 16 | Ht 67.0 in | Wt 187.6 lb

## 2015-06-29 DIAGNOSIS — I10 Essential (primary) hypertension: Secondary | ICD-10-CM

## 2015-06-29 DIAGNOSIS — Z79899 Other long term (current) drug therapy: Secondary | ICD-10-CM

## 2015-06-29 DIAGNOSIS — N182 Chronic kidney disease, stage 2 (mild): Secondary | ICD-10-CM

## 2015-06-29 DIAGNOSIS — E559 Vitamin D deficiency, unspecified: Secondary | ICD-10-CM

## 2015-06-29 DIAGNOSIS — E669 Obesity, unspecified: Secondary | ICD-10-CM | POA: Diagnosis not present

## 2015-06-29 DIAGNOSIS — E1122 Type 2 diabetes mellitus with diabetic chronic kidney disease: Secondary | ICD-10-CM

## 2015-06-29 DIAGNOSIS — E1165 Type 2 diabetes mellitus with hyperglycemia: Secondary | ICD-10-CM

## 2015-06-29 DIAGNOSIS — Z720 Tobacco use: Secondary | ICD-10-CM

## 2015-06-29 DIAGNOSIS — E785 Hyperlipidemia, unspecified: Secondary | ICD-10-CM

## 2015-06-29 DIAGNOSIS — N181 Chronic kidney disease, stage 1: Secondary | ICD-10-CM | POA: Diagnosis not present

## 2015-06-29 DIAGNOSIS — IMO0002 Reserved for concepts with insufficient information to code with codable children: Secondary | ICD-10-CM

## 2015-06-29 LAB — CBC WITH DIFFERENTIAL/PLATELET
BASOS PCT: 1 %
Basophils Absolute: 96 cells/uL (ref 0–200)
EOS ABS: 288 {cells}/uL (ref 15–500)
Eosinophils Relative: 3 %
HCT: 42.8 % (ref 35.0–45.0)
Hemoglobin: 14.3 g/dL (ref 11.7–15.5)
LYMPHS PCT: 40 %
Lymphs Abs: 3840 cells/uL (ref 850–3900)
MCH: 30.3 pg (ref 27.0–33.0)
MCHC: 33.4 g/dL (ref 32.0–36.0)
MCV: 90.7 fL (ref 80.0–100.0)
MONOS PCT: 8 %
MPV: 9.4 fL (ref 7.5–12.5)
Monocytes Absolute: 768 cells/uL (ref 200–950)
NEUTROS ABS: 4608 {cells}/uL (ref 1500–7800)
Neutrophils Relative %: 48 %
PLATELETS: 272 10*3/uL (ref 140–400)
RBC: 4.72 MIL/uL (ref 3.80–5.10)
RDW: 13.4 % (ref 11.0–15.0)
WBC: 9.6 10*3/uL (ref 3.8–10.8)

## 2015-06-29 LAB — TSH: TSH: 2.68 mIU/L

## 2015-06-29 MED ORDER — AMLODIPINE BESY-BENAZEPRIL HCL 10-40 MG PO CAPS: 1.0000 | ORAL_CAPSULE | Freq: Every day | ORAL | Status: DC

## 2015-06-29 NOTE — Patient Instructions (Signed)

## 2015-06-29 NOTE — Progress Notes (Signed)
Assessment and Plan:  1. Hypertension -Continue medication, monitor blood pressure at home. Continue DASH diet.  Reminder to go to the ER if any CP, SOB, nausea, dizziness, severe HA, changes vision/speech, left arm numbness and tingling and jaw pain.  2. Cholesterol -Continue diet and exercise. Check cholesterol.   3. Diabetes without complications -Continue diet and exercise. Check A1C Continue invokana and metformin  4. Vitamin D Def - check level and continue medications.   5. Obesity with co morbidities - long discussion about weight loss, diet, and exercise   Continue diet and meds as discussed. Further disposition pending results of labs. Discussed med's effects and SE's.   Over 30 minutes of exam, counseling, chart review, and critical decision making was performed   HPI 60 y.o. smoking W female  presents for 3 month follow up on hypertension, cholesterol, diabetes and vitamin D deficiency.   Her blood pressure has been controlled at home, today her BP is BP: 126/76 mmHg.  She does workout, active at home and walks at work. She denies chest pain, shortness of breath, dizziness. She is primary care given for her husband, who is has spinal stenosis and has transitioned to a cane and doing better. Will take xanax occ for panic attacks.Marland Kitchen Has been getting headaches last 3 weeks.   She is not on cholesterol medication and denies myalgias. Her cholesterol is not at goal. The cholesterol was:   Lab Results  Component Value Date   CHOL 183 03/30/2015   HDL 35* 03/30/2015   LDLCALC 99 03/30/2015   TRIG 244* 03/30/2015   CHOLHDL 5.2* 03/30/2015    She has been working on diet and exercise for diabetes with diabetic chronic kidney disease,  Lab Results  Component Value Date   GFRNONAA 48 03/30/2015   , she is on bASA, she is on ACE/ARB,she is invokana and metformin, and denies  paresthesia of the feet, polydipsia, polyuria and visual disturbances. Last A1C was:  Lab Results   Component Value Date   HGBA1C 6.6* 03/30/2015    Patient is not on Vitamin D supplement. Lab Results  Component Value Date   VD25OH 15* 03/30/2015   BMI is Body mass index is 29.38 kg/(m^2)., she is working on diet and exercise. Wt Readings from Last 3 Encounters:  06/29/15 187 lb 9.6 oz (85.095 kg)  03/30/15 194 lb (87.998 kg)  12/23/14 192 lb (87.091 kg)    Current Medications:  Current Outpatient Prescriptions on File Prior to Visit  Medication Sig Dispense Refill  . ALPRAZolam (XANAX) 0.25 MG tablet Take 1 tablet (0.25 mg total) by mouth 2 (two) times daily. 60 tablet 0  . amLODipine-benazepril (LOTREL) 10-40 MG capsule TAKE 1 CAPSULE EVERY DAY 90 capsule 1  . canagliflozin (INVOKANA) 300 MG TABS tablet Take 300 mg by mouth daily before breakfast. 30 tablet PRN   No current facility-administered medications on file prior to visit.   Medical History:  Past Medical History  Diagnosis Date  . Hypertension   . Hyperlipidemia   . Vitamin D deficiency   . Right bundle branch block 05/2005  . Asthma   . Anemia   . Depression   . Diabetes mellitus without complication ([place])    Allergies:  Allergies  Allergen Reactions  . Tylox [Oxycodone-Acetaminophen]      Review of Systems:  Review of Systems  Constitutional: Negative.   HENT: Positive for congestion. Negative for ear discharge, ear pain, hearing loss, nosebleeds, sore throat and tinnitus.   Eyes:  Negative.   Respiratory: Negative.  Negative for stridor.   Cardiovascular: Negative.   Gastrointestinal: Positive for constipation. Negative for heartburn, nausea, vomiting, abdominal pain, diarrhea, blood in stool and melena.  Genitourinary: Negative.   Musculoskeletal: Negative.   Skin: Negative.   Neurological: Negative for dizziness, tingling, tremors, sensory change, speech change, focal weakness, seizures, loss of consciousness and headaches.  Psychiatric/Behavioral: Negative.     Family history- Review and  unchanged Social history- Review and unchanged Physical Exam: BP 126/76 mmHg  Pulse 68  Temp(Src) 97.4 F (36.3 C) (Temporal)  Resp 16  Ht 5\' 7"  (1.702 m)  Wt 187 lb 9.6 oz (85.095 kg)  BMI 29.38 kg/m2  SpO2 97% Wt Readings from Last 3 Encounters:  06/29/15 187 lb 9.6 oz (85.095 kg)  03/30/15 194 lb (87.998 kg)  12/23/14 192 lb (87.091 kg)   General Appearance: Well nourished, in no apparent distress. Eyes: PERRLA, EOMs, conjunctiva no swelling or erythema Sinuses: + Frontal/maxillary tenderness ENT/Mouth: Ext aud canals clear, TMs without erythema, bulging. No erythema, swelling, or exudate on post pharynx.  Tonsils not swollen or erythematous. Hearing normal.  Neck: Supple, thyroid normal.  Respiratory: Respiratory effort normal, BS equal bilaterally without rales, rhonchi, wheezing or stridor.  Cardio: RRR with no MRGs. Brisk peripheral pulses without edema.  Abdomen: Soft, + BS.  Non tender, no guarding, rebound, hernias, masses. Lymphatics: Non tender without lymphadenopathy.  Musculoskeletal: Full ROM, 5/5 strength, Normal gait Skin: Warm, dry without rashes, lesions, ecchymosis.  Neuro: Cranial nerves intact. No cerebellar symptoms.  Psych: Awake and oriented X 3, normal affect, Insight and Judgment appropriate.    Vicie Mutters, PA-C 4:21 PM [facility]

## 2015-06-30 LAB — BASIC METABOLIC PANEL WITH GFR
BUN: 10 mg/dL (ref 7–25)
CHLORIDE: 106 mmol/L (ref 98–110)
CO2: 25 mmol/L (ref 20–31)
Calcium: 9.7 mg/dL (ref 8.6–10.4)
Creat: 0.76 mg/dL (ref 0.50–0.99)
GFR, EST NON AFRICAN AMERICAN: 85 mL/min (ref >=60)
GFR, Est African American: >89 mL/min (ref >=60)
Glucose, Bld: 80 mg/dL (ref 65–99)
POTASSIUM: 4 mmol/L (ref 3.5–5.3)
SODIUM: 141 mmol/L (ref 135–146)

## 2015-06-30 LAB — LIPID PANEL
Cholesterol: 185 mg/dL (ref 125–200)
HDL: 36 mg/dL — AB (ref >=46)
LDL CALC: 95 mg/dL (ref <130)
Total CHOL/HDL Ratio: 5.1 Ratio — ABNORMAL HIGH (ref <=5.0)
Triglycerides: 268 mg/dL — ABNORMAL HIGH (ref <150)
VLDL: 54 mg/dL — AB (ref <30)

## 2015-06-30 LAB — HEPATIC FUNCTION PANEL
ALBUMIN: 3.9 g/dL (ref 3.6–5.1)
ALK PHOS: 72 U/L (ref 33–130)
ALT: 8 U/L (ref 6–29)
AST: 9 U/L — AB (ref 10–35)
BILIRUBIN TOTAL: 0.3 mg/dL (ref 0.2–1.2)
Bilirubin, Direct: <0.1 mg/dL (ref <=0.2)
TOTAL PROTEIN: 6.8 g/dL (ref 6.1–8.1)

## 2015-06-30 LAB — MAGNESIUM: Magnesium: 2.2 mg/dL (ref 1.5–2.5)

## 2015-06-30 LAB — HEMOGLOBIN A1C
HEMOGLOBIN A1C: 6.8 % — AB (ref <5.7)
Mean Plasma Glucose: 148 mg/dL

## 2015-06-30 LAB — VITAMIN D 25 HYDROXY (VIT D DEFICIENCY, FRACTURES): Vit D, 25-Hydroxy: 19 ng/mL — ABNORMAL LOW (ref 30–100)

## 2015-07-18 ENCOUNTER — Ambulatory Visit
Admission: RE | Admit: 2015-07-18 | Discharge: 2015-07-18 | Disposition: A | Discharge status: Home / Self Care | Payer: 59 | Source: Ambulatory Visit

## 2015-07-18 DIAGNOSIS — Z1231 Encounter for screening mammogram for malignant neoplasm of breast: Secondary | ICD-10-CM

## 2015-08-28 ENCOUNTER — Other Ambulatory Visit: Payer: Self-pay | Admitting: Physician Assistant

## 2015-09-26 ENCOUNTER — Other Ambulatory Visit: Payer: Self-pay | Admitting: Internal Medicine

## 2015-09-26 DIAGNOSIS — E119 Type 2 diabetes mellitus without complications: Secondary | ICD-10-CM

## 2015-11-01 ENCOUNTER — Ambulatory Visit (INDEPENDENT_AMBULATORY_CARE_PROVIDER_SITE_OTHER): Payer: 59 | Admitting: Physician Assistant

## 2015-11-01 ENCOUNTER — Encounter: Payer: Self-pay | Admitting: Physician Assistant

## 2015-11-01 VITALS — BP 122/68 | HR 88 | Temp 97.3°F | Resp 16 | Ht 67.0 in | Wt 184.2 lb

## 2015-11-01 DIAGNOSIS — Z720 Tobacco use: Secondary | ICD-10-CM | POA: Diagnosis not present

## 2015-11-01 DIAGNOSIS — E785 Hyperlipidemia, unspecified: Secondary | ICD-10-CM

## 2015-11-01 DIAGNOSIS — E669 Obesity, unspecified: Secondary | ICD-10-CM

## 2015-11-01 DIAGNOSIS — I10 Essential (primary) hypertension: Secondary | ICD-10-CM

## 2015-11-01 DIAGNOSIS — E559 Vitamin D deficiency, unspecified: Secondary | ICD-10-CM | POA: Diagnosis not present

## 2015-11-01 DIAGNOSIS — E1122 Type 2 diabetes mellitus with diabetic chronic kidney disease: Secondary | ICD-10-CM | POA: Diagnosis not present

## 2015-11-01 DIAGNOSIS — Z1211 Encounter for screening for malignant neoplasm of colon: Secondary | ICD-10-CM | POA: Diagnosis not present

## 2015-11-01 DIAGNOSIS — N181 Chronic kidney disease, stage 1: Secondary | ICD-10-CM

## 2015-11-01 LAB — HEPATIC FUNCTION PANEL
ALT: 12 U/L (ref 6–29)
AST: 15 U/L (ref 10–35)
Albumin: 4.4 g/dL (ref 3.6–5.1)
Alkaline Phosphatase: 55 U/L (ref 33–130)
BILIRUBIN DIRECT: 0.1 mg/dL (ref <=0.2)
Indirect Bilirubin: 0.4 mg/dL (ref 0.2–1.2)
Total Bilirubin: 0.5 mg/dL (ref 0.2–1.2)
Total Protein: 7 g/dL (ref 6.1–8.1)

## 2015-11-01 LAB — CBC WITH DIFFERENTIAL/PLATELET
BASOS ABS: 0 {cells}/uL (ref 0–200)
Basophils Relative: 0 %
Eosinophils Absolute: 208 cells/uL (ref 15–500)
Eosinophils Relative: 2 %
HEMATOCRIT: 41.8 % (ref 35.0–45.0)
HEMOGLOBIN: 14.3 g/dL (ref 11.7–15.5)
LYMPHS ABS: 3952 {cells}/uL — AB (ref 850–3900)
LYMPHS PCT: 38 %
MCH: 30.8 pg (ref 27.0–33.0)
MCHC: 34.2 g/dL (ref 32.0–36.0)
MCV: 90.1 fL (ref 80.0–100.0)
MONO ABS: 728 {cells}/uL (ref 200–950)
MPV: 9.1 fL (ref 7.5–12.5)
Monocytes Relative: 7 %
NEUTROS PCT: 53 %
Neutro Abs: 5512 cells/uL (ref 1500–7800)
Platelets: 231 10*3/uL (ref 140–400)
RBC: 4.64 MIL/uL (ref 3.80–5.10)
RDW: 13.1 % (ref 11.0–15.0)
WBC: 10.4 10*3/uL (ref 3.8–10.8)

## 2015-11-01 LAB — BASIC METABOLIC PANEL WITH GFR
BUN: 21 mg/dL (ref 7–25)
CALCIUM: 9.7 mg/dL (ref 8.6–10.4)
CO2: 22 mmol/L (ref 20–31)
CREATININE: 0.96 mg/dL (ref 0.50–0.99)
Chloride: 107 mmol/L (ref 98–110)
GFR, Est African American: 74 mL/min (ref >=60)
GFR, Est Non African American: 64 mL/min (ref >=60)
Glucose, Bld: 95 mg/dL (ref 65–99)
Potassium: 4.1 mmol/L (ref 3.5–5.3)
Sodium: 140 mmol/L (ref 135–146)

## 2015-11-01 LAB — LIPID PANEL
CHOL/HDL RATIO: 4.5 ratio (ref <=5.0)
Cholesterol: 184 mg/dL (ref 125–200)
HDL: 41 mg/dL — AB (ref >=46)
LDL CALC: 116 mg/dL (ref <130)
Triglycerides: 135 mg/dL (ref <150)
VLDL: 27 mg/dL (ref <30)

## 2015-11-01 LAB — TSH: TSH: 2.72 mIU/L

## 2015-11-01 NOTE — Progress Notes (Signed)
Assessment and Plan:   Hypertension -Continue medication, monitor blood pressure at home. Continue DASH diet.  Reminder to go to the ER if any CP, SOB, nausea, dizziness, severe HA, changes vision/speech, left arm numbness and tingling and jaw pain.  Cholesterol -Continue diet and exercise. Check cholesterol.    Diabetes with diabetic chronic kidney disease -Continue diet and exercise. Check A1C Continue invokana and metformin   Vitamin D Def - check level and continue medications.    Obesity with co morbidities - long discussion about weight loss, diet, and exercise  Tobacco use Smoking cessation-  instruction/counseling given, counseled patient on the dangers of tobacco use, advised patient to stop smoking, and reviewed strategies to maximize success, patient not ready to quit at this time.  Continue diet and meds as discussed.   Further disposition pending results of labs. Discussed med's effects and SE's.   Over 30 minutes of exam, counseling, chart review, and critical decision making was performed   HPI 60 y.o. smoking W female  presents for 3 month follow up on hypertension, cholesterol, diabetes and vitamin D deficiency.   Her blood pressure has been controlled at home, today her BP is BP: 122/68.  She does workout, active at home and walks at work, going to [place] [facility] [place] [facility], and doing a walk run class and going to do gobbler. She denies chest pain, shortness of breath, dizziness. She is primary care given for her husband, who is has spinal stenosis and has transitioned to a cane and doing better. Will take xanax occ for panic attacks.  She is not on cholesterol medication and denies myalgias. Her cholesterol is not at goal. The cholesterol was:   Lab Results  Component Value Date   CHOL 185 06/29/2015   HDL 36 (L) 06/29/2015   LDLCALC 95 06/29/2015   TRIG 268 (H) 06/29/2015   CHOLHDL 5.1 (H) 06/29/2015    She has been working on diet and exercise for diabetes  with diabetic chronic kidney disease,  Lab Results  Component Value Date   GFRNONAA 85 06/29/2015   , she is on bASA, she is on ACE/ARB,she is invokana and metformin, and denies  paresthesia of the feet, polydipsia, polyuria and visual disturbances. Last A1C was:  Lab Results  Component Value Date   HGBA1C 6.8 (H) 06/29/2015    Patient is not on Vitamin D supplement, has been on 5000 IU daily Lab Results  Component Value Date   VD25OH 19 (L) 06/29/2015   BMI is Body mass index is 28.85 kg/m., she is working on diet and exercise. Wt Readings from Last 3 Encounters:  11/01/15 184 lb 3.2 oz (83.6 kg)  06/29/15 187 lb 9.6 oz (85.1 kg)  03/30/15 194 lb (88 kg)    Current Medications:  Current Outpatient Prescriptions on File Prior to Visit  Medication Sig Dispense Refill  . ALPRAZolam (XANAX) 0.25 MG tablet Take 1 tablet (0.25 mg total) by mouth 2 (two) times daily. 60 tablet 0  . amLODipine-benazepril (LOTREL) 10-40 MG capsule Take 1 capsule by mouth daily. 90 capsule 1  . INVOKANA 300 MG TABS tablet TAKE 1 TABLET BY MOUTH EVERY MORNING BEFORE BREAKFAST 90 tablet 1  . metFORMIN (GLUCOPHAGE-XR) 500 MG 24 hr tablet TAKE 3 TABLETS DAILY FOR DIABETES (MAXIMUM DOSE IS 4 TABLETS/DAY) 270 tablet 1   No current facility-administered medications on file prior to visit.    Medical History:  Past Medical History:  Diagnosis Date  . Anemia   .  Asthma   . Depression   . Diabetes mellitus without complication ([place])   . Hyperlipidemia   . Hypertension   . Right bundle branch block 05/2005  . Vitamin D deficiency    Allergies:  Allergies  Allergen Reactions  . Tylox [Oxycodone-Acetaminophen]      Review of Systems:  Review of Systems  Constitutional: Negative.   HENT: Negative for congestion, ear discharge, ear pain, hearing loss, nosebleeds, sore throat and tinnitus.   Eyes: Negative.   Respiratory: Negative.  Negative for stridor.   Cardiovascular: Negative.   Gastrointestinal:  Positive for constipation. Negative for abdominal pain, blood in stool, diarrhea, heartburn, melena, nausea and vomiting.  Genitourinary: Negative.   Musculoskeletal: Negative.   Skin: Negative.   Neurological: Negative for dizziness, tingling, tremors, sensory change, speech change, focal weakness, seizures, loss of consciousness and headaches.  Psychiatric/Behavioral: Negative.     Family history- Review and unchanged Social history- Review and unchanged Physical Exam: BP 122/68   Pulse 88   Temp 97.3 F (36.3 C)   Resp 16   Ht 5\' 7"  (1.702 m)   Wt 184 lb 3.2 oz (83.6 kg)   SpO2 96%   BMI 28.85 kg/m  Wt Readings from Last 3 Encounters:  11/01/15 184 lb 3.2 oz (83.6 kg)  06/29/15 187 lb 9.6 oz (85.1 kg)  03/30/15 194 lb (88 kg)   General Appearance: Well nourished, in no apparent distress. Eyes: PERRLA, EOMs, conjunctiva no swelling or erythema Sinuses: + Frontal/maxillary tenderness ENT/Mouth: Ext aud canals clear, TMs without erythema, bulging. No erythema, swelling, or exudate on post pharynx.  Tonsils not swollen or erythematous. Hearing normal.  Neck: Supple, thyroid normal.  Respiratory: Respiratory effort normal, BS equal bilaterally without rales, rhonchi, wheezing or stridor.  Cardio: RRR with no MRGs. Brisk peripheral pulses without edema.  Abdomen: Soft, + BS.  Non tender, no guarding, rebound, hernias, masses. Lymphatics: Non tender without lymphadenopathy.  Musculoskeletal: Full ROM, 5/5 strength, Normal gait Skin: Warm, dry without rashes, lesions, ecchymosis.  Neuro: Cranial nerves intact. No cerebellar symptoms.  Psych: Awake and oriented X 3, normal affect, Insight and Judgment appropriate.    Vicie Mutters, PA-C 4:20 PM [facility]

## 2015-11-01 NOTE — Patient Instructions (Addendum)
   Colon cancer is 3rd most diagnosed cancer and 2nd leading cause of death in both men and women 62 years of age and older despite being one of the most preventable and treatable cancers if found early.  4 of out 5 people diagnosed with colon cancer have NO prior family history.  When caught EARLY 90% of colon cancer is curable.  We will set you up for a colonoscopy  Recommendations For Diabetic/Prediabetic Patients:   -  Take medications as prescribed  -  Recommend Dr Lear Ng book "The End of Diabetes "  And "The End of Dieting"- Can get at  www.[email or web address].com and encourage also get the Audio CD book  - AVOID Animal products, ie. Meat - red/white, Poultry and Dairy/especially cheese - Exercise at least 5 times a week for 30 minutes or preferably daily.  - No Smoking - Drink less than 2 drinks a day.  - Monitor your feet for sores - Have yearly Eye Exams - Recommend annual Flu vaccine  - Recommend Pneumovax and Prevnar vaccines - Shingles Vaccine (Zostavax) if over 57 y.o.  Goals:   - BMI less than 24 - Fasting sugar less than 130 or less than 150 if tapering medicines to lose weight  - Systolic BP less than [id number]  - Diastolic BP less than 80 - Bad LDL Cholesterol less than 70 - Triglycerides less than 150

## 2015-11-02 LAB — HEMOGLOBIN A1C
HEMOGLOBIN A1C: 6 % — AB (ref <5.7)
Mean Plasma Glucose: 126 mg/dL

## 2015-11-02 LAB — VITAMIN D 25 HYDROXY (VIT D DEFICIENCY, FRACTURES): Vit D, 25-Hydroxy: 64 ng/mL (ref 30–100)

## 2015-11-03 ENCOUNTER — Encounter: Payer: Self-pay | Admitting: Gastroenterology

## 2015-11-08 ENCOUNTER — Telehealth: Payer: Self-pay

## 2015-11-08 NOTE — Telephone Encounter (Signed)
PT AWARE OF LAB RESULTS & VOICED UNDERSTANDING.

## 2015-11-24 ENCOUNTER — Ambulatory Visit (AMBULATORY_SURGERY_CENTER): Payer: Self-pay

## 2015-11-24 VITALS — Ht 67.0 in | Wt 190.2 lb

## 2015-11-24 DIAGNOSIS — Z1211 Encounter for screening for malignant neoplasm of colon: Secondary | ICD-10-CM

## 2015-11-24 MED ORDER — SUPREP BOWEL PREP KIT 17.5-3.13-1.6 GM/177ML PO SOLN: 1.0000 | Freq: Once | ORAL | 0 refills | Status: AC

## 2015-11-28 ENCOUNTER — Encounter: Payer: Self-pay | Admitting: Gastroenterology

## 2015-12-07 ENCOUNTER — Ambulatory Visit (AMBULATORY_SURGERY_CENTER): Payer: 59 | Admitting: Gastroenterology

## 2015-12-07 ENCOUNTER — Encounter: Payer: Self-pay | Admitting: Gastroenterology

## 2015-12-07 VITALS — BP 100/65 | HR 57 | Temp 97.1°F | Resp 11 | Ht 67.0 in | Wt 190.0 lb

## 2015-12-07 DIAGNOSIS — Z1212 Encounter for screening for malignant neoplasm of rectum: Secondary | ICD-10-CM | POA: Diagnosis not present

## 2015-12-07 DIAGNOSIS — D126 Benign neoplasm of colon, unspecified: Secondary | ICD-10-CM

## 2015-12-07 DIAGNOSIS — D127 Benign neoplasm of rectosigmoid junction: Secondary | ICD-10-CM

## 2015-12-07 DIAGNOSIS — D122 Benign neoplasm of ascending colon: Secondary | ICD-10-CM

## 2015-12-07 DIAGNOSIS — K635 Polyp of colon: Secondary | ICD-10-CM | POA: Diagnosis not present

## 2015-12-07 DIAGNOSIS — Z1211 Encounter for screening for malignant neoplasm of colon: Secondary | ICD-10-CM | POA: Diagnosis not present

## 2015-12-07 DIAGNOSIS — D123 Benign neoplasm of transverse colon: Secondary | ICD-10-CM

## 2015-12-07 MED ORDER — SODIUM CHLORIDE 0.9 % IV SOLN: 500.0000 mL | INTRAVENOUS | Status: DC

## 2015-12-07 NOTE — Progress Notes (Signed)
Called to room to assist during endoscopic procedure.  Patient ID and intended procedure confirmed with present staff. Received instructions for my participation in the procedure from the performing physician.  

## 2015-12-07 NOTE — Patient Instructions (Signed)
YOU HAD AN ENDOSCOPIC PROCEDURE TODAY AT THE [facility]:   Refer to the procedure report that was given to you for any specific questions about what was found during the examination.  If the procedure report does not answer your questions, please call your gastroenterologist to clarify.  If you requested that your care partner not be given the details of your procedure findings, then the procedure report has been included in a sealed envelope for you to review at your convenience later.  YOU SHOULD EXPECT: Some feelings of bloating in the abdomen. Passage of more gas than usual.  Walking can help get rid of the air that was put into your GI tract during the procedure and reduce the bloating. If you had a lower endoscopy (such as a colonoscopy or flexible sigmoidoscopy) you may notice spotting of blood in your stool or on the toilet paper. If you underwent a bowel prep for your procedure, you may not have a normal bowel movement for a few days.  Please Note:  You might notice some irritation and congestion in your nose or some drainage.  This is from the oxygen used during your procedure.  There is no need for concern and it should clear up in a day or so.  SYMPTOMS TO REPORT IMMEDIATELY:   Following lower endoscopy (colonoscopy or flexible sigmoidoscopy):  Excessive amounts of blood in the stool  Significant tenderness or worsening of abdominal pains  Swelling of the abdomen that is new, acute  Fever of 100F or higher   Following upper endoscopy (EGD)  Vomiting of blood or coffee ground material  New chest pain or pain under the shoulder blades  Painful or persistently difficult swallowing  New shortness of breath  Fever of 100F or higher  Black, tarry-looking stools  For urgent or emergent issues, a gastroenterologist can be reached at any hour by calling [phone].   DIET:  We do recommend a small meal at first, but then you may proceed to your regular diet.  Drink  plenty of fluids but you should avoid alcoholic beverages for 24 hours.  ACTIVITY:  You should plan to take it easy for the rest of today and you should NOT DRIVE or use heavy machinery until tomorrow (because of the sedation medicines used during the test).    FOLLOW UP: Our staff will call the number listed on your records the next business day following your procedure to check on you and address any questions or concerns that you may have regarding the information given to you following your procedure. If we do not reach you, we will leave a message.  However, if you are feeling well and you are not experiencing any problems, there is no need to return our call.  We will assume that you have returned to your regular daily activities without incident.  If any biopsies were taken you will be contacted by phone or by letter within the next 1-3 weeks.  Please call us at [phone] if you have not heard about the biopsies in 3 weeks.    SIGNATURES/CONFIDENTIALITY: You and/or your care partner have signed paperwork which will be entered into your electronic medical record.  These signatures attest to the fact that that the information above on your After Visit Summary has been reviewed and is understood.  Full responsibility of the confidentiality of this discharge information lies with you and/or your care-partner.  Polyp, diverticulosis, and hemorrhoid information given. 

## 2015-12-07 NOTE — Progress Notes (Signed)
Spontaneous respirations throughout. VSS. Resting comfortably. To PACU on room air. Report to  Jane RN. 

## 2015-12-07 NOTE — Op Note (Signed)
[facility] Patient Name: Joyce Shannon Procedure Date: 12/07/2015 2:04 PM MRN: [id number] Endoscopist: Remo Lipps P. Breeona Waid MD, MD Age: 60 Referring MD:  Date of Birth: [birth year] Gender: Female Account #: [id number] Procedure:                Colonoscopy Indications:              Screening for malignant neoplasm in the colon, This                            is the patient's first colonoscopy Medicines:                Monitored Anesthesia Care Procedure:                Pre-Anesthesia Assessment:                           - Prior to the procedure, a History and Physical                            was performed, and patient medications and                            allergies were reviewed. The patient's tolerance of                            previous anesthesia was also reviewed. The risks                            and benefits of the procedure and the sedation                            options and risks were discussed with the patient.                            All questions were answered, and informed consent                            was obtained. Prior Anticoagulants: The patient has                            taken aspirin, last dose was 1 day prior to                            procedure. ASA Grade Assessment: III - A patient                            with severe systemic disease. After reviewing the                            risks and benefits, the patient was deemed in                            satisfactory condition to undergo the procedure.  After obtaining informed consent, the colonoscope                            was passed under direct vision. Throughout the                            procedure, the patient's blood pressure, pulse, and                            oxygen saturations were monitored continuously. The                            Model CF-HQ190L [id number]) scope was introduced                            through  the anus and advanced to the the cecum,                            identified by appendiceal orifice and ileocecal                            valve. The colonoscopy was performed without                            difficulty. The patient tolerated the procedure                            well. The quality of the bowel preparation was                            good. The ileocecal valve, appendiceal orifice, and                            rectum were photographed. Scope In: 2:14:38 PM Scope Out: 2:51:22 PM Scope Withdrawal Time: 0 hours 33 minutes 14 seconds  Total Procedure Duration: 0 hours 36 minutes 44 seconds  Findings:                 The perianal and digital rectal examinations were                            normal.                           A 8 mm polyp was found in the ascending colon. The                            polyp was sessile. The polyp was removed with a                            cold snare. Resection and retrieval were complete.                           A 7 mm polyp was found in the hepatic flexure. The  polyp was sessile. The polyp was removed with a                            cold snare. Resection and retrieval were complete.                           A 4 mm polyp was found in the transverse colon. The                            polyp was flat. The polyp was removed with a cold                            snare. Resection and retrieval were complete.                           Four sessile polyps were found in the recto-sigmoid                            colon. The polyps were 4 to 5 mm in size and likely                            hyperplastic. These polyps were removed with a cold                            snare. Resection and retrieval were complete.                           A 7 mm polyp was found in the recto-sigmoid colon.                            The polyp was pedunculated. The polyp was removed                            with a hot  snare. Resection and retrieval were                            complete.                           Non-bleeding internal hemorrhoids were found during                            retroflexion.                           A few small-mouthed diverticula were found in the                            left colon.                           The exam was otherwise without abnormality. The  colon was spastic and prolonged the procedure due                            to ongoing spasm. Complications:            No immediate complications. Estimated blood loss:                            Minimal. Estimated Blood Loss:     Estimated blood loss was minimal. Impression:               - One 8 mm polyp in the ascending colon, removed                            with a cold snare. Resected and retrieved.                           - One 7 mm polyp at the hepatic flexure, removed                            with a cold snare. Resected and retrieved.                           - One 4 mm polyp in the transverse colon, removed                            with a cold snare. Resected and retrieved.                           - Four 4 to 5 mm polyps at the recto-sigmoid colon,                            removed with a cold snare. Resected and retrieved.                           - One 7 mm polyp at the recto-sigmoid colon,                            removed with a hot snare. Resected and retrieved.                           - Non-bleeding internal hemorrhoids.                           - Diverticulosis in the left colon.                           - The examination was otherwise normal. Recommendation:           - Patient has a contact number available for                            emergencies. The signs and symptoms of potential  delayed complications were discussed with the                            patient. Return to normal activities tomorrow.                             Written discharge instructions were provided to the                            patient.                           - Resume previous diet.                           - Continue present medications.                           - No ibuprofen, naproxen, or other non-steroidal                            anti-inflammatory drugs for 2 weeks after polyp                            removal.                           - Await pathology results.                           - Repeat colonoscopy is recommended for                            surveillance. The colonoscopy date will be                            determined after pathology results from today's                            exam become available for review. Remo Lipps P. Taaliyah Delpriore MD, MD 12/07/2015 2:58:36 PM This report has been signed electronically.

## 2015-12-08 ENCOUNTER — Telehealth: Payer: Self-pay | Admitting: *Deleted

## 2015-12-08 NOTE — Telephone Encounter (Signed)
  Follow up Call-  Call back number 12/07/2015  Post procedure Call Back phone  # [phone]  Permission to leave phone message Yes  Some recent data might be hidden     Patient questions:  Do you have a fever, pain , or abdominal swelling? No. Pain Score  0 *  Have you tolerated food without any problems? Yes.    Have you been able to return to your normal activities? Yes.    Do you have any questions about your discharge instructions: Diet   No. Medications  No. Follow up visit  No.  Do you have questions or concerns about your Care? No.  Actions: * If pain score is 4 or above: No action needed, pain <4.

## 2015-12-12 ENCOUNTER — Encounter: Payer: Self-pay | Admitting: Gastroenterology

## 2015-12-22 ENCOUNTER — Encounter: Payer: Self-pay | Admitting: Internal Medicine

## 2015-12-22 ENCOUNTER — Ambulatory Visit (INDEPENDENT_AMBULATORY_CARE_PROVIDER_SITE_OTHER): Payer: 59 | Admitting: Internal Medicine

## 2015-12-22 VITALS — BP 140/92 | HR 83 | Temp 97.7°F | Resp 16 | Ht 67.0 in | Wt 188.2 lb

## 2015-12-22 DIAGNOSIS — J0410 Acute tracheitis without obstruction: Secondary | ICD-10-CM

## 2015-12-22 DIAGNOSIS — J0140 Acute pansinusitis, unspecified: Secondary | ICD-10-CM | POA: Diagnosis not present

## 2015-12-22 MED ORDER — PREDNISONE 20 MG PO TABS: ORAL_TABLET | ORAL | 0 refills | Status: DC

## 2015-12-22 MED ORDER — AZITHROMYCIN 250 MG PO TABS: ORAL_TABLET | ORAL | 1 refills | Status: DC

## 2015-12-22 NOTE — Progress Notes (Signed)
[facility]   Unk Pinto, M.D.    Uvaldo Bristle. Silverio Lay, P.A.-C      Starlyn Skeans, P.A.-C  [facility]                [address] [place], [place] [id number] Telephone [phone] Telefax [phone] Subjective:    Patient ID: Joyce Shannon, female    DOB: 1955/09/29, 60 y.o.   MRN: [id number]  HPI  This very nice 60 yo MWF w/T2_DM presenting with several day hx/o head & chest congestion and chills, fever, sweats and productive cough of a yellowish green sputum. No rash, dyspnea.  Medication Sig  . ALPRAZolam  0.25 MG Take 1 tablet (0.25 mg total) by mouth 2 (two) times daily.  Marland Kitchen amLODipine-benazepril  10-40 MG Take 1 capsule by mouth daily.  . INVOKANA 300 MG  take 1/2 tab before breakfast daily)  . metFORMIN-XR 500 MG   take 1 tablet daily in am)   Allergies  Allergen Reactions  . Other     Tylenol #3  . Tylox [Oxycodone-Acetaminophen]    Past Medical History:  Diagnosis Date  . Anemia   . Asthma   . Depression   . Diabetes mellitus without complication ([place])   . Hyperlipidemia   . Hypertension   . Right bundle branch block 05/2005  . Vitamin D deficiency    Review of Systems  10 point systems review negative except as above.    Objective:   Physical Exam  BP (!) 140/92   Pulse 83   Temp 97.7 F (36.5 C)   Resp 16   Ht 5\' 7"  (1.702 m)   Wt 188 lb 3.2 oz (85.4 kg)   SpO2 99%   BMI 29.48 kg/m   No distress. No stridor. Exposed skin w/o rash, cyanosis.   HEENT - Eac's patent. TM's Nl. EOM's full. PERRLA. (+) tender Maxillary > frontal sinuses. Naso OroPharynx clear. Neck - supple. Nl Thyroid. Carotids 2+ & No bruits, nodes, JVD Chest - few scattered  Rales & rhonchi and no wheezes. Cor - Nl HS. RRR w/o sig MGR.  MS-Muscle power, tone and bulk Nl. Gait Nl. Neuro - Nl w/o focal abnormalities.    Assessment & Plan:   1. Tracheitis  - predniSONE (DELTASONE) 20 MG  tablet; 1 tab 3 x day for 3 days, then 1 tab 2 x day for 3 days, then 1 tab 1 x day for 5 days  Dispense: 20 tablet; Refill: 0 - azithromycin (ZITHROMAX) 250 MG tablet; Take 2 tablets (500 mg) on  Day 1,  followed by 1 tablet (250 mg) once daily on Days 2 through 5.  Dispense: 6 each; Refill: 1  2. Acute pansinusitis, recurrence not specified  - predniSONE (DELTASONE) 20 MG tablet; 1 tab 3 x day for 3 days, then 1 tab 2 x day for 3 days, then 1 tab 1 x day for 5 days  Dispense: 20 tablet; Refill: 0 - azithromycin (ZITHROMAX) 250 MG tablet; Take 2 tablets (500 mg) on  Day 1,  followed by 1 tablet (250 mg) once daily on Days 2 through 5.  Dispense: 6 each; Refill: 1  - discussed meds & SE's . ROV - prn - advised close CBG monitoring on Prednisone

## 2015-12-26 ENCOUNTER — Other Ambulatory Visit: Payer: Self-pay | Admitting: *Deleted

## 2015-12-26 ENCOUNTER — Telehealth: Payer: Self-pay | Admitting: *Deleted

## 2015-12-26 ENCOUNTER — Other Ambulatory Visit: Payer: Self-pay | Admitting: Internal Medicine

## 2015-12-26 MED ORDER — DOXYCYCLINE HYCLATE 100 MG PO CAPS: ORAL_CAPSULE | ORAL | 1 refills | Status: DC

## 2015-12-26 MED ORDER — DOXYCYCLINE HYCLATE 100 MG PO CAPS: ORAL_CAPSULE | ORAL | 1 refills | Status: AC

## 2015-12-26 NOTE — Telephone Encounter (Signed)
Patient called and asked to get a different RX to replace the Z-pak due to side effects of dizziness and difficulty breathing. Per Dr Melford Aase, an RX was sent to [facility] for Doxycycline and a message was left to inform the patient.

## 2015-12-31 ENCOUNTER — Other Ambulatory Visit: Payer: Self-pay | Admitting: Physician Assistant

## 2016-03-19 ENCOUNTER — Encounter: Payer: Self-pay | Admitting: Physician Assistant

## 2016-03-19 ENCOUNTER — Ambulatory Visit (INDEPENDENT_AMBULATORY_CARE_PROVIDER_SITE_OTHER): Payer: 59 | Admitting: Physician Assistant

## 2016-03-19 VITALS — BP 140/80 | HR 94 | Temp 97.3°F | Resp 14 | Ht 67.0 in | Wt 193.2 lb

## 2016-03-19 DIAGNOSIS — F419 Anxiety disorder, unspecified: Secondary | ICD-10-CM

## 2016-03-19 DIAGNOSIS — Z0001 Encounter for general adult medical examination with abnormal findings: Secondary | ICD-10-CM

## 2016-03-19 DIAGNOSIS — E559 Vitamin D deficiency, unspecified: Secondary | ICD-10-CM

## 2016-03-19 DIAGNOSIS — Z1159 Encounter for screening for other viral diseases: Secondary | ICD-10-CM

## 2016-03-19 DIAGNOSIS — N181 Chronic kidney disease, stage 1: Secondary | ICD-10-CM | POA: Diagnosis not present

## 2016-03-19 DIAGNOSIS — R6889 Other general symptoms and signs: Secondary | ICD-10-CM

## 2016-03-19 DIAGNOSIS — Z79899 Other long term (current) drug therapy: Secondary | ICD-10-CM

## 2016-03-19 DIAGNOSIS — E785 Hyperlipidemia, unspecified: Secondary | ICD-10-CM | POA: Diagnosis not present

## 2016-03-19 DIAGNOSIS — Z23 Encounter for immunization: Secondary | ICD-10-CM | POA: Diagnosis not present

## 2016-03-19 DIAGNOSIS — Z136 Encounter for screening for cardiovascular disorders: Secondary | ICD-10-CM | POA: Diagnosis not present

## 2016-03-19 DIAGNOSIS — D649 Anemia, unspecified: Secondary | ICD-10-CM | POA: Diagnosis not present

## 2016-03-19 DIAGNOSIS — M25551 Pain in right hip: Secondary | ICD-10-CM

## 2016-03-19 DIAGNOSIS — I10 Essential (primary) hypertension: Secondary | ICD-10-CM

## 2016-03-19 DIAGNOSIS — E1122 Type 2 diabetes mellitus with diabetic chronic kidney disease: Secondary | ICD-10-CM

## 2016-03-19 DIAGNOSIS — Z720 Tobacco use: Secondary | ICD-10-CM

## 2016-03-19 LAB — LIPID PANEL
CHOLESTEROL: 187 mg/dL (ref <200)
HDL: 41 mg/dL — ABNORMAL LOW (ref >50)
LDL Cholesterol: 118 mg/dL — ABNORMAL HIGH (ref <100)
TRIGLYCERIDES: 138 mg/dL (ref <150)
Total CHOL/HDL Ratio: 4.6 Ratio (ref <5.0)
VLDL: 28 mg/dL (ref <30)

## 2016-03-19 LAB — CBC WITH DIFFERENTIAL/PLATELET
BASOS ABS: 0 {cells}/uL (ref 0–200)
Basophils Relative: 0 %
EOS ABS: 246 {cells}/uL (ref 15–500)
EOS PCT: 2 %
HCT: 42.9 % (ref 35.0–45.0)
Hemoglobin: 14.4 g/dL (ref 11.7–15.5)
LYMPHS PCT: 29 %
Lymphs Abs: 3567 cells/uL (ref 850–3900)
MCH: 30.7 pg (ref 27.0–33.0)
MCHC: 33.6 g/dL (ref 32.0–36.0)
MCV: 91.5 fL (ref 80.0–100.0)
MONOS PCT: 7 %
MPV: 9 fL (ref 7.5–12.5)
Monocytes Absolute: 861 cells/uL (ref 200–950)
NEUTROS ABS: 7626 {cells}/uL (ref 1500–7800)
NEUTROS PCT: 62 %
PLATELETS: 275 10*3/uL (ref 140–400)
RBC: 4.69 MIL/uL (ref 3.80–5.10)
RDW: 13.8 % (ref 11.0–15.0)
WBC: 12.3 10*3/uL — ABNORMAL HIGH (ref 3.8–10.8)

## 2016-03-19 LAB — HEPATIC FUNCTION PANEL
ALT: 7 U/L (ref 6–29)
AST: 10 U/L (ref 10–35)
Albumin: 4.3 g/dL (ref 3.6–5.1)
Alkaline Phosphatase: 63 U/L (ref 33–130)
BILIRUBIN DIRECT: 0.1 mg/dL (ref <=0.2)
BILIRUBIN TOTAL: 0.4 mg/dL (ref 0.2–1.2)
Indirect Bilirubin: 0.3 mg/dL (ref 0.2–1.2)
Total Protein: 7.2 g/dL (ref 6.1–8.1)

## 2016-03-19 LAB — BASIC METABOLIC PANEL WITH GFR
BUN: 7 mg/dL (ref 7–25)
CALCIUM: 10.1 mg/dL (ref 8.6–10.4)
CHLORIDE: 105 mmol/L (ref 98–110)
CO2: 27 mmol/L (ref 20–31)
CREATININE: 0.74 mg/dL (ref 0.50–0.99)
GFR, Est African American: >89 mL/min (ref >=60)
GFR, Est Non African American: 87 mL/min (ref >=60)
Glucose, Bld: 86 mg/dL (ref 65–99)
Potassium: 3.9 mmol/L (ref 3.5–5.3)
SODIUM: 141 mmol/L (ref 135–146)

## 2016-03-19 LAB — IRON AND TIBC
%SAT: 13 % (ref 11–50)
Iron: 40 ug/dL — ABNORMAL LOW (ref 45–160)
TIBC: 297 ug/dL (ref 250–450)
UIBC: 257 ug/dL (ref 125–400)

## 2016-03-19 MED ORDER — MELOXICAM 15 MG PO TABS: ORAL_TABLET | ORAL | 1 refills | Status: DC

## 2016-03-19 MED ORDER — UMECLIDINIUM-VILANTEROL 62.5-25 MCG/INH IN AEPB: 1.0000 | INHALATION_SPRAY | Freq: Every day | RESPIRATORY_TRACT | 3 refills | Status: DC

## 2016-03-19 NOTE — Patient Instructions (Signed)
If you have a smart phone, please look up Smoke Free app, this will help you stay on track and give you information about money you have saved, life that you have gained back and a ton of more information.   We are giving you chantix for smoking cessation. You can do it! And we are here to help! You may have heard some scary side effects about chantix, the three most common I hear about are nausea, crazy dreams and depression.  However, I like for my patients to try to stay on 1/2 a tablet twice a day rather than one tablet twice a day as normally prescribed. This helps decrease the chances of side effects and helps save money by making a one month prescription last two months  Please start the prescription this way:  Start 1/2 tablet by mouth once daily after food with a full glass of water for 3 days Then do 1/2 tablet by mouth twice daily for 4 days. During this first week you can smoke, but try to stop after this week.  At this point we have several options: 1) continue on 1/2 tablet twice a day- which I encourage you to do. You can stay on this dose the rest of the time on the medication or if you still feel the need to smoke you can do one of the two options below. 2) do one tablet in the morning and 1/2 in the evening which helps decrease dreams. 3) do one tablet twice a day.   What if I miss a dose? If you miss a dose, take it as soon as you can. If it is almost time for your next dose, take only that dose. Do not take double or extra doses.  What should I watch for while using this medicine? Visit your doctor or health care professional for regular check ups. Ask for ongoing advice and encouragement from your doctor or healthcare professional, friends, and family to help you quit. If you smoke while on this medication, quit again  Your mouth may get dry. Chewing sugarless gum or hard candy, and drinking plenty of water may help. Contact your doctor if the problem does not go away or is  severe.  You may get drowsy or dizzy. Do not drive, use machinery, or do anything that needs mental alertness until you know how this medicine affects you. Do not stand or sit up quickly, especially if you are an older patient.   The use of this medicine may increase the chance of suicidal thoughts or actions. Pay special attention to how you are responding while on this medicine. Any worsening of mood, or thoughts of suicide or dying should be reported to your health care professional right away.  ADVANTAGES OF QUITTING SMOKING  Within 20 minutes, blood pressure decreases. Your pulse is at normal level.  After 8 hours, carbon monoxide levels in the blood return to normal. Your oxygen level increases.  After 24 hours, the chance of having a heart attack starts to decrease. Your breath, hair, and body stop smelling like smoke.  After 48 hours, damaged nerve endings begin to recover. Your sense of taste and smell improve.  After 72 hours, the body is virtually free of nicotine. Your bronchial tubes relax and breathing becomes easier.  After 2 to 12 weeks, lungs can hold more air. Exercise becomes easier and circulation improves.  After 1 year, the risk of coronary heart disease is cut in half.  After 5 years,   the risk of stroke falls to the same as a nonsmoker.  After 10 years, the risk of lung cancer is cut in half and the risk of other cancers decreases significantly.  After 15 years, the risk of coronary heart disease drops, usually to the level of a nonsmoker.  You will have extra money to spend on things other than cigarettes.   Simple math prevails.    1st - exercise does not produce significant weight loss - at best one converts fat into muscle , "bulks up", loses inches, but usually stays "weight neutral"     2nd - think of your body weightas a check book: If you eat more calories than you burn up - you save money or gain weight .... Or if you spend more money than you put in  the check book, ie burn up more calories than you eat, then you lose weight     3rd - if you walk or run 1 mile, you burn up 100 calories - you have to burn up 3,500 calories to lose 1 pound, ie you have to walk/run 35 miles to lose 1 measly pound. So if you want to lose 10 #, then you have to walk/run 350 miles, so.... clearly exercise is not the solution.     4. So if you consume 1,500 calories, then you have to burn up the equivalent of 15 miles to stay weight neutral - It also stands to reason that if you consume 1,500 cal/day and don't lose weight, then you must be burning up about 1,500 cals/day to stay weight neutral.     5. If you really want to lose weight, you must cut your calorie intake 300 calories /day and at that rate you should lose about 1 # every 3 days.   6. Please purchase Dr Fara Olden Fuhrman's book(s) "The End of Dieting" & "Eat to Live" . It has some great concepts and recipes.       Hip Pain Your hip is the joint between your upper legs and your lower pelvis. The bones, cartilage, tendons, and muscles of your hip joint perform a lot of work each day supporting your body weight and allowing you to move around. Hip pain can range from a minor ache to severe pain in one or both of your hips. Pain may be felt on the inside of the hip joint near the groin, or the outside near the buttocks and upper thigh. You may have swelling or stiffness as well. Follow these instructions at home:  Take medicines only as directed by your health care provider.  Apply ice to the injured area:  Put ice in a plastic bag.  Place a towel between your skin and the bag.  Leave the ice on for 15-20 minutes at a time, 3-4 times a day.  Keep your leg raised (elevated) when possible to lessen swelling.  Avoid activities that cause pain.  Follow specific exercises as directed by your health care provider.  Sleep with a pillow between your legs on your most comfortable side.  Record how often  you have hip pain, the location of the pain, and what it feels like. Contact a health care provider if:  You are unable to put weight on your leg.  Your hip is red or swollen or very tender to touch.  Your pain or swelling continues or worsens after 1 week.  You have increasing difficulty walking.  You have a fever. Get help right away if:  You have fallen.  You have a sudden increase in pain and swelling in your hip. This information is not intended to replace advice given to you by your health care provider. Make sure you discuss any questions you have with your health care provider. Document Released: 07/12/2009 Document Revised: 06/30/2015 Document Reviewed: 09/18/2012 Elsevier Interactive Patient Education  2017 [employer].

## 2016-03-19 NOTE — Progress Notes (Signed)
Complete Physical  Assessment and Plan:  Encounter for general adult medical examination with abnormal findings  Essential hypertension - continue medications, DASH diet, exercise and monitor at home. Call if greater than 130/80.  -     CBC with Differential/Platelet -     BASIC METABOLIC PANEL WITH GFR -     Hepatic function panel -     TSH -     Urinalysis, Routine w reflex microscopic -     Microalbumin / creatinine urine ratio -     EKG 12-Lead  CKD stage 1 due to type 2 diabetes mellitus ([place]) Discussed general issues about diabetes pathophysiology and management., Educational material distributed., Suggested low cholesterol diet., Encouraged aerobic exercise., Discussed foot care., Reminded to get yearly retinal exam. -     Hemoglobin A1c  Hyperlipidemia, unspecified hyperlipidemia type -continue medications, check lipids, decrease fatty foods, increase activity.  -     Lipid panel  Tobacco use Not ready to quit at this time Will get low dose CT, discussed benefits and risk, patient agrees with the plan, no symptoms, continued smoking, >30 pack year history -     EKG 12-Lead -     CT CHEST LUNG CANCER SCREENING LOW DOSE WO CONTRAST; Future  Vitamin D deficiency -     VITAMIN D 25 Hydroxy (Vit-D Deficiency, Fractures)  Anxiety Continue meds PRN  Encounter for long-term (current) use of medications -     Magnesium  Need for diphtheria-tetanus-pertussis (Tdap) vaccine, adult/adolescent -     Tdap vaccine greater than or equal to 7yo IM  Need for prophylactic vaccination against Streptococcus pneumoniae (pneumococcus) -     Pneumococcal polysaccharide vaccine 23-valent greater than or equal to 2yo subcutaneous/IM  Right hip pain No back pain, ? Strain versus OA hip, check Xray, mobic -     DG HIP UNILAT W OR W/O PELVIS 2-3 VIEWS RIGHT; Future  Anemia, unspecified type -     Iron and TIBC -     Ferritin -     Vitamin B12  Screening for viral disease -     HIV  antibody -     Hepatitis C antibody   Discussed med's effects and SE's. Screening labs and tests as requested with regular follow-up as recommended. Over 40 minutes of exam, counseling, chart review, and complex, high level critical decision making was performed this visit.   HPI  61 y.o. female  presents for a complete physical and follow up for has Essential hypertension; CKD stage 1 due to type 2 diabetes mellitus ([place]); Anxiety; Encounter for long-term (current) use of medications; Hyperlipidemia; Tobacco use; Obesity; and Vitamin D deficiency on her problem list..  Her blood pressure has been controlled at work, she checks it occ, today their BP is BP: 140/80  BP Readings from Last 3 Encounters:  03/19/16 140/80  12/22/15 (!) 140/92  12/07/15 100/65   She does workout, walking, running, she will have some SOB with running only, not with inclines, no accompaniments. She denies chest pain,  Dizziness, diaphoresis. 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  She has been having right anterior hip pain x Nov, worse with walking/getting out of the car, no pain down her legs, has had some lateral leg pain if she walks a lot, feels weak at time. Some lower back  pain/pain in "pelvic area" normal xray 2005 right hip.  Has taken some tylenol only once.  She is not on cholesterol medication and denies myalgias. Her cholesterol is not at goal. The cholesterol last visit was:   Lab Results  Component Value Date   CHOL 184 11/01/2015   HDL 41 (L) 11/01/2015   LDLCALC 116 11/01/2015   TRIG 135 11/01/2015   CHOLHDL 4.5 11/01/2015   She has been working on diet and exercise for prediabetes, she is on bASA, she is on ACE/ARB, she is on metformin taken off invokana last visit due to doing better and denies paresthesia of the feet, polydipsia,  polyuria and visual disturbances. Last A1C in the office was:  Lab Results  Component Value Date   HGBA1C 6.0 (H) 11/01/2015   Last GFR: Lab Results  Component Value Date   [facility] 64 11/01/2015   Patient is on Vitamin D supplement.   Lab Results  Component Value Date   VD25OH 64 11/01/2015     BMI is Body mass index is 30.26 kg/m., she is working on diet and exercise. Wt Readings from Last 3 Encounters:  03/19/16 193 lb 3.2 oz (87.6 kg)  12/22/15 188 lb 3.2 oz (85.4 kg)  12/07/15 190 lb (86.2 kg)    Current Medications:  Current Outpatient Prescriptions on File Prior to Visit  Medication Sig Dispense Refill  . ALPRAZolam (XANAX) 0.25 MG tablet Take 1 tablet (0.25 mg total) by mouth 2 (two) times daily. 60 tablet 0  . amLODipine-benazepril (LOTREL) 10-40 MG capsule TAKE 1 CAPSULE BY MOUTH DAILY. 90 capsule 1  . INVOKANA 300 MG TABS tablet TAKE 1 TABLET BY MOUTH EVERY MORNING BEFORE BREAKFAST (Patient taking differently: take 1/2 tab before breakfast daily) 90 tablet 1  . metFORMIN (GLUCOPHAGE-XR) 500 MG 24 hr tablet TAKE 3 TABLETS DAILY FOR DIABETES (MAXIMUM DOSE IS 4 TABLETS/DAY) (Patient taking differently: take 1 tablet daily in am) 270 tablet 1   Current Facility-Administered Medications on File Prior to Visit  Medication Dose Route Frequency Provider Last Rate Last Dose  . 0.9 %  sodium chloride infusion  500 mL Intravenous Continuous Kavitha Nandigam V, MD      . 0.9 %  sodium chloride infusion  500 mL Intravenous Continuous Manus Gunning, MD       Allergies:  Allergies  Allergen Reactions  . Other     Tylenol #3  . Tylox [Oxycodone-Acetaminophen]    Medical History:  She has Essential hypertension; CKD stage 1 due to type 2 diabetes mellitus ([place]); Anxiety; Encounter for long-term (current) use of medications; Hyperlipidemia; Tobacco use; Obesity; and Vitamin D deficiency on her problem list.   Health Maintenance:   Immunization History  Administered  Date(s) Administered  . Influenza-Unspecified 10/28/2014  . Pneumococcal Polysaccharide-23 03/19/2016  . Td 10/07/2003  . Tdap 03/19/2016   Tetanus: 2005 Pneumovax: DUE Prevnar 13:  NA Flu vaccine: declines Zostavax: N/A  LMP s/p hysterectomy Pap: 2008 before hysterectomy MGM: 0/2017 DEXA: due age 72 Colonoscopy:12/2015 [place] vaginal 2008 CXR 2008  Last Dental Exam: Friendly denistry Last Eye Exam: glasses, Lens crafter's Patient Care Team: Unk Pinto, MD as PCP - General (Internal Medicine)  Surgical History:  She has a past surgical history that includes Cesarean section; Abdominal hysterectomy; Cholecystectomy; ovarian cystectomy (1985); and Tonsillectomy. Family History:  Herfamily history includes Cancer in her father and mother; Congestive Heart Failure in her father; Diabetes in her mother; Heart attack in her father; Heart disease in her  father and mother; Hypertension in her father; Multiple sclerosis in her brother. Social History:  She reports that she has been smoking Cigarettes.  She has a 30.00 pack-year smoking history. She has never used smokeless tobacco. She reports that she does not drink alcohol or use drugs.  Review of Systems: Review of Systems  Constitutional: Negative.  Negative for weight loss.  HENT: Negative for congestion, ear discharge, ear pain, hearing loss, nosebleeds, sore throat and tinnitus.   Eyes: Negative.   Respiratory: Negative.  Negative for cough, hemoptysis, sputum production, wheezing and stridor.   Cardiovascular: Negative.   Gastrointestinal: Negative for abdominal pain, blood in stool, constipation, diarrhea, heartburn, melena, nausea and vomiting.  Genitourinary: Negative.   Musculoskeletal: Positive for joint pain (right hip). Negative for back pain, falls, myalgias and neck pain.  Skin: Negative.   Neurological: Negative for dizziness, tingling, tremors, sensory change, speech change, focal weakness, seizures, loss of  consciousness and headaches.  Psychiatric/Behavioral: Negative.     Physical Exam: Estimated body mass index is 30.26 kg/m as calculated from the following:   Height as of this encounter: 5\' 7"  (1.702 m).   Weight as of this encounter: 193 lb 3.2 oz (87.6 kg). BP 140/80   Pulse 94   Temp 97.3 F (36.3 C)   Resp 14   Ht 5\' 7"  (1.702 m)   Wt 193 lb 3.2 oz (87.6 kg)   SpO2 97%   BMI 30.26 kg/m  General Appearance: Well nourished, in no apparent distress.  Eyes: PERRLA, EOMs, conjunctiva no swelling or erythema, normal fundi and vessels.  Sinuses: No Frontal/maxillary tenderness  ENT/Mouth: Ext aud canals clear, normal light reflex with TMs without erythema, bulging. Good dentition. No erythema, swelling, or exudate on post pharynx. Tonsils not swollen or erythematous. Hearing normal.  Neck: Supple, thyroid normal. No bruits  Respiratory: Respiratory effort normal, BS equal bilaterally without rales, rhonchi, wheezing or stridor.  Cardio: RRR without murmurs, rubs or gallops. Brisk peripheral pulses without edema.  Chest: symmetric, with normal excursions and percussion.  Breasts: Symmetric, without lumps, nipple discharge, retractions.  Abdomen: Soft, nontender, no guarding, rebound, hernias, masses, or organomegaly.  Lymphatics: Non tender without lymphadenopathy.  Genitourinary:  Musculoskeletal: Full ROM all peripheral extremities,5/5 strength, and normal gait, + tenderness at right anterior hip, no Si tenderness, no trochanteric tenderness, full ROM, pain with flexion right hip.   Skin: Warm, dry without rashes, lesions, ecchymosis. Neuro: Cranial nerves intact, reflexes equal bilaterally. Normal muscle tone, no cerebellar symptoms. Sensation intact.  Psych: Awake and oriented X 3, normal affect, Insight and Judgment appropriate.   EKG: WNL, IRBBB no ST changes. AORTA SCAN: defer  Vicie Mutters 3:23 PM [facility]

## 2016-03-20 ENCOUNTER — Ambulatory Visit (HOSPITAL_COMMUNITY)
Admission: RE | Admit: 2016-03-20 | Discharge: 2016-03-20 | Disposition: A | Discharge status: Home / Self Care | Payer: 59 | Source: Ambulatory Visit | Attending: Physician Assistant | Admitting: Physician Assistant

## 2016-03-20 DIAGNOSIS — M25551 Pain in right hip: Secondary | ICD-10-CM | POA: Insufficient documentation

## 2016-03-20 LAB — FERRITIN: Ferritin: 103 ng/mL (ref 20–288)

## 2016-03-20 LAB — URINALYSIS, ROUTINE W REFLEX MICROSCOPIC
Bilirubin Urine: NEGATIVE
Glucose, UA: NEGATIVE
Hgb urine dipstick: NEGATIVE
Ketones, ur: NEGATIVE
Leukocytes, UA: NEGATIVE
NITRITE: NEGATIVE
PROTEIN: NEGATIVE
Specific Gravity, Urine: 1.004 (ref 1.001–1.035)
pH: 7.5 (ref 5.0–8.0)

## 2016-03-20 LAB — TSH: TSH: 3.18 m[IU]/L

## 2016-03-20 LAB — MAGNESIUM: Magnesium: 1.9 mg/dL (ref 1.5–2.5)

## 2016-03-20 LAB — MICROALBUMIN / CREATININE URINE RATIO
CREATININE, URINE: 25 mg/dL (ref 20–320)
MICROALB UR: 1.6 mg/dL
MICROALB/CREAT RATIO: 64 ug/mg{creat} — AB (ref <30)

## 2016-03-20 LAB — VITAMIN B12: Vitamin B-12: 344 pg/mL (ref 200–1100)

## 2016-03-20 LAB — HEMOGLOBIN A1C
Hgb A1c MFr Bld: 6.1 % — ABNORMAL HIGH (ref <5.7)
Mean Plasma Glucose: 128 mg/dL

## 2016-03-20 LAB — HEPATITIS C ANTIBODY: HCV AB: NEGATIVE

## 2016-03-20 LAB — HIV ANTIBODY (ROUTINE TESTING W REFLEX): HIV: NONREACTIVE

## 2016-03-20 LAB — VITAMIN D 25 HYDROXY (VIT D DEFICIENCY, FRACTURES): VIT D 25 HYDROXY: 38 ng/mL (ref 30–100)

## 2016-03-29 ENCOUNTER — Other Ambulatory Visit: Payer: Self-pay | Admitting: Internal Medicine

## 2016-03-29 DIAGNOSIS — E119 Type 2 diabetes mellitus without complications: Secondary | ICD-10-CM

## 2016-03-29 NOTE — Telephone Encounter (Signed)
Rx sent to pharmacy on 22nd Feb 2018 @ 8:14am by DD

## 2016-05-01 ENCOUNTER — Encounter: Payer: Self-pay | Admitting: Internal Medicine

## 2016-05-21 ENCOUNTER — Other Ambulatory Visit: Payer: Self-pay | Admitting: Physician Assistant

## 2016-05-21 DIAGNOSIS — M25551 Pain in right hip: Secondary | ICD-10-CM

## 2016-06-25 ENCOUNTER — Ambulatory Visit: Payer: Self-pay | Admitting: Physician Assistant

## 2016-06-28 ENCOUNTER — Other Ambulatory Visit: Payer: Self-pay | Admitting: Internal Medicine

## 2016-07-21 ENCOUNTER — Other Ambulatory Visit: Payer: Self-pay | Admitting: Physician Assistant

## 2016-07-21 DIAGNOSIS — M25551 Pain in right hip: Secondary | ICD-10-CM

## 2016-07-26 ENCOUNTER — Other Ambulatory Visit: Payer: Self-pay | Admitting: Physician Assistant

## 2016-07-26 DIAGNOSIS — E119 Type 2 diabetes mellitus without complications: Secondary | ICD-10-CM

## 2016-08-27 NOTE — Progress Notes (Signed)
Assessment and Plan:   Hypertension -Continue medication, monitor blood pressure at home. Continue DASH diet.  Reminder to go to the ER if any CP, SOB, nausea, dizziness, severe HA, changes vision/speech, left arm numbness and tingling and jaw pain.  Cholesterol -Continue diet and exercise. Check cholesterol.    Diabetes with diabetic chronic kidney disease -Continue diet and exercise. Check A1C Continue invokana and metformin   Vitamin D Def - check level and continue medications.    Obesity with co morbidities - long discussion about weight loss, diet, and exercise  Tobacco use Smoking cessation-  instruction/counseling given, counseled patient on the dangers of tobacco use, advised patient to stop smoking, and reviewed strategies to maximize success, patient not ready to quit at this time.  Continue diet and meds as discussed.    Contact dermatitis due to poison oak Can use benadryl, calamine lotion, contact precautions discussed.   Further disposition pending results of labs. Discussed med's effects and SE's.   Over 30 minutes of exam, counseling, chart review, and critical decision making was performed Future Appointments Date Time Provider [place]  03/20/2017 2:00 PM Vicie Mutters, PA-C [facility] None     HPI 61 y.o. smoking W female  presents for 3 month follow up on hypertension, cholesterol, diabetes and vitamin D deficiency.   Her blood pressure has been controlled at home, today her BP is BP: 118/74.  She does not workout. She denies chest pain, shortness of breath, dizziness. She is primary care given for her husband, who is has spinal stenosis and has transitioned to a cane and doing better. Will take xanax occ for panic attacks.  She is not on cholesterol medication and denies myalgias. Her cholesterol is not at goal. The cholesterol was:   Lab Results  Component Value Date   CHOL 187 03/19/2016   HDL 41 (L) 03/19/2016   LDLCALC 118 (H) 03/19/2016    TRIG 138 03/19/2016   CHOLHDL 4.6 03/19/2016    She has been working on diet and exercise for diabetes with diabetic chronic kidney disease,  Lab Results  Component Value Date   GFRNONAA 87 03/19/2016   , she is on bASA, she is on ACE/ARB,she is invokana and metformin, and denies  paresthesia of the feet, polydipsia, polyuria and visual disturbances. Last A1C was:  Lab Results  Component Value Date   HGBA1C 6.1 (H) 03/19/2016    Patient is not on Vitamin D supplement, has been on 5000 IU daily Lab Results  Component Value Date   VD25OH 38 03/19/2016   BMI is Body mass index is 30.2 kg/m., she is working on diet and exercise. Wt Readings from Last 3 Encounters:  08/28/16 192 lb 12.8 oz (87.5 kg)  03/19/16 193 lb 3.2 oz (87.6 kg)  12/22/15 188 lb 3.2 oz (85.4 kg)    Current Medications:  Current Outpatient Prescriptions on File Prior to Visit  Medication Sig Dispense Refill  . amLODipine-benazepril (LOTREL) 10-40 MG capsule TAKE ONE CAPSULE BY MOUTH EVERY DAY 90 capsule 0   Current Facility-Administered Medications on File Prior to Visit  Medication Dose Route Frequency Provider Last Rate Last Dose  . 0.9 %  sodium chloride infusion  500 mL Intravenous Continuous Nandigam, Kavitha V, MD      . 0.9 %  sodium chloride infusion  500 mL Intravenous Continuous Armbruster, Renelda Loma, MD       Medical History:  Past Medical History:  Diagnosis Date  . Anemia   . Asthma   .  Depression   . Diabetes mellitus without complication ([place])   . Hyperlipidemia   . Hypertension   . Right bundle branch block 05/2005  . Vitamin D deficiency    Allergies:  Allergies  Allergen Reactions  . Other     Tylenol #3  . Tylox [Oxycodone-Acetaminophen]      Review of Systems:  Review of Systems  Constitutional: Negative.   HENT: Negative for congestion, ear discharge, ear pain, hearing loss, nosebleeds, sore throat and tinnitus.   Eyes: Negative.   Respiratory: Negative.  Negative for  stridor.   Cardiovascular: Negative.   Gastrointestinal: Positive for constipation. Negative for abdominal pain, blood in stool, diarrhea, heartburn, melena, nausea and vomiting.  Genitourinary: Negative.   Musculoskeletal: Negative.   Skin: Positive for rash.  Neurological: Negative for dizziness, tingling, tremors, sensory change, speech change, focal weakness, seizures, loss of consciousness and headaches.  Psychiatric/Behavioral: Negative.     Family history- Review and unchanged Social history- Review and unchanged Physical Exam: BP 118/74   Pulse 76   Temp 97.7 F (36.5 C)   Resp 16   Ht 5\' 7"  (1.702 m)   Wt 192 lb 12.8 oz (87.5 kg)   BMI 30.20 kg/m  Wt Readings from Last 3 Encounters:  08/28/16 192 lb 12.8 oz (87.5 kg)  03/19/16 193 lb 3.2 oz (87.6 kg)  12/22/15 188 lb 3.2 oz (85.4 kg)   General Appearance: Well nourished, in no apparent distress. Eyes: PERRLA, EOMs, conjunctiva no swelling or erythema Sinuses: + Frontal/maxillary tenderness ENT/Mouth: Ext aud canals clear, TMs without erythema, bulging. No erythema, swelling, or exudate on post pharynx.  Tonsils not swollen or erythematous. Hearing normal.  Neck: Supple, thyroid normal.  Respiratory: Respiratory effort normal, BS equal bilaterally without rales, rhonchi, wheezing or stridor.  Cardio: RRR with no MRGs. Brisk peripheral pulses without edema.  Abdomen: Soft, + BS.  Non tender, no guarding, rebound, hernias, masses. Lymphatics: Non tender without lymphadenopathy.  Musculoskeletal: Full ROM, 5/5 strength, Normal gait Skin: On bilateral upper extremity, linear erythematous vesicles. No warmth, surrounding erythema, discharge.  Warm, dry without rashes, lesions, ecchymosis.  Neuro: Cranial nerves intact. No cerebellar symptoms.  Psych: Awake and oriented X 3, normal affect, Insight and Judgment appropriate.    Vicie Mutters, PA-C 5:10 PM [facility]

## 2016-08-28 ENCOUNTER — Encounter: Payer: Self-pay | Admitting: Physician Assistant

## 2016-08-28 ENCOUNTER — Ambulatory Visit (INDEPENDENT_AMBULATORY_CARE_PROVIDER_SITE_OTHER): Payer: 59 | Admitting: Physician Assistant

## 2016-08-28 VITALS — BP 118/74 | HR 76 | Temp 97.7°F | Resp 16 | Ht 67.0 in | Wt 192.8 lb

## 2016-08-28 DIAGNOSIS — Z720 Tobacco use: Secondary | ICD-10-CM

## 2016-08-28 DIAGNOSIS — I10 Essential (primary) hypertension: Secondary | ICD-10-CM

## 2016-08-28 DIAGNOSIS — N181 Chronic kidney disease, stage 1: Secondary | ICD-10-CM | POA: Diagnosis not present

## 2016-08-28 DIAGNOSIS — Z79899 Other long term (current) drug therapy: Secondary | ICD-10-CM

## 2016-08-28 DIAGNOSIS — L237 Allergic contact dermatitis due to plants, except food: Secondary | ICD-10-CM

## 2016-08-28 DIAGNOSIS — E785 Hyperlipidemia, unspecified: Secondary | ICD-10-CM

## 2016-08-28 DIAGNOSIS — E1122 Type 2 diabetes mellitus with diabetic chronic kidney disease: Secondary | ICD-10-CM | POA: Diagnosis not present

## 2016-08-28 MED ORDER — DEXAMETHASONE SODIUM PHOSPHATE 100 MG/10ML IJ SOLN
10.0000 mg | Freq: Once | INTRAMUSCULAR | Status: AC
  Administered 2016-08-28: 10 mg via INTRAMUSCULAR

## 2016-08-28 MED ORDER — TRIAMCINOLONE ACETONIDE 0.1 % EX OINT: 1.0000 "application " | TOPICAL_OINTMENT | Freq: Two times a day (BID) | CUTANEOUS | 1 refills | Status: DC

## 2016-08-28 NOTE — Patient Instructions (Addendum)
Poison Oak Dermatitis Poison oak dermatitis is inflammation of the skin that is caused by contact with the allergens on the leaves of the poison oak (toxicodendron) plant. The skin reaction often includes redness, swelling, blisters, and extreme itching. What are the causes? This condition is caused by a specific chemical (urushiol) that is found in the sap of the poison oak plant. This chemical is sticky and it can be easily spread to people, animals, and objects. You can get poison oak dermatitis by:  Having direct contact with a poison oak plant.  Touching animals, other people, or objects that have come in contact with poison oak and have the chemical on them.  What increases the risk? This condition is more likely to develop in people who:  Are outdoors often.  Go outdoors without wearing protective clothing, such as closed shoes, long pants, and a long-sleeved shirt.  What are the signs or symptoms? Symptoms of this condition include:  Redness of the skin.  A rash that may develop blisters.  Extreme itching.  Swelling. This may occur if the reaction is more severe.  Symptoms usually last for 1-2 weeks. However, the first time you develop this condition, symptoms may last 3-4 weeks. How is this diagnosed? This condition may be diagnosed based on your symptoms and a physical exam. Your health care provider may also ask you about any recent outdoor activity. How is this treated? Treatment for this condition will vary depending on how severe it is. Treatment may include:  Hydrocortisone creams or calamine lotions to relieve itching.  Oatmeal baths to soothe the skin.  Over-the-counter antihistamine tablets.  Oral steroid medicine for more severe outbreaks.  Follow these instructions at home:  Take or apply over-the-counter and prescription medicines only as told by your health care provider.  Wash exposed skin as soon as possible with soap and cold water.  Use  hydrocortisone creams or calamine lotion as needed to soothe the skin and relieve itching.  Take oatmeal baths as needed. Use colloidal oatmeal. You can get this at your local pharmacy or grocery store. Follow the instructions on the packaging.  Do not scratch or rub your skin.  While you have the rash, wash clothes right after you wear them. How is this prevented?  Learn to identify the poison oak plant and avoid contact with the plant. This plant can be recognized by the number of leaves. Generally, poison oak has three leaves with flowering branches on a single stem. The leaves are often a bit fuzzy and have a toothlike edge.  If you have been exposed to poison oak, thoroughly wash with soap and water right away. You have about 30 minutes to remove the plant resin before it will cause the rash. Be sure to wash under your fingernails because any plant resin there will continue to spread the rash.  When hiking or camping, wear clothes that will help you avoid exposure on the skin. This includes long pants, a long-sleeved shirt, tall socks, and hiking boots. You can also apply preventive lotion to your skin to help limit exposure.  If you suspect that your clothes or outdoor gear came in contact with poison oak, rinse them off outside with a garden hose before bringing them inside your house. Contact a health care provider if:  You have open sores in the rash area.  You have more redness, swelling, or pain in the affected area.  You have redness that spreads beyond the rash area.  You have   fluid, blood, or pus coming from the affected area.  You have a fever.  You have a rash over a large area of your body.  You have a rash on your eyes, mouth, or genitals.  Your rash does not improve after a few days. Get help right away if:  Your face swells or your eyes swell shut.  You have trouble breathing.  You have trouble swallowing. This information is not intended to replace advice  given to you by your health care provider. Make sure you discuss any questions you have with your health care provider. Document Released: 07/29/2002 Document Revised: 06/30/2015 Document Reviewed: 06/30/2014 Elsevier Interactive Patient Education  2018 [employer].   Simple math prevails.    1st - exercise does not produce significant weight loss - at best one converts fat into muscle , "bulks up", loses inches, but usually stays "weight neutral"     2nd - think of your body weightas a check book: If you eat more calories than you burn up - you save money or gain weight .... Or if you spend more money than you put in the check book, ie burn up more calories than you eat, then you lose weight     3rd - if you walk or run 1 mile, you burn up 100 calories - you have to burn up 3,500 calories to lose 1 pound, ie you have to walk/run 35 miles to lose 1 measly pound. So if you want to lose 10 #, then you have to walk/run 350 miles, so.... clearly exercise is not the solution.     4. So if you consume 1,500 calories, then you have to burn up the equivalent of 15 miles to stay weight neutral - It also stands to reason that if you consume 1,500 cal/day and don't lose weight, then you must be burning up about 1,500 cals/day to stay weight neutral.     5. If you really want to lose weight, you must cut your calorie intake 300 calories /day and at that rate you should lose about 1 # every 3 days.   6. Please purchase Dr Fara Olden Fuhrman's book(s) "The End of Dieting" & "Eat to Live" . It has some great concepts and recipes.

## 2016-08-29 ENCOUNTER — Other Ambulatory Visit: Payer: Self-pay | Admitting: Internal Medicine

## 2016-09-20 ENCOUNTER — Other Ambulatory Visit: Payer: Self-pay | Admitting: Physician Assistant

## 2016-09-20 ENCOUNTER — Other Ambulatory Visit: Payer: Self-pay | Admitting: Internal Medicine

## 2016-09-20 DIAGNOSIS — M25551 Pain in right hip: Secondary | ICD-10-CM

## 2016-10-20 ENCOUNTER — Other Ambulatory Visit: Payer: Self-pay | Admitting: Physician Assistant

## 2016-10-20 DIAGNOSIS — E119 Type 2 diabetes mellitus without complications: Secondary | ICD-10-CM

## 2016-10-30 ENCOUNTER — Ambulatory Visit (INDEPENDENT_AMBULATORY_CARE_PROVIDER_SITE_OTHER): Payer: 59 | Admitting: Physician Assistant

## 2016-10-30 ENCOUNTER — Encounter: Payer: Self-pay | Admitting: Physician Assistant

## 2016-10-30 VITALS — BP 120/68 | HR 70 | Temp 97.5°F | Resp 14 | Ht 67.0 in | Wt 192.8 lb

## 2016-10-30 DIAGNOSIS — R58 Hemorrhage, not elsewhere classified: Secondary | ICD-10-CM

## 2016-10-30 DIAGNOSIS — R21 Rash and other nonspecific skin eruption: Secondary | ICD-10-CM | POA: Diagnosis not present

## 2016-10-30 DIAGNOSIS — I10 Essential (primary) hypertension: Secondary | ICD-10-CM | POA: Diagnosis not present

## 2016-10-30 DIAGNOSIS — Z79899 Other long term (current) drug therapy: Secondary | ICD-10-CM

## 2016-10-30 DIAGNOSIS — E785 Hyperlipidemia, unspecified: Secondary | ICD-10-CM

## 2016-10-30 DIAGNOSIS — E1122 Type 2 diabetes mellitus with diabetic chronic kidney disease: Secondary | ICD-10-CM

## 2016-10-30 DIAGNOSIS — N181 Chronic kidney disease, stage 1: Secondary | ICD-10-CM

## 2016-10-30 NOTE — Progress Notes (Signed)
Subjective:    Patient ID: Joyce Shannon, female    DOB: 25-Apr-1955, 61 y.o.   MRN: [id number]  HPI 61 y.o. WF presents with rash x July She was treated with prednisone in spring, had vesicles and states after that it became bruised looking or looks like "blood collecting" under the skin. No itching, no pain. No fever, or chills, no rash any where else.  Her blood pressure has been controlled at home, today their BP is BP: 120/68  She does workout. She denies chest pain, shortness of breath, dizziness.  She is not on cholesterol medication and denies myalgias. Her cholesterol is not at goal. The cholesterol last visit was:   Lab Results  Component Value Date   CHOL 187 03/19/2016   HDL 41 (L) 03/19/2016   LDLCALC 118 (H) 03/19/2016   TRIG 138 03/19/2016   CHOLHDL 4.6 03/19/2016    She has been working on diet and exercise for prediabetes, and denies paresthesia of the feet, polydipsia, polyuria and visual disturbances. Last A1C in the office was:  Lab Results  Component Value Date   HGBA1C 6.1 (H) 03/19/2016   Patient is on Vitamin D supplement.   Lab Results  Component Value Date   VD25OH 38 03/19/2016      Blood pressure 120/68, pulse 70, temperature (!) 97.5 F (36.4 C), resp. rate 14, height 5\' 7"  (1.702 m), weight 192 lb 12.8 oz (87.5 kg), SpO2 97 %.  Medications Current Outpatient Prescriptions on File Prior to Visit  Medication Sig  . amLODipine-benazepril (LOTREL) 10-40 MG capsule TAKE ONE CAPSULE BY MOUTH EVERY DAY   Current Facility-Administered Medications on File Prior to Visit  Medication  . 0.9 %  sodium chloride infusion  . 0.9 %  sodium chloride infusion    Problem list She has Essential hypertension; CKD stage 1 due to type 2 diabetes mellitus ([place]); Anxiety; Encounter for long-term (current) use of medications; Hyperlipidemia; Tobacco use; Obesity; and Vitamin D deficiency on her problem list.  Review of Systems  Constitutional: Negative.   Negative for chills and fever.  HENT: Negative.  Negative for sore throat.   Respiratory: Negative.   Cardiovascular: Negative.   Gastrointestinal: Negative.  Negative for diarrhea.  Genitourinary: Negative.   Musculoskeletal: Negative.  Negative for arthralgias.  Skin: Positive for rash and wound. Negative for color change.  Neurological: Negative.  Negative for dizziness.       Objective:   Physical Exam  Constitutional: She is oriented to person, place, and time. She appears well-developed and well-nourished. No distress.  Obese  HENT:  Head: Normocephalic and atraumatic.  Right Ear: External ear normal.  Left Ear: External ear normal.  Nose: Nose normal.  Mouth/Throat: Oropharynx is clear and moist.  Eyes: Conjunctivae and EOM are normal.  Neck: Normal range of motion. Neck supple. No JVD present. No thyromegaly present.  Cardiovascular: Normal rate, regular rhythm, normal heart sounds and intact distal pulses.   Pulmonary/Chest: Effort normal and breath sounds normal.  Abdominal: Soft. Bowel sounds are normal. She exhibits no distension and no mass. There is no tenderness. There is no rebound and no guarding.  Musculoskeletal: Normal range of motion. She exhibits no edema or tenderness.  Lymphadenopathy:    She has no cervical adenopathy.  Neurological: She is alert and oriented to person, place, and time. No cranial nerve deficit.  Skin: Skin is warm and dry. Rash (petechiae on left forearm, no warmth, not raised, no rash anywhere else) noted. No  erythema. No pallor.  Psychiatric: She has a normal mood and affect. Her behavior is normal. Judgment and thought content normal.  Nursing note and vitals reviewed.     Assessment & Plan:   Rash and nonspecific skin eruption -     CBC with Differential/Platelet -     BASIC METABOLIC PANEL WITH GFR -     Hepatic function panel -     Iron,Total/Total Iron Binding Cap -     POCT INR -     APTT  Ecchymosis -     CBC with  Differential/Platelet -     BASIC METABOLIC PANEL WITH GFR -     Hepatic function panel -     Iron,Total/Total Iron Binding Cap -     POCT INR -     APTT  Essential hypertension -     TSH  CKD stage 1 due to type 2 diabetes mellitus ([place]) -     Hemoglobin A1c -     Ambulatory referral to diabetic education  Hyperlipidemia, unspecified hyperlipidemia type -     Lipid panel  Medication management     Future Appointments Date Time Provider [place]  03/20/2017 2:00 PM Vicie Mutters, PA-C [facility] None

## 2016-10-30 NOTE — Patient Instructions (Signed)
Your A1C is a measure of your sugar over the past 3 months and is not affected by what you have eaten over the past few days. Diabetes increases your chances of stroke and heart attack over 300 % and is the leading cause of blindness and kidney failure in the [place]. Please make sure you decrease bad carbs like white bread, white rice, potatoes, corn, soft drinks, pasta, cereals, refined sugars, sweet tea, dried fruits, and fruit juice. Good carbs are okay to eat in moderation like sweet potatoes, brown rice, whole grain pasta/bread, most fruit (except dried fruit) and you can eat as many veggies as you want.   Greater than 6.5 is considered diabetic. Between 6.4 and 5.7 is prediabetic If your A1C is less than 5.7 you are NOT diabetic.  Targets for Glucose Readings: Time of Check Target for patients WITHOUT Diabetes Target for DIABETICS  Before Meals Less than 100  less than 150  Two hours after meals Less than 200  Less than 250    Targets for Glucose Readings: Time of Check Usual Target for Most People  Before Meals  70-130  Two hours after meals  Less than 180  Bedtime  90-150   Why Should You Check Your Blood Glucose? -The A1C tells you how your diabetes is doing over a 3 month period.  Home blood glucose monitoring (or checking) gives you information about your diabetes on a daily basis.  You will learn how well your diabetes care plan is working and whether your blood glucose is in your target range throughout the day.   -Reviewing daily blood glucose levels will help you and your healthcare team make any needed changes to you meal plan, physical activity and medications.  Meter Supplies: - A glucose meter was provided at your visit today along with strips and lancets. The blood glucose meter strips and lancets are usually covered by health insurance. Please let us know if they are not and contact your insurance to see which meter they prefer.  How to get a good blood  sample: -Wash your hands in warm water.  You do not need to use alcohol wipes. -Massage your hands. -Choose which finger you will use.  It helps to use a different finger each time to avoid soreness. -Keep you hand below your wrist when using you lancet to "prick" your finger. -Apply gentle pressure, but do NOT squeeze your finger.  Checking your blood glucose Important Reminders: -Make sure your strips are not expired.  Check the date on the bottle. -Make sure the code on bottle matches the code on your machine. -Make sure your hands are clean and dry. -Do not use the center of your finger, it is the most sensitive area.  Use a spot to the side of the center of your fingertip. -Completely fill the strip target area with blood (until it beeps) to make sure the results are accurate. -You will need a prescription to have your glucose strips and lancets covered by insurance. -There is usually an 800 number on the back of your meter for help with meter issues.  How often to check: -If you take diabetes pills or take one injection of insulin each day, you will usually be asked to check twice a day, before breakfast and 2 hours after one meal, or as directed. -If you take several insulin injections each day, you will usually be asked to check four times a day before meals and at bedtime every day.

## 2016-10-31 LAB — HEMOGLOBIN A1C
EAG (MMOL/L): 7.9 (calc)
HEMOGLOBIN A1C: 6.6 %{Hb} — AB (ref <5.7)
Mean Plasma Glucose: 143 (calc)

## 2016-10-31 LAB — TSH: TSH: 3.11 m[IU]/L (ref 0.40–4.50)

## 2016-10-31 LAB — LIPID PANEL
CHOL/HDL RATIO: 5.9 (calc) — AB (ref <5.0)
CHOLESTEROL: 195 mg/dL (ref <200)
HDL: 33 mg/dL — ABNORMAL LOW (ref >50)
LDL CHOLESTEROL (CALC): 112 mg/dL — AB
Non-HDL Cholesterol (Calc): 162 mg/dL (calc) — ABNORMAL HIGH (ref <130)
Triglycerides: 352 mg/dL — ABNORMAL HIGH (ref <150)

## 2016-10-31 LAB — PROTIME-INR
INR: 0.9
PROTHROMBIN TIME: 9.7 s (ref 9.0–11.5)

## 2016-11-02 LAB — CBC WITH DIFFERENTIAL/PLATELET
BASOS ABS: 53 {cells}/uL (ref 0–200)
Basophils Relative: 0.6 %
EOS ABS: 220 {cells}/uL (ref 15–500)
Eosinophils Relative: 2.5 %
HCT: 40.7 % (ref 35.0–45.0)
HEMOGLOBIN: 14.1 g/dL (ref 11.7–15.5)
Lymphs Abs: 3238 cells/uL (ref 850–3900)
MCH: 30.9 pg (ref 27.0–33.0)
MCHC: 34.6 g/dL (ref 32.0–36.0)
MCV: 89.1 fL (ref 80.0–100.0)
MONOS PCT: 9.1 %
MPV: 10 fL (ref 7.5–12.5)
NEUTROS ABS: 4488 {cells}/uL (ref 1500–7800)
NEUTROS PCT: 51 %
Platelets: 264 10*3/uL (ref 140–400)
RBC: 4.57 10*6/uL (ref 3.80–5.10)
RDW: 12.3 % (ref 11.0–15.0)
Total Lymphocyte: 36.8 %
WBC mixed population: 801 cells/uL (ref 200–950)
WBC: 8.8 10*3/uL (ref 3.8–10.8)

## 2016-11-02 LAB — TEST AUTHORIZATION 2

## 2016-11-02 LAB — BASIC METABOLIC PANEL WITH GFR
BUN: 14 mg/dL (ref 7–25)
CO2: 27 mmol/L (ref 20–32)
CREATININE: 0.84 mg/dL (ref 0.50–0.99)
Calcium: 9.6 mg/dL (ref 8.6–10.4)
Chloride: 106 mmol/L (ref 98–110)
GFR, EST NON AFRICAN AMERICAN: 74 mL/min/{1.73_m2} (ref >=60)
GFR, Est African American: 86 mL/min/{1.73_m2} (ref >=60)
Glucose, Bld: 112 mg/dL — ABNORMAL HIGH (ref 65–99)
Potassium: 4.2 mmol/L (ref 3.5–5.3)
SODIUM: 140 mmol/L (ref 135–146)

## 2016-11-02 LAB — HEPATIC FUNCTION PANEL
AG RATIO: 1.5 (calc) (ref 1.0–2.5)
ALT: 11 U/L (ref 6–29)
AST: 11 U/L (ref 10–35)
Albumin: 4.2 g/dL (ref 3.6–5.1)
Alkaline phosphatase (APISO): 72 U/L (ref 33–130)
BILIRUBIN INDIRECT: 0.2 mg/dL (ref 0.2–1.2)
BILIRUBIN TOTAL: 0.3 mg/dL (ref 0.2–1.2)
Bilirubin, Direct: 0.1 mg/dL (ref 0.0–0.2)
GLOBULIN: 2.8 g/dL (ref 1.9–3.7)
Total Protein: 7 g/dL (ref 6.1–8.1)

## 2016-11-02 LAB — PROTIME-INR

## 2016-11-02 LAB — TEST AUTHORIZATION

## 2016-11-02 LAB — IRON, TOTAL/TOTAL IRON BINDING CAP
%SAT: 20 % (ref 11–50)
Iron: 61 ug/dL (ref 45–160)
TIBC: 304 mcg/dL (calc) (ref 250–450)

## 2016-11-02 LAB — APTT

## 2016-11-19 ENCOUNTER — Other Ambulatory Visit: Payer: Self-pay | Admitting: Physician Assistant

## 2016-11-19 DIAGNOSIS — M25551 Pain in right hip: Secondary | ICD-10-CM

## 2016-12-19 ENCOUNTER — Other Ambulatory Visit: Payer: Self-pay | Admitting: Physician Assistant

## 2016-12-31 ENCOUNTER — Ambulatory Visit: Payer: 59 | Admitting: Physician Assistant

## 2016-12-31 ENCOUNTER — Encounter: Payer: Self-pay | Admitting: Physician Assistant

## 2016-12-31 VITALS — BP 132/76 | HR 65 | Temp 97.5°F | Resp 16 | Ht 67.0 in | Wt 197.8 lb

## 2016-12-31 DIAGNOSIS — D242 Benign neoplasm of left breast: Secondary | ICD-10-CM

## 2016-12-31 MED ORDER — CLOTRIMAZOLE-BETAMETHASONE 1-0.05 % EX CREA
1.0000 | TOPICAL_CREAM | Freq: Two times a day (BID) | CUTANEOUS | 2 refills | Status: DC
Start: 2016-12-31 — End: 2017-10-08

## 2016-12-31 NOTE — Patient Instructions (Signed)

## 2016-12-31 NOTE — Progress Notes (Signed)
   Subjective:    Patient ID: Joyce Shannon, female    DOB: Mar 19, 1955, 61 y.o.   MRN: [id number]  HPI 61 y.o. WF presents with left breast issues x 5 days.  She was showering and noticed left nipple was white and "stood out". No pain, uncomfortable, no discharge. Last MGM was June 2017, CAT B. No fever, chills for her.   Blood pressure 132/76, pulse 65, temperature (!) 97.5 F (36.4 C), resp. rate 16, height 5\' 7"  (1.702 m), weight 197 lb 12.8 oz (89.7 kg), SpO2 95 %.  Medications Current Outpatient Medications on File Prior to Visit  Medication Sig  . amLODipine-benazepril (LOTREL) 10-40 MG capsule TAKE 1 CAPSULE BY MOUTH EVERY DAY  . meloxicam (MOBIC) 15 MG tablet TAKE 1 TABLET BY MOUTH EVERY DAY WITH FOOD FOR 2 WEEKS, THEN AS NEEDED. DO NOT TAKE WITH ALEVE.  Marland Kitchen VITAMIN D, CHOLECALCIFEROL, PO Take by mouth.   Current Facility-Administered Medications on File Prior to Visit  Medication  . 0.9 %  sodium chloride infusion  . 0.9 %  sodium chloride infusion    Problem list She has Essential hypertension; CKD stage 1 due to type 2 diabetes mellitus ([place]); Anxiety; Encounter for long-term (current) use of medications; Hyperlipidemia; Tobacco use; Obesity; and Vitamin D deficiency on their problem list.   Review of Systems  Constitutional: Negative.  Negative for chills and fever.  HENT: Negative.   Respiratory: Negative.   Cardiovascular: Negative.   Gastrointestinal: Negative.  Negative for diarrhea.  Genitourinary: Negative.   Musculoskeletal: Negative.  Negative for arthralgias.  Skin: Positive for rash and wound. Negative for color change.  Neurological: Negative.  Negative for dizziness.       Objective:   Physical Exam  Pulmonary/Chest:  Left medial nipple with thickening and white/scaly area, no warmth, no discharge, non tender. No masses surrounding the nipple/breast. No lymphadenopathy.       Assessment & Plan:   Adenoma of left nipple -     MM DIAG BREAST  TOMO BILATERAL; Future -     US BREAST LTD UNI LEFT INC AXILLA; Future -     clotrimazole-betamethasone (LOTRISONE) cream; Apply 1 application topically 2 (two) times daily. - if not improved may need referral to get biopsy of the area on the nipple to rule out inflammatory CA

## 2017-01-07 ENCOUNTER — Other Ambulatory Visit: Payer: 59

## 2017-01-08 ENCOUNTER — Ambulatory Visit
Admission: RE | Admit: 2017-01-08 | Discharge: 2017-01-08 | Disposition: A | Discharge status: Home / Self Care | Payer: 59 | Source: Ambulatory Visit | Attending: Physician Assistant | Admitting: Physician Assistant

## 2017-01-08 ENCOUNTER — Ambulatory Visit: Payer: 59

## 2017-01-08 DIAGNOSIS — D242 Benign neoplasm of left breast: Secondary | ICD-10-CM

## 2017-01-08 DIAGNOSIS — R928 Other abnormal and inconclusive findings on diagnostic imaging of breast: Secondary | ICD-10-CM | POA: Diagnosis not present

## 2017-01-10 ENCOUNTER — Other Ambulatory Visit: Payer: 59

## 2017-01-24 ENCOUNTER — Encounter: Payer: 59 | Attending: Internal Medicine | Admitting: Dietician

## 2017-01-24 ENCOUNTER — Encounter: Payer: Self-pay | Admitting: Dietician

## 2017-01-24 DIAGNOSIS — Z713 Dietary counseling and surveillance: Secondary | ICD-10-CM | POA: Diagnosis not present

## 2017-01-24 DIAGNOSIS — E119 Type 2 diabetes mellitus without complications: Secondary | ICD-10-CM | POA: Diagnosis not present

## 2017-01-24 NOTE — Progress Notes (Signed)
Patient was seen on 01/24/17 for the first of a series of three diabetes self-management courses at [facility].  Patient Education Plan per assessed needs and concerns is to attend four course education program for Diabetes Self Management Education.  The following learning objectives were met by the patient during this class:  Describe diabetes  State some common risk factors for diabetes  Defines the role of glucose and insulin  Identifies type of diabetes and pathophysiology  Describe the relationship between diabetes and cardiovascular risk  State the members of the Healthcare Team  States the rationale for glucose monitoring  State when to test glucose  State their individual Target Range  State the importance of logging glucose readings  Describe how to interpret glucose readings  Identifies A1C target  Explain the correlation between A1c and eAG values  State symptoms and treatment of high blood glucose  State symptoms and treatment of low blood glucose  Explain proper technique for glucose testing  Identifies proper sharps disposal  Handouts given during class include:  Living Well with Diabetes book  Carb Counting and Meal Planning book  Meal Plan Card  Carbohydrate guide  Meal planning worksheet  Low Sodium Flavoring Tips  The diabetes portion plate  X5O to eAG Conversion Chart  Diabetes Medications  Diabetes Recommended Care Schedule  Support Group  Diabetes Success Plan  Core Class Satisfaction Survey   Follow-Up Plan:  Attend core 2

## 2017-01-31 ENCOUNTER — Ambulatory Visit: Payer: 59

## 2017-02-07 ENCOUNTER — Ambulatory Visit: Payer: 59

## 2017-02-07 ENCOUNTER — Encounter: Payer: 59 | Attending: Internal Medicine | Admitting: Dietician

## 2017-02-07 DIAGNOSIS — Z713 Dietary counseling and surveillance: Secondary | ICD-10-CM | POA: Insufficient documentation

## 2017-02-07 DIAGNOSIS — E119 Type 2 diabetes mellitus without complications: Secondary | ICD-10-CM | POA: Diagnosis not present

## 2017-02-14 NOTE — Progress Notes (Signed)
Patient was seen on 02/07/17 for the third of a series of three diabetes self-management courses at the [facility]. She did not attend the second class and was invited to attend a make-up class in the future.  Joyce Shannon the amount of activity recommended for healthy living . Describe activities suitable for individual needs . Identify ways to regularly incorporate activity into daily life . Identify barriers to activity and ways to over come these barriers  Identify diabetes medications being personally used and their primary action for lowering glucose and possible side effects . Describe role of stress on blood glucose and develop strategies to address psychosocial issues . Identify diabetes complications and ways to prevent them  Explain how to manage diabetes during illness . Evaluate success in meeting personal goal . Establish 2-3 goals that they will plan to diligently work on until they return for the  41-month follow-up visit  Goals:   I will be active 45 minutes or more 3 times a week  I will test my glucose as prescribed  I will attend Core 2 class on Food and Nutrition  Your patient has identified these potential barriers to change:  Motivation  Your patient has identified their diabetes self-care support plan as: On-line Resources    Plan:  Attend Support Group as desired

## 2017-03-04 DIAGNOSIS — H2513 Age-related nuclear cataract, bilateral: Secondary | ICD-10-CM | POA: Diagnosis not present

## 2017-03-14 ENCOUNTER — Other Ambulatory Visit: Payer: Self-pay | Admitting: Physician Assistant

## 2017-03-20 ENCOUNTER — Encounter: Payer: Self-pay | Admitting: Physician Assistant

## 2017-04-02 ENCOUNTER — Telehealth: Payer: Self-pay | Admitting: Physician Assistant

## 2017-04-02 MED ORDER — SERTRALINE HCL 50 MG PO TABS: 50.0000 mg | ORAL_TABLET | Freq: Every day | ORAL | 2 refills | Status: DC

## 2017-04-02 MED ORDER — ALPRAZOLAM 0.5 MG PO TABS: 0.5000 mg | ORAL_TABLET | Freq: Every day | ORAL | 0 refills | Status: DC | PRN

## 2017-04-02 NOTE — Telephone Encounter (Signed)
Husband is getting worse health wise, asking for something to "take the egde off" will do zoloft 50mg  daily can start on 1/2 for 1 week and increase to 1 pill.   Will send in fifteen xanax 0.5mg  short term to take once a daily as needed.   If need more or want to discuss can schedule OV

## 2017-04-02 NOTE — Telephone Encounter (Signed)
Patient is aware of the recommendations 

## 2017-04-15 DIAGNOSIS — H9311 Tinnitus, right ear: Secondary | ICD-10-CM | POA: Diagnosis not present

## 2017-06-10 ENCOUNTER — Other Ambulatory Visit: Payer: Self-pay | Admitting: Adult Health

## 2017-06-11 ENCOUNTER — Encounter (INDEPENDENT_AMBULATORY_CARE_PROVIDER_SITE_OTHER): Payer: Self-pay

## 2017-07-22 ENCOUNTER — Encounter (INDEPENDENT_AMBULATORY_CARE_PROVIDER_SITE_OTHER): Payer: Self-pay

## 2017-08-01 ENCOUNTER — Encounter (INDEPENDENT_AMBULATORY_CARE_PROVIDER_SITE_OTHER): Payer: Self-pay

## 2017-08-15 ENCOUNTER — Encounter (INDEPENDENT_AMBULATORY_CARE_PROVIDER_SITE_OTHER): Payer: Self-pay

## 2017-08-30 ENCOUNTER — Other Ambulatory Visit: Payer: Self-pay | Admitting: Physician Assistant

## 2017-09-20 DIAGNOSIS — Z719 Counseling, unspecified: Secondary | ICD-10-CM | POA: Diagnosis not present

## 2017-10-01 ENCOUNTER — Other Ambulatory Visit: Payer: Self-pay | Admitting: Adult Health

## 2017-10-03 ENCOUNTER — Other Ambulatory Visit: Payer: Self-pay | Admitting: Physician Assistant

## 2017-10-04 DIAGNOSIS — E119 Type 2 diabetes mellitus without complications: Secondary | ICD-10-CM | POA: Insufficient documentation

## 2017-10-04 NOTE — Progress Notes (Signed)
FOLLOW UP  Assessment and Plan:   Hypertension Well controlled with current medications  Monitor blood pressure at home; patient to call if consistently greater than 130/80 Continue DASH diet.   Reminder to go to the ER if any CP, SOB, nausea, dizziness, severe HA, changes vision/speech, left arm numbness and tingling and jaw pain.  Cholesterol Currently above goal; not currently on treatment; consider statin therapy , discussed goal <70 Continue low cholesterol diet and exercise.  Check lipid panel.   Diabetes with diabetic chronic kidney disease Newly diabetic at last check; not yet on medications; discussed metformin pending labs Continue diet and exercise.  Perform daily foot/skin check, notify office of any concerning changes.  Check A1C  Obesity with co morbidities Long discussion about weight loss, diet, and exercise Recommended diet heavy in fruits and veggies and low in animal meats, cheeses, and dairy products, appropriate calorie intake Discussed ideal weight for height  Patient will work on increasing exercise, moderate portions Will follow up in 3 months  Vitamin D Def Continue supplementation Defer vitamin D level to CPE  Anxiety Well managed off of medications Stress management techniques discussed, increase water, good sleep hygiene discussed, increase exercise, and increase veggies.   Tobacco use Discussed risks associated with tobacco use and advised to reduce or quit Patient is ready to do so, wants to try wellbutrin after discussion Will follow up at the next visit   Continue diet and meds as discussed. Further disposition pending results of labs. Discussed med's effects and SE's.   Over 30 minutes of exam, counseling, chart review, and critical decision making was performed.   No future appointments.  ----------------------------------------------------------------------------------------------------------------------  HPI 62 y.o. female   presents for 3 month follow up on hypertension, cholesterol, diabetes, obesity, anxiety and vitamin D deficiency.   she has a diagnosis of anxiety and was on zoloft 50 mg daily and is prescribed xanax 0.5 mg PRN, but reports her mood has been much better and is off of medications and doing well.   she currently continues to smoke 1/3 pack a day, down from previous; discussed risks associated with smoking, patient is ready to quit.   BMI is Body mass index is 31.7 kg/m., she has been working on diet, exercise is limited due to husband's health at this time.  Wt Readings from Last 3 Encounters:  10/08/17 202 lb 6.4 oz (91.8 kg)  12/31/16 197 lb 12.8 oz (89.7 kg)  10/30/16 192 lb 12.8 oz (87.5 kg)   Her blood pressure has been controlled at home, today their BP is BP: 126/80  She does not workout. She denies chest pain, shortness of breath, dizziness.   She is not on cholesterol medication and denies myalgias. Her cholesterol is not at goal. The cholesterol last visit was:   Lab Results  Component Value Date   CHOL 195 10/30/2016   HDL 33 (L) 10/30/2016   LDLCALC 112 (H) 10/30/2016   TRIG 352 (H) 10/30/2016   CHOLHDL 5.9 (H) 10/30/2016    She has not been working on diet and exercise for T2DM, and denies foot ulcerations, increased appetite, nausea, paresthesia of the feet, polydipsia, polyuria, visual disturbances, vomiting and weight loss. Last A1C in the office was:  Lab Results  Component Value Date   HGBA1C 6.6 (H) 10/30/2016   Patient is on Vitamin D supplement.   Lab Results  Component Value Date   VD25OH 38 03/19/2016        Current Medications:  Current Outpatient  Medications on File Prior to Visit  Medication Sig  . amLODipine-benazepril (LOTREL) 10-40 MG capsule TAKE 1 CAPSULE BY MOUTH EVERY DAY  . VITAMIN D, CHOLECALCIFEROL, PO Take 500 mcg by mouth.   . ALPRAZolam (XANAX) 0.5 MG tablet Take 1 tablet (0.5 mg total) by mouth daily as needed for anxiety or sleep.  .  clotrimazole-betamethasone (LOTRISONE) cream Apply 1 application topically 2 (two) times daily.  . meloxicam (MOBIC) 15 MG tablet TAKE 1 TABLET BY MOUTH EVERY DAY WITH FOOD FOR 2 WEEKS, THEN AS NEEDED. DO NOT TAKE WITH ALEVE.  Marland Kitchen sertraline (ZOLOFT) 50 MG tablet Take 1 tablet (50 mg total) by mouth daily.   Current Facility-Administered Medications on File Prior to Visit  Medication  . 0.9 %  sodium chloride infusion  . 0.9 %  sodium chloride infusion     Allergies:  Allergies  Allergen Reactions  . Other     Tylenol #3  . Tylox [Oxycodone-Acetaminophen]      Medical History:  Past Medical History:  Diagnosis Date  . Anemia   . Asthma   . Depression   . Diabetes mellitus without complication ([place])   . Hyperlipidemia   . Hypertension   . Right bundle branch block 05/2005  . Vitamin D deficiency    Family history- Reviewed and unchanged Social history- Reviewed and unchanged   Review of Systems:  Review of Systems  Constitutional: Negative for malaise/fatigue and weight loss.  HENT: Negative for hearing loss and tinnitus.   Eyes: Negative for blurred vision and double vision.  Respiratory: Negative for cough, shortness of breath and wheezing.   Cardiovascular: Negative for chest pain, palpitations, orthopnea, claudication and leg swelling.  Gastrointestinal: Negative for abdominal pain, blood in stool, constipation, diarrhea, heartburn, melena, nausea and vomiting.  Genitourinary: Negative.   Musculoskeletal: Negative for joint pain and myalgias.  Skin: Negative for rash.  Neurological: Negative for dizziness, tingling, sensory change, weakness and headaches.  Endo/Heme/Allergies: Negative for polydipsia.  Psychiatric/Behavioral: Negative.   All other systems reviewed and are negative.     Physical Exam: BP 126/80   Pulse 74   Temp (!) 97.3 F (36.3 C)   Ht 5\' 7"  (1.702 m)   Wt 202 lb 6.4 oz (91.8 kg)   SpO2 96%   BMI 31.70 kg/m  Wt Readings from Last 3  Encounters:  10/08/17 202 lb 6.4 oz (91.8 kg)  12/31/16 197 lb 12.8 oz (89.7 kg)  10/30/16 192 lb 12.8 oz (87.5 kg)   General Appearance: Well nourished, in no apparent distress. Eyes: PERRLA, EOMs, conjunctiva no swelling or erythema Sinuses: No Frontal/maxillary tenderness ENT/Mouth: Ext aud canals clear, TMs without erythema, bulging. No erythema, swelling, or exudate on post pharynx.  Tonsils not swollen or erythematous. Hearing normal.  Neck: Supple, thyroid normal.  Respiratory: Respiratory effort normal, BS equal bilaterally without rales, rhonchi, wheezing or stridor.  Cardio: RRR with no MRGs. Brisk peripheral pulses without edema.  Abdomen: Soft, + BS.  Non tender, no guarding, rebound, hernias, masses. Lymphatics: Non tender without lymphadenopathy.  Musculoskeletal: Full ROM, 5/5 strength, Normal gait Skin: Warm, dry without rashes, lesions, ecchymosis.  Neuro: Cranial nerves intact. No cerebellar symptoms.  Psych: Awake and oriented X 3, normal affect, Insight and Judgment appropriate.    Joyce Ribas, NP 4:33 PM [facility]

## 2017-10-08 ENCOUNTER — Encounter: Payer: Self-pay | Admitting: Adult Health

## 2017-10-08 ENCOUNTER — Ambulatory Visit (INDEPENDENT_AMBULATORY_CARE_PROVIDER_SITE_OTHER): Payer: 59 | Admitting: Adult Health

## 2017-10-08 VITALS — BP 126/80 | HR 74 | Temp 97.3°F | Ht 67.0 in | Wt 202.4 lb

## 2017-10-08 DIAGNOSIS — E1169 Type 2 diabetes mellitus with other specified complication: Secondary | ICD-10-CM

## 2017-10-08 DIAGNOSIS — Z79899 Other long term (current) drug therapy: Secondary | ICD-10-CM

## 2017-10-08 DIAGNOSIS — E785 Hyperlipidemia, unspecified: Secondary | ICD-10-CM

## 2017-10-08 DIAGNOSIS — E559 Vitamin D deficiency, unspecified: Secondary | ICD-10-CM

## 2017-10-08 DIAGNOSIS — I10 Essential (primary) hypertension: Secondary | ICD-10-CM | POA: Diagnosis not present

## 2017-10-08 DIAGNOSIS — N181 Chronic kidney disease, stage 1: Secondary | ICD-10-CM

## 2017-10-08 DIAGNOSIS — E669 Obesity, unspecified: Secondary | ICD-10-CM

## 2017-10-08 DIAGNOSIS — Z720 Tobacco use: Secondary | ICD-10-CM | POA: Diagnosis not present

## 2017-10-08 DIAGNOSIS — F419 Anxiety disorder, unspecified: Secondary | ICD-10-CM

## 2017-10-08 DIAGNOSIS — E1122 Type 2 diabetes mellitus with diabetic chronic kidney disease: Secondary | ICD-10-CM

## 2017-10-08 MED ORDER — BUPROPION HCL ER (XL) 150 MG PO TB24: 150.0000 mg | ORAL_TABLET | Freq: Every day | ORAL | 2 refills | Status: DC

## 2017-10-08 NOTE — Patient Instructions (Signed)
Goals    . Blood Pressure < 130/80    . Exercise 3x per week (30 min per time)    . LDL CALC < 70    . Quit Smoking    . Weight (lb) < 200 lb (90.7 kg)       Steps to Quit Smoking Smoking tobacco can be harmful to your health and can affect almost every organ in your body. Smoking puts you, and those around you, at risk for developing many serious chronic diseases. Quitting smoking is difficult, but it is one of the best things that you can do for your health. It is never too late to quit. What are the benefits of quitting smoking? When you quit smoking, you lower your risk of developing serious diseases and conditions, such as:  Lung cancer or lung disease, such as COPD.  Heart disease.  Stroke.  Heart attack.  Infertility.  Osteoporosis and bone fractures.  Additionally, symptoms such as coughing, wheezing, and shortness of breath may get better when you quit. You may also find that you get sick less often because your body is stronger at fighting off colds and infections. If you are pregnant, quitting smoking can help to reduce your chances of having a baby of low birth weight. How do I get ready to quit? When you decide to quit smoking, create a plan to make sure that you are successful. Before you quit:  Pick a date to quit. Set a date within the next two weeks to give you time to prepare.  Write down the reasons why you are quitting. Keep this list in places where you will see it often, such as on your bathroom mirror or in your car or wallet.  Identify the people, places, things, and activities that make you want to smoke (triggers) and avoid them. Make sure to take these actions: ? Throw away all cigarettes at home, at work, and in your car. ? Throw away smoking accessories, such as Scientist, research (medical). ? Clean your car and make sure to empty the ashtray. ? Clean your home, including curtains and carpets.  Tell your family, friends, and coworkers that you are  quitting. Support from your loved ones can make quitting easier.  Talk with your health care provider about your options for quitting smoking.  Find out what treatment options are covered by your health insurance.  What strategies can I use to quit smoking? Talk with your healthcare provider about different strategies to quit smoking. Some strategies include:  Quitting smoking altogether instead of gradually lessening how much you smoke over a period of time. Research shows that quitting "cold [place]" is more successful than gradually quitting.  Attending in-person counseling to help you build problem-solving skills. You are more likely to have success in quitting if you attend several counseling sessions. Even short sessions of 10 minutes can be effective.  Finding resources and support systems that can help you to quit smoking and remain smoke-free after you quit. These resources are most helpful when you use them often. They can include: ? Online chats with a Social worker. ? Telephone quitlines. ? Careers information officer. ? Support groups or group counseling. ? Text messaging programs. ? Mobile phone applications.  Taking medicines to help you quit smoking. (If you are pregnant or breastfeeding, talk with your health care provider first.) Some medicines contain nicotine and some do not. Both types of medicines help with cravings, but the medicines that include nicotine help to relieve withdrawal  symptoms. Your health care provider may recommend: ? Nicotine patches, gum, or lozenges. ? Nicotine inhalers or sprays. ? Non-nicotine medicine that is taken by mouth.  Talk with your health care provider about combining strategies, such as taking medicines while you are also receiving in-person counseling. Using these two strategies together makes you more likely to succeed in quitting than if you used either strategy on its own. If you are pregnant or breastfeeding, talk with your health care  provider about finding counseling or other support strategies to quit smoking. Do not take medicine to help you quit smoking unless told to do so by your health care provider. What things can I do to make it easier to quit? Quitting smoking might feel overwhelming at first, but there is a lot that you can do to make it easier. Take these important actions:  Reach out to your family and friends and ask that they support and encourage you during this time. Call telephone quitlines, reach out to support groups, or work with a counselor for support.  Ask people who smoke to avoid smoking around you.  Avoid places that trigger you to smoke, such as bars, parties, or smoke-break areas at work.  Spend time around people who do not smoke.  Lessen stress in your life, because stress can be a smoking trigger for some people. To lessen stress, try: ? Exercising regularly. ? Deep-breathing exercises. ? Yoga. ? Meditating. ? Performing a body scan. This involves closing your eyes, scanning your body from head to toe, and noticing which parts of your body are particularly tense. Purposefully relax the muscles in those areas.  Download or purchase mobile phone or tablet apps (applications) that can help you stick to your quit plan by providing reminders, tips, and encouragement. There are many free apps, such as QuitGuide from the [employer] Office manager for Disease Control and Prevention). You can find other support for quitting smoking (smoking cessation) through smokefree.gov and other websites.  How will I feel when I quit smoking? Within the first 24 hours of quitting smoking, you may start to feel some withdrawal symptoms. These symptoms are usually most noticeable 2-3 days after quitting, but they usually do not last beyond 2-3 weeks. Changes or symptoms that you might experience include:  Mood swings.  Restlessness, anxiety, or irritation.  Difficulty concentrating.  Dizziness.  Strong cravings for  sugary foods in addition to nicotine.  Mild weight gain.  Constipation.  Nausea.  Coughing or a sore throat.  Changes in how your medicines work in your body.  A depressed mood.  Difficulty sleeping (insomnia).  After the first 2-3 weeks of quitting, you may start to notice more positive results, such as:  Improved sense of smell and taste.  Decreased coughing and sore throat.  Slower heart rate.  Lower blood pressure.  Clearer skin.  The ability to breathe more easily.  Fewer sick days.  Quitting smoking is very challenging for most people. Do not get discouraged if you are not successful the first time. Some people need to make many attempts to quit before they achieve long-term success. Do your best to stick to your quit plan, and talk with your health care provider if you have any questions or concerns. This information is not intended to replace advice given to you by your health care provider. Make sure you discuss any questions you have with your health care provider. Document Released: 01/16/2001 Document Revised: 09/20/2015 Document Reviewed: 06/08/2014 Elsevier Interactive Patient Education  2018  [place].     Bupropion sustained-release tablets (smoking cessation) What is this medicine? BUPROPION (byoo PROE pee on) is used to help people quit smoking. This medicine may be used for other purposes; ask your health care provider or pharmacist if you have questions. COMMON BRAND NAME(S): Buproban, Zyban What should I tell my health care provider before I take this medicine? They need to know if you have any of these conditions: -an eating disorder, such as anorexia or bulimia -bipolar disorder or psychosis -diabetes or high blood sugar, treated with medication -glaucoma -head injury or brain tumor -heart disease, previous heart attack, or irregular heart beat -high blood pressure -kidney or liver disease -seizures -suicidal thoughts or a previous  suicide attempt -Tourette's syndrome -weight loss -an unusual or allergic reaction to bupropion, other medicines, foods, dyes, or preservatives -breast-feeding -pregnant or trying to become pregnant How should I use this medicine? Take this medicine by mouth with a glass of water. Follow the directions on the prescription label. You can take it with or without food. If it upsets your stomach, take it with food. Do not cut, crush or chew this medicine. Take your medicine at regular intervals. If you take this medicine more than once a day, take your second dose at least 8 hours after you take your first dose. To limit difficulty in sleeping, avoid taking this medicine at bedtime. Do not take your medicine more often than directed. Do not stop taking this medicine suddenly except upon the advice of your doctor. Stopping this medicine too quickly may cause serious side effects. A special MedGuide will be given to you by the pharmacist with each prescription and refill. Be sure to read this information carefully each time. Talk to your pediatrician regarding the use of this medicine in children. Special care may be needed. Overdosage: If you think you have taken too much of this medicine contact a poison control center or emergency room at once. NOTE: This medicine is only for you. Do not share this medicine with others. What if I miss a dose? If you miss a dose, skip the missed dose and take your next tablet at the regular time. There should be at least 8 hours between doses. Do not take double or extra doses. What may interact with this medicine? Do not take this medicine with any of the following medications: -linezolid -MAOIs like Azilect, Carbex, Eldepryl, Marplan, Nardil, and Parnate -methylene blue (injected into a vein) -other medicines that contain bupropion like Wellbutrin This medicine may also interact with the following medications: -alcohol -certain medicines for anxiety or  sleep -certain medicines for blood pressure like metoprolol, propranolol -certain medicines for depression or psychotic disturbances -certain medicines for HIV or AIDS like efavirenz, lopinavir, nelfinavir, ritonavir -certain medicines for irregular heart beat like propafenone, flecainide -certain medicines for Parkinson's disease like amantadine, levodopa -certain medicines for seizures like carbamazepine, phenytoin, phenobarbital -cimetidine -clopidogrel -cyclophosphamide -digoxin -furazolidone -isoniazid -nicotine -orphenadrine -procarbazine -steroid medicines like prednisone or cortisone -stimulant medicines for attention disorders, weight loss, or to stay awake -tamoxifen -theophylline -thiotepa -ticlopidine -tramadol -warfarin This list may not describe all possible interactions. Give your health care provider a list of all the medicines, herbs, non-prescription drugs, or dietary supplements you use. Also tell them if you smoke, drink alcohol, or use illegal drugs. Some items may interact with your medicine. What should I watch for while using this medicine? Visit your doctor or health care professional for regular checks on your progress. This medicine  should be used together with a patient support program. It is important to participate in a behavioral program, counseling, or other support program that is recommended by your health care professional. Patients and their families should watch out for new or worsening thoughts of suicide or depression. Also watch out for sudden changes in feelings such as feeling anxious, agitated, panicky, irritable, hostile, aggressive, impulsive, severely restless, overly excited and hyperactive, or not being able to sleep. If this happens, especially at the beginning of treatment or after a change in dose, call your health care professional. Avoid alcoholic drinks while taking this medicine. Drinking excessive alcoholic beverages, using sleeping  or anxiety medicines, or quickly stopping the use of these agents while taking this medicine may increase your risk for a seizure. Do not drive or use heavy machinery until you know how this medicine affects you. This medicine can impair your ability to perform these tasks. Do not take this medicine close to bedtime. It may prevent you from sleeping. Your mouth may get dry. Chewing sugarless gum or sucking hard candy, and drinking plenty of water may help. Contact your doctor if the problem does not go away or is severe. Do not use nicotine patches or chewing gum without the advice of your doctor or health care professional while taking this medicine. You may need to have your blood pressure taken regularly if your doctor recommends that you use both nicotine and this medicine together. What side effects may I notice from receiving this medicine? Side effects that you should report to your doctor or health care professional as soon as possible: -allergic reactions like skin rash, itching or hives, swelling of the face, lips, or tongue -breathing problems -changes in vision -confusion -elevated mood, decreased need for sleep, racing thoughts, impulsive behavior -fast or irregular heartbeat -hallucinations, loss of contact with reality -increased blood pressure -redness, blistering, peeling or loosening of the skin, including inside the mouth -seizures -suicidal thoughts or other mood changes -unusually weak or tired -vomiting Side effects that usually do not require medical attention (report to your doctor or health care professional if they continue or are bothersome): -constipation -headache -loss of appetite -nausea -tremors -weight loss This list may not describe all possible side effects. Call your doctor for medical advice about side effects. You may report side effects to FDA at 1-800-FDA-1088. Where should I keep my medicine? Keep out of the reach of children. Store at room  temperature between 20 and 25 degrees C (68 and 77 degrees F). Protect from light. Keep container tightly closed. Throw away any unused medicine after the expiration date. NOTE: This sheet is a summary. It may not cover all possible information. If you have questions about this medicine, talk to your doctor, pharmacist, or health care provider.  2018 Elsevier/Gold Standard (2015-07-15 13:49:28)

## 2017-10-09 ENCOUNTER — Other Ambulatory Visit: Payer: Self-pay | Admitting: Adult Health

## 2017-10-09 DIAGNOSIS — N183 Chronic kidney disease, stage 3 unspecified: Secondary | ICD-10-CM

## 2017-10-09 LAB — CBC WITH DIFFERENTIAL/PLATELET
Basophils Absolute: 53 cells/uL (ref 0–200)
Basophils Relative: 0.6 %
EOS ABS: 176 {cells}/uL (ref 15–500)
Eosinophils Relative: 2 %
HEMATOCRIT: 40.2 % (ref 35.0–45.0)
HEMOGLOBIN: 13.6 g/dL (ref 11.7–15.5)
LYMPHS ABS: 3353 {cells}/uL (ref 850–3900)
MCH: 29.8 pg (ref 27.0–33.0)
MCHC: 33.8 g/dL (ref 32.0–36.0)
MCV: 88.2 fL (ref 80.0–100.0)
MPV: 9.7 fL (ref 7.5–12.5)
Monocytes Relative: 9.3 %
NEUTROS ABS: 4400 {cells}/uL (ref 1500–7800)
Neutrophils Relative %: 50 %
Platelets: 255 10*3/uL (ref 140–400)
RBC: 4.56 10*6/uL (ref 3.80–5.10)
RDW: 12.5 % (ref 11.0–15.0)
TOTAL LYMPHOCYTE: 38.1 %
WBC: 8.8 10*3/uL (ref 3.8–10.8)
WBCMIX: 818 {cells}/uL (ref 200–950)

## 2017-10-09 LAB — COMPLETE METABOLIC PANEL WITH GFR
AG RATIO: 1.4 (calc) (ref 1.0–2.5)
ALBUMIN MSPROF: 4.1 g/dL (ref 3.6–5.1)
ALT: 10 U/L (ref 6–29)
AST: 11 U/L (ref 10–35)
Alkaline phosphatase (APISO): 72 U/L (ref 33–130)
BILIRUBIN TOTAL: 0.4 mg/dL (ref 0.2–1.2)
BUN / CREAT RATIO: 15 (calc) (ref 6–22)
BUN: 18 mg/dL (ref 7–25)
CO2: 31 mmol/L (ref 20–32)
Calcium: 10.2 mg/dL (ref 8.6–10.4)
Chloride: 108 mmol/L (ref 98–110)
Creat: 1.21 mg/dL — ABNORMAL HIGH (ref 0.50–0.99)
GFR, EST AFRICAN AMERICAN: 55 mL/min/{1.73_m2} — AB (ref >=60)
GFR, EST NON AFRICAN AMERICAN: 48 mL/min/{1.73_m2} — AB (ref >=60)
GLOBULIN: 3 g/dL (ref 1.9–3.7)
Glucose, Bld: 98 mg/dL (ref 65–99)
POTASSIUM: 5 mmol/L (ref 3.5–5.3)
Sodium: 145 mmol/L (ref 135–146)
TOTAL PROTEIN: 7.1 g/dL (ref 6.1–8.1)

## 2017-10-09 LAB — HEMOGLOBIN A1C
EAG (MMOL/L): 8.9 (calc)
Hgb A1c MFr Bld: 7.2 % of total Hgb — ABNORMAL HIGH (ref <5.7)
Mean Plasma Glucose: 160 (calc)

## 2017-10-09 LAB — LIPID PANEL
Cholesterol: 194 mg/dL (ref <200)
HDL: 39 mg/dL — ABNORMAL LOW (ref >50)
LDL Cholesterol (Calc): 118 mg/dL (calc) — ABNORMAL HIGH
NON-HDL CHOLESTEROL (CALC): 155 mg/dL — AB (ref <130)
Total CHOL/HDL Ratio: 5 (calc) — ABNORMAL HIGH (ref <5.0)
Triglycerides: 261 mg/dL — ABNORMAL HIGH (ref <150)

## 2017-10-09 LAB — TSH: TSH: 2.43 m[IU]/L (ref 0.40–4.50)

## 2017-10-09 MED ORDER — METFORMIN HCL ER 500 MG PO TB24: ORAL_TABLET | ORAL | 1 refills | Status: DC

## 2017-10-28 ENCOUNTER — Other Ambulatory Visit: Payer: Self-pay | Admitting: Adult Health

## 2018-01-10 NOTE — Progress Notes (Signed)
FOLLOW UP  Assessment and Plan:   Hypertension Well controlled with current medications  Monitor blood pressure at home; patient to call if consistently greater than 130/80 Continue DASH diet.   Reminder to go to the ER if any CP, SOB, nausea, dizziness, severe HA, changes vision/speech, left arm numbness and tingling and jaw pain.  Cholesterol Currently above goal; not currently on treatment; discussed goal <70, initiating rosuvastatin 20 mg three days a week after discussed guidelines with patient today Continue low cholesterol diet and exercise.  Check lipid panel.   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  Perform daily foot/skin check, notify office of any concerning changes.  Check A1C  Obesity with co morbidities Long discussion about weight loss, diet, and exercise Recommended diet heavy in fruits and veggies and low in animal meats, cheeses, and dairy products, appropriate calorie intake Discussed ideal weight for height  Patient will work on increasing exercise, moderate portions Will follow up in 3 months  Vitamin D Def Restart supplementation Defer vitamin D level to next visit, CPE  Anxiety Well managed off of medications Stress management techniques discussed, increase water, good sleep hygiene discussed, increase exercise, and increase veggies.   Tobacco use Discussed risks associated with tobacco use and advised to reduce or quit Patient is ready to do so, doing well tapering down on wellbutrin, down to 2 cigs a day Will follow up at the next visit  Continue diet and meds as discussed. Further disposition pending results of labs. Discussed med's effects and SE's.   Over 30 minutes of exam, counseling, chart review, and critical decision making was performed.   No future  appointments.  ----------------------------------------------------------------------------------------------------------------------  HPI 62 y.o. female  presents for 3 month follow up on hypertension, cholesterol, diabetes, obesity, anxiety and vitamin D deficiency.   she has a diagnosis of anxiety and was on zoloft 50 mg daily and is prescribed xanax 0.5 mg PRN, we started wellbutrin at last vist, 150 mg daily and doing well with this, feels she no longer needs the xanax, reports this medication has been excellent fit and she feels much more in control. She is sleeping well at night, denies SE.   she currently continues to smoke 1/10 pack a day, down from previous 1 pack a day; discussed risks associated with smoking, patient is ready to quit, actively working on cutting down. Wellbutrin has been very helpful.   BMI is Body mass index is 31.17 kg/m., she has been working on diet, exercise is limited due to husband's health at this time, had hip replacement, heart valve, stage 4 kidneys. She has been trying to walk more, taking steps instead of elevated. She reports water intake is excellent, sleeping well most nights.  Wt Readings from Last 3 Encounters:  01/13/18 199 lb (90.3 kg)  10/08/17 202 lb 6.4 oz (91.8 kg)  12/31/16 197 lb 12.8 oz (89.7 kg)   Her blood pressure has been controlled at home, today their BP is BP: 118/70  She does not workout. She denies chest pain, shortness of breath, dizziness.   She is not on cholesterol medication and denies myalgias. Her cholesterol is not at goal. The cholesterol last visit was:   Lab Results  Component Value Date   CHOL 194 10/08/2017   HDL 39 (L) 10/08/2017   LDLCALC 118 (H) 10/08/2017   TRIG 261 (H)  10/08/2017   CHOLHDL 5.0 (H) 10/08/2017    She has not been working on diet and exercise for T2DM newly on metformin 500 mg daily, and denies foot ulcerations, increased appetite, nausea, paresthesia of the feet, polydipsia, polyuria,  visual disturbances, vomiting and weight loss. Last A1C in the office was:  Lab Results  Component Value Date   HGBA1C 7.2 (H) 10/08/2017   Patient is on Vitamin D supplement., stopped taking about a year.     Lab Results  Component Value Date   VD25OH 38 03/19/2016       Current Medications:  Current Outpatient Medications on File Prior to Visit  Medication Sig  . amLODipine-benazepril (LOTREL) 10-40 MG capsule TAKE 1 CAPSULE BY MOUTH EVERY DAY  . aspirin EC 81 MG tablet Take 81 mg by mouth daily.  Marland Kitchen buPROPion (WELLBUTRIN XL) 150 MG 24 hr tablet Take 1 tablet (150 mg total) by mouth daily.  . metFORMIN (GLUCOPHAGE-XR) 500 MG 24 hr tablet Take 1 tab daily with largest meal of the day.  Marland Kitchen VITAMIN D, CHOLECALCIFEROL, PO Take 500 mcg by mouth.    No current facility-administered medications on file prior to visit.      Allergies:  Allergies  Allergen Reactions  . Other     Tylenol #3  . Tylox [Oxycodone-Acetaminophen]      Medical History:  Past Medical History:  Diagnosis Date  . Anemia   . Asthma   . Depression   . Diabetes mellitus without complication ([place])   . Hyperlipidemia   . Hypertension   . Right bundle branch block 05/2005  . Vitamin D deficiency    Family history- Reviewed and unchanged Social history- Reviewed and unchanged   Review of Systems:  Review of Systems  Constitutional: Negative for malaise/fatigue and weight loss.  HENT: Negative for hearing loss and tinnitus.   Eyes: Negative for blurred vision and double vision.  Respiratory: Negative for cough, shortness of breath and wheezing.   Cardiovascular: Negative for chest pain, palpitations, orthopnea, claudication and leg swelling.  Gastrointestinal: Negative for abdominal pain, blood in stool, constipation, diarrhea, heartburn, melena, nausea and vomiting.  Genitourinary: Negative.   Musculoskeletal: Negative for joint pain and myalgias.  Skin: Negative for rash.  Neurological: Negative for  dizziness, tingling, sensory change, weakness and headaches.  Endo/Heme/Allergies: Negative for polydipsia.  Psychiatric/Behavioral: Negative.   All other systems reviewed and are negative.   Physical Exam: BP 118/70   Pulse 77   Temp (!) 97.3 F (36.3 C)   Ht 5\' 7"  (1.702 m)   Wt 199 lb (90.3 kg)   SpO2 97%   BMI 31.17 kg/m  Wt Readings from Last 3 Encounters:  01/13/18 199 lb (90.3 kg)  10/08/17 202 lb 6.4 oz (91.8 kg)  12/31/16 197 lb 12.8 oz (89.7 kg)   General Appearance: Well nourished, in no apparent distress. Eyes: PERRLA, EOMs, conjunctiva no swelling or erythema Sinuses: No Frontal/maxillary tenderness ENT/Mouth: Ext aud canals clear, TMs without erythema, bulging. No erythema, swelling, or exudate on post pharynx.  Tonsils not swollen or erythematous. Hearing normal.  Neck: Supple, thyroid normal.  Respiratory: Respiratory effort normal, BS equal bilaterally without rales, rhonchi, wheezing or stridor.  Cardio: RRR with no MRGs. Brisk peripheral pulses without edema.  Abdomen: Soft, + BS.  Non tender, no guarding, rebound, hernias, masses. Lymphatics: Non tender without lymphadenopathy.  Musculoskeletal: Full ROM, 5/5 strength, Normal gait Skin: Warm, dry without rashes, lesions, ecchymosis.  Neuro: Cranial nerves intact. No cerebellar  symptoms.  Psych: Awake and oriented X 3, normal affect, Insight and Judgment appropriate.    Joyce Ribas, NP 4:53 PM [facility]

## 2018-01-13 ENCOUNTER — Ambulatory Visit (INDEPENDENT_AMBULATORY_CARE_PROVIDER_SITE_OTHER): Payer: 59 | Admitting: Adult Health

## 2018-01-13 ENCOUNTER — Encounter: Payer: Self-pay | Admitting: Adult Health

## 2018-01-13 VITALS — BP 118/70 | HR 77 | Temp 97.3°F | Ht 67.0 in | Wt 199.0 lb

## 2018-01-13 DIAGNOSIS — E559 Vitamin D deficiency, unspecified: Secondary | ICD-10-CM

## 2018-01-13 DIAGNOSIS — I10 Essential (primary) hypertension: Secondary | ICD-10-CM

## 2018-01-13 DIAGNOSIS — E1122 Type 2 diabetes mellitus with diabetic chronic kidney disease: Secondary | ICD-10-CM | POA: Diagnosis not present

## 2018-01-13 DIAGNOSIS — E1169 Type 2 diabetes mellitus with other specified complication: Secondary | ICD-10-CM | POA: Diagnosis not present

## 2018-01-13 DIAGNOSIS — N181 Chronic kidney disease, stage 1: Secondary | ICD-10-CM

## 2018-01-13 DIAGNOSIS — E669 Obesity, unspecified: Secondary | ICD-10-CM

## 2018-01-13 DIAGNOSIS — Z720 Tobacco use: Secondary | ICD-10-CM

## 2018-01-13 DIAGNOSIS — F419 Anxiety disorder, unspecified: Secondary | ICD-10-CM

## 2018-01-13 DIAGNOSIS — E785 Hyperlipidemia, unspecified: Secondary | ICD-10-CM

## 2018-01-13 MED ORDER — ROSUVASTATIN CALCIUM 20 MG PO TABS: ORAL_TABLET | ORAL | 1 refills | Status: DC

## 2018-01-13 NOTE — Patient Instructions (Addendum)
Goals    . Blood Pressure < 130/80    . Exercise 150 min per week of moderate intesity acitivty    . HEMOGLOBIN A1C < 7.0    . LDL CALC < 70    . Quit Smoking    . Weight (lb) < 185 lb (83.9 kg)       We are adding a statin cholesterol medication called rosuvastatin (20 mg three days a week) for LDL goal of <70; this medication is most effective if taken in the evening. Please call me if you have any unusual muscle aches, fatigue, difficulty focusing/mental fog while taking this medication.  Please have your vision provider forward [place] his notes, let him know that you have diabetes and need a diabetic eye exam annually.      Diabetes Mellitus and Standards of Medical Care Managing diabetes (diabetes mellitus) can be complicated. Your diabetes treatment may be managed by a team of health care providers, including:  A diet and nutrition specialist (registered dietitian).  A nurse.  A certified diabetes educator (CDE).  A diabetes specialist (endocrinologist).  An eye doctor.  A primary care provider.  A dentist.  Your health care providers follow a schedule in order to help you get the best quality of care. The following schedule is a general guideline for your diabetes management plan. Your health care providers may also give you more specific instructions. HbA1c ( hemoglobin A1c) test This test provides information about blood sugar (glucose) control over the previous 2-3 months. It is used to check whether your diabetes management plan needs to be adjusted.  If you are meeting your treatment goals, this test is done at least 2 times a year.  If you are not meeting treatment goals or if your treatment goals have changed, this test is done 4 times a year.  Blood pressure test  This test is done at every routine medical visit. For most people, the goal is less than 130/80. Ask your health care provider what your goal blood pressure should be. Dental and eye exams  Visit  your dentist two times a year.  If you have type 1 diabetes, get an eye exam 3-5 years after you are diagnosed, and then once a year after your first exam. ? If you were diagnosed with type 1 diabetes as a child, get an eye exam when you are age 33 or older and have had diabetes for 3-5 years. After the first exam, you should get an eye exam once a year.  If you have type 2 diabetes, have an eye exam as soon as you are diagnosed, and then once a year after your first exam. Foot care exam  Visual foot exams are done at every routine medical visit. The exams check for cuts, bruises, redness, blisters, sores, or other problems with the feet.  A complete foot exam is done by your health care provider once a year. This exam includes an inspection of the structure and skin of your feet, and a check of the pulses and sensation in your feet. ? Type 1 diabetes: Get your first exam 3-5 years after diagnosis. ? Type 2 diabetes: Get your first exam as soon as you are diagnosed.  Check your feet every day for cuts, bruises, redness, blisters, or sores. If you have any of these or other problems that are not healing, contact your health care provider. Kidney function test ( urine microalbumin)  This test is done once a year. ? Type  1 diabetes: Get your first test 5 years after diagnosis. ? Type 2 diabetes: Get your first test as soon as you are diagnosed.  If you have chronic kidney disease (CKD), get a serum creatinine and estimated glomerular filtration rate (eGFR) test once a year. Lipid profile (cholesterol, HDL, LDL, triglycerides)  This test should be done when you are diagnosed with diabetes, and every 5 years after the first test. If you are on medicines to lower your cholesterol, you may need to get this test done every year. ? The goal for LDL is less than 100 mg/dL (5.5 mmol/L). If you are at high risk, the goal is less than 70 mg/dL (3.9 mmol/L). ? The goal for HDL is 40 mg/dL (2.2 mmol/L)  for men and 50 mg/dL(2.8 mmol/L) for women. An HDL cholesterol of 60 mg/dL (3.3 mmol/L) or higher gives some protection against heart disease. ? The goal for triglycerides is less than 150 mg/dL (8.3 mmol/L). Immunizations  The yearly flu (influenza) vaccine is recommended for everyone 6 months or older who has diabetes.  The pneumonia (pneumococcal) vaccine is recommended for everyone 2 years or older who has diabetes. If you are 56 or older, you may get the pneumonia vaccine as a series of two separate shots.  The hepatitis B vaccine is recommended for adults shortly after they have been diagnosed with diabetes.  The Tdap (tetanus, diphtheria, and pertussis) vaccine should be given: ? According to normal childhood vaccination schedules, for children. ? Every 10 years, for adults who have diabetes.  The shingles vaccine is recommended for people who have had chicken pox and are 50 years or older. Mental and emotional health  Screening for symptoms of eating disorders, anxiety, and depression is recommended at the time of diagnosis and afterward as needed. If your screening shows that you have symptoms (you have a positive screening result), you may need further evaluation and be referred to a mental health care provider. Diabetes self-management education  Education about how to manage your diabetes is recommended at diagnosis and ongoing as needed. Treatment plan  Your treatment plan will be reviewed at every medical visit. Summary  Managing diabetes (diabetes mellitus) can be complicated. Your diabetes treatment may be managed by a team of health care providers.  Your health care providers follow a schedule in order to help you get the best quality of care.  Standards of care including having regular physical exams, blood tests, blood pressure monitoring, immunizations, screening tests, and education about how to manage your diabetes.  Your health care providers may also give you  more specific instructions based on your individual health. This information is not intended to replace advice given to you by your health care provider. Make sure you discuss any questions you have with your health care provider. Document Released: 11/19/2008 Document Revised: 10/21/2015 Document Reviewed: 10/21/2015 Elsevier Interactive Patient Education  2018 [place].   Rosuvastatin Tablets What is this medicine? ROSUVASTATIN (roe SOO va sta tin) is known as a HMG-CoA reductase inhibitor or 'statin'. It lowers cholesterol and triglycerides in the blood. This drug may also reduce the risk of heart attack, stroke, or other health problems in patients with risk factors for heart disease. Diet and lifestyle changes are often used with this drug. This medicine may be used for other purposes; ask your health care provider or pharmacist if you have questions. COMMON BRAND NAME(S): Crestor What should I tell my health care provider before I take this medicine? They need  to know if you have any of these conditions: -frequently drink alcoholic beverages -kidney disease -liver disease -muscle aches or weakness -other medical condition -an unusual or allergic reaction to rosuvastatin, other medicines, foods, dyes, or preservatives -pregnant or trying to get pregnant -breast-feeding How should I use this medicine? Take this medicine by mouth with a glass of water. Follow the directions on the prescription label. Do not cut, crush or chew this medicine. You can take this medicine with or without food. Take your doses at regular intervals. Do not take your medicine more often than directed. Talk to your pediatrician regarding the use of this medicine in children. While this drug may be prescribed for children as young as 20 years old for selected conditions, precautions do apply. Overdosage: If you think you have taken too much of this medicine contact a poison control center or emergency room at  once. NOTE: This medicine is only for you. Do not share this medicine with others. What if I miss a dose? If you miss a dose, take it as soon as you can. Do not take 2 doses within 12 hours of each other. If there are less than 12 hours until your next dose, take only that dose. Do not take double or extra doses. What may interact with this medicine? Do not take this medicine with any of the following medications: -herbal medicines like red yeast rice This medicine may also interact with the following medications: -alcohol -antacids containing aluminum hydroxide or magnesium hydroxide -cyclosporine -other medicines for high cholesterol -some medicines for HIV infection -warfarin This list may not describe all possible interactions. Give your health care provider a list of all the medicines, herbs, non-prescription drugs, or dietary supplements you use. Also tell them if you smoke, drink alcohol, or use illegal drugs. Some items may interact with your medicine. What should I watch for while using this medicine? Visit your doctor or health care professional for regular check-ups. You may need regular tests to make sure your liver is working properly. Tell your doctor or health care professional right away if you get any unexplained muscle pain, tenderness, or weakness, especially if you also have a fever and tiredness. Your doctor or health care professional may tell you to stop taking this medicine if you develop muscle problems. If your muscle problems do not go away after stopping this medicine, contact your health care professional. This medicine may affect blood sugar levels. If you have diabetes, check with your doctor or health care professional before you change your diet or the dose of your diabetic medicine. Avoid taking antacids containing aluminum, calcium or magnesium within 2 hours of taking this medicine. This drug is only part of a total heart-health program. Your doctor or a  dietician can suggest a low-cholesterol and low-fat diet to help. Avoid alcohol and smoking, and keep a proper exercise schedule. Do not use this drug if you are pregnant or breast-feeding. Serious side effects to an unborn child or to an infant are possible. Talk to your doctor or pharmacist for more information. What side effects may I notice from receiving this medicine? Side effects that you should report to your doctor or health care professional as soon as possible: -allergic reactions like skin rash, itching or hives, swelling of the face, lips, or tongue -dark urine -fever -joint pain -muscle cramps, pain -redness, blistering, peeling or loosening of the skin, including inside the mouth -trouble passing urine or change in the amount of urine -unusually  weak or tired -yellowing of the eyes or skin Side effects that usually do not require medical attention (report to your doctor or health care professional if they continue or are bothersome): -constipation -heartburn -nausea -stomach gas, pain, upset This list may not describe all possible side effects. Call your doctor for medical advice about side effects. You may report side effects to FDA at 1-800-FDA-1088. Where should I keep my medicine? Keep out of the reach of children. Store at room temperature between 20 and 25 degrees C (68 and 77 degrees F). Keep container tightly closed (protect from moisture). Throw away any unused medicine after the expiration date. NOTE: This sheet is a summary. It may not cover all possible information. If you have questions about this medicine, talk to your doctor, pharmacist, or health care provider.  2018 Elsevier/Gold Standard (2014-07-08 13:33:08)

## 2018-01-14 LAB — COMPLETE METABOLIC PANEL WITH GFR
AG Ratio: 1.6 (calc) (ref 1.0–2.5)
ALBUMIN MSPROF: 4.2 g/dL (ref 3.6–5.1)
ALKALINE PHOSPHATASE (APISO): 77 U/L (ref 33–130)
ALT: 10 U/L (ref 6–29)
AST: 11 U/L (ref 10–35)
BUN: 13 mg/dL (ref 7–25)
CALCIUM: 9.9 mg/dL (ref 8.6–10.4)
CO2: 27 mmol/L (ref 20–32)
Chloride: 108 mmol/L (ref 98–110)
Creat: 0.81 mg/dL (ref 0.50–0.99)
GFR, EST AFRICAN AMERICAN: 89 mL/min/{1.73_m2} (ref >=60)
GFR, EST NON AFRICAN AMERICAN: 77 mL/min/{1.73_m2} (ref >=60)
GLOBULIN: 2.7 g/dL (ref 1.9–3.7)
GLUCOSE: 109 mg/dL — AB (ref 65–99)
Potassium: 4.1 mmol/L (ref 3.5–5.3)
SODIUM: 141 mmol/L (ref 135–146)
TOTAL PROTEIN: 6.9 g/dL (ref 6.1–8.1)
Total Bilirubin: 0.3 mg/dL (ref 0.2–1.2)

## 2018-01-14 LAB — CBC WITH DIFFERENTIAL/PLATELET
BASOS PCT: 0.5 %
Basophils Absolute: 44 cells/uL (ref 0–200)
EOS ABS: 150 {cells}/uL (ref 15–500)
EOS PCT: 1.7 %
HEMATOCRIT: 40.3 % (ref 35.0–45.0)
HEMOGLOBIN: 14 g/dL (ref 11.7–15.5)
LYMPHS ABS: 2983 {cells}/uL (ref 850–3900)
MCH: 31.2 pg (ref 27.0–33.0)
MCHC: 34.7 g/dL (ref 32.0–36.0)
MCV: 89.8 fL (ref 80.0–100.0)
MPV: 10.1 fL (ref 7.5–12.5)
Monocytes Relative: 8.1 %
NEUTROS ABS: 4910 {cells}/uL (ref 1500–7800)
Neutrophils Relative %: 55.8 %
Platelets: 253 10*3/uL (ref 140–400)
RBC: 4.49 10*6/uL (ref 3.80–5.10)
RDW: 12.6 % (ref 11.0–15.0)
Total Lymphocyte: 33.9 %
WBC mixed population: 713 cells/uL (ref 200–950)
WBC: 8.8 10*3/uL (ref 3.8–10.8)

## 2018-01-14 LAB — MAGNESIUM: Magnesium: 2 mg/dL (ref 1.5–2.5)

## 2018-01-14 LAB — LIPID PANEL
Cholesterol: 189 mg/dL (ref <200)
HDL: 37 mg/dL — ABNORMAL LOW (ref >50)
LDL Cholesterol (Calc): 117 mg/dL (calc) — ABNORMAL HIGH
Non-HDL Cholesterol (Calc): 152 mg/dL (calc) — ABNORMAL HIGH (ref <130)
Total CHOL/HDL Ratio: 5.1 (calc) — ABNORMAL HIGH (ref <5.0)
Triglycerides: 230 mg/dL — ABNORMAL HIGH (ref <150)

## 2018-01-14 LAB — HEMOGLOBIN A1C
EAG (MMOL/L): 8.4 (calc)
Hgb A1c MFr Bld: 6.9 % of total Hgb — ABNORMAL HIGH (ref <5.7)
Mean Plasma Glucose: 151 (calc)

## 2018-01-14 LAB — TSH: TSH: 2.47 mIU/L (ref 0.40–4.50)

## 2018-01-18 ENCOUNTER — Other Ambulatory Visit: Payer: Self-pay | Admitting: Adult Health

## 2018-01-20 ENCOUNTER — Other Ambulatory Visit: Payer: Self-pay | Admitting: Internal Medicine

## 2018-01-20 DIAGNOSIS — I10 Essential (primary) hypertension: Secondary | ICD-10-CM

## 2018-01-20 MED ORDER — AMLODIPINE BESY-BENAZEPRIL HCL 10-40 MG PO CAPS: ORAL_CAPSULE | ORAL | 1 refills | Status: DC

## 2018-02-12 ENCOUNTER — Other Ambulatory Visit: Payer: Self-pay | Admitting: Internal Medicine

## 2018-04-18 ENCOUNTER — Other Ambulatory Visit: Payer: Self-pay | Admitting: Adult Health

## 2018-04-21 NOTE — Progress Notes (Signed)
Complete Physical  Assessment and Plan:  Encounter for general adult medical examination with abnormal findings  Essential hypertension - continue medications, DASH diet, exercise and monitor at home. Call if greater than 130/80.  -     CBC with Differential/Platelet -     CMP/GFR -     TSH -     Urinalysis, Routine w reflex microscopic -     Microalbumin / creatinine urine ratio -     EKG 12-Lead  T2DM Currently well controlled by low dose metformin Education: Reviewed 'ABCs' of diabetes management (respective goals in parentheses):  A1C (<7), blood pressure (<130/80), and cholesterol (LDL <70) Eye Exam yearly and Dental Exam every 6 months. She will schedule diabetic eye exam with Dr. Delman Cheadle Dietary recommendations Physical Activity recommendations  CKD stage 2 due to type 2 diabetes mellitus [facility]) Discussed general issues about diabetes pathophysiology and management., Educational material distributed., Suggested low cholesterol diet., Encouraged aerobic exercise., Discussed foot care., Reminded to get yearly retinal exam. -     CMP/GFR -     Microalbumin  Hyperlipidemia, unspecified hyperlipidemia type -continue medications, check lipids, decrease fatty foods, increase activity.  -     Lipid panel  Tobacco use Declines CT this year; will get CXR Actively cutting back on smoking -     EKG 12-Lead -     Chest XR  Vitamin D deficiency -     VITAMIN D 25 Hydroxy (Vit-D Deficiency, Fractures)  Anxiety Well managed by current regimen; continue medications Stress management techniques discussed, increase water, good sleep hygiene discussed, increase exercise, and increase veggies.   Encounter for long-term (current) use of medications -     Magnesium  Anemia, unspecified type -     Vitamin B12  Sinusitis Discussed the importance of avoiding unnecessary antibiotic therapy - zpak printed to hold and take only if not improving in 3 days with conservative  interventions Suggested symptomatic OTC remedies. Nasal saline spray for congestion. Nasal steroids, allergy pill, oral steroids offered Follow up as needed.   Discussed med's effects and SE's. Screening labs and tests as requested with regular follow-up as recommended. Over 40 minutes of exam, counseling, chart review, and complex, high level critical decision making was performed this visit.   Future Appointments  Date Time Provider [place]  04/27/2019  9:00 AM Liane Comber, NP [facility] None     HPI  63 y.o. female  presents for a complete physical and follow up for has Essential hypertension; CKD stage 2 due to type 2 diabetes mellitus ([place]); Anxiety; Medication management; Hyperlipidemia associated with type 2 diabetes mellitus ([place]); Tobacco use; Obesity (BMI 30.0-34.9); Vitamin D deficiency; and Type 2 diabetes mellitus ([place]) on their problem list.   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 Currently at home due to covid 19.   Today she reports 2 weeks of nasal congestion/sinus pressure/pain, worse on R, sinus headache, post-nasal drip. No fever/chills, sore throat. She has mild non-productive cough. She is taking alka-seltzer and flonase.  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 She is sleeping well at night, denies SE.   she currently continues to smoke 1/10 pack a day, down from previous 1 pack  a day; discussed risks associated with smoking, patient is ready to quit, actively working on cutting down. Wellbutrin has been very helpful.   BMI is Body mass index is 30.85 kg/m., she has been working on diet, exercise is limited but very active on her feet. She has been  cutting down on soda, down to 1/2 of tiny can per day. She does drink 2 cups of coffee daily. She drinks 60+ fluid ounces of water daily. She estimates 4 servings  Wt Readings from Last 3 Encounters:  04/22/18 197 lb (89.4 kg)  01/13/18 199 lb (90.3 kg)  10/08/17 202 lb 6.4 oz (91.8 kg)   Her blood pressure has been controlled at work, she checks it occ, today their BP is BP: 122/72  BP Readings from Last 3 Encounters:  04/22/18 122/72  01/13/18 118/70  10/08/17 126/80   She does workout, walking, running, she will have some SOB with running only, not with inclines, no accompaniments. She denies chest pain,  Dizziness, diaphoresis. She does continue to smoke and likely has COPD but no diagnosis, no PFTs/no CXR.  She is not on cholesterol medication (taking rosuvastatin 20 mg 2-3 times weekly) and denies myalgias. Her cholesterol is not at goal. The cholesterol last visit was:   Lab Results  Component Value Date   CHOL 189 01/13/2018   HDL 37 (L) 01/13/2018   LDLCALC 117 (H) 01/13/2018   TRIG 230 (H) 01/13/2018   CHOLHDL 5.1 (H) 01/13/2018   She has been working on diet and exercise for T2 diabetes (treated by metformin 500 mg daily) , she is on bASA, she is on ACE/ARB, she is on metformin taken off invokana last visit due to doing better and denies paresthesia of the feet, polydipsia, polyuria and visual disturbances. Last A1C in the office was:  Lab Results  Component Value Date   HGBA1C 6.9 (H) 01/13/2018   Last GFR: Lab Results  Component Value Date   GFRNONAA 77 01/13/2018   Patient is on Vitamin D supplement.   Lab Results  Component Value Date   VD25OH 38 03/19/2016      Current Medications:  Current Outpatient Medications on File Prior to Visit  Medication Sig Dispense Refill  . amLODipine-benazepril (LOTREL) 10-40 MG capsule Take 1 capsule daily for   BP 90 capsule 1  . aspirin EC 81 MG tablet Take 81 mg by mouth daily.    Marland Kitchen buPROPion (WELLBUTRIN XL) 150 MG 24 hr  tablet TAKE 1 TABLET BY MOUTH EVERY DAY 90 tablet 1  . metFORMIN (GLUCOPHAGE-XR) 500 MG 24 hr tablet TAKE 1 TABLET BY MOUTH DAILY WITH LARGEST MEAL OF THE DAY. 30 tablet 5  . rosuvastatin (CRESTOR) 20 MG tablet Take 1 tab 3 days a week on M/W/F for cholesterol. 30 tablet 1  . VITAMIN D, CHOLECALCIFEROL, PO Take 5,000 Units by mouth.      No current facility-administered medications on file prior to visit.    Allergies:  Allergies  Allergen Reactions  . Other     Tylenol #3  . Tylox [Oxycodone-Acetaminophen]    Medical History:  She has Essential hypertension; CKD stage 2 due to type 2 diabetes mellitus ([place]); Anxiety; Medication management; Hyperlipidemia associated with type 2 diabetes mellitus ([place]); Tobacco use; Obesity (BMI 30.0-34.9); Vitamin D deficiency; and Type 2 diabetes mellitus ([place]) on their problem list.   Health Maintenance:   Immunization History  Administered Date(s) Administered  . Influenza-Unspecified 10/28/2014  . Pneumococcal Polysaccharide-23 03/19/2016  . Td  10/07/2003  . Tdap 03/19/2016   Tetanus: 2018 Pneumovax: 2018 Prevnar 13:  Defer to next year Flu vaccine:2019 at work  Zostavax: N/A  LMP s/p hysterectomy Pap: 2008 before hysterectomy MGM: 01/2017 patient will schedule  DEXA: due age 1 Colonoscopy:12/2015 DUE 12/2018 [place] vaginal 2008 CXR 2008  Last Dental Exam: Pincus Badder, last visit 03/2018 Last Eye Exam: glasses, [employer], Sees Dr. Delman Cheadle for diabetic eye exam    Patient Care Team: Unk Pinto, MD as PCP - General (Internal Medicine)  Surgical History:  She has a past surgical history that includes Cesarean section; Abdominal hysterectomy; Cholecystectomy; ovarian cystectomy (1985); and Tonsillectomy. Family History:  Herfamily history includes AAA (abdominal aortic aneurysm) in her maternal grandfather; Asthma in her daughter; Breast cancer (age of onset: 63) in her mother; Congestive Heart Failure in her father;  Diabetes in her brother and mother; Heart attack (age of onset: 12) in her father; Heart disease in her father and mother; Hypertension in her father; Multiple sclerosis in her brother; Prostate cancer (age of onset: 53) in her father. Social History:  She reports that she has been smoking cigarettes. She started smoking about 40 years ago. She has a 30.00 pack-year smoking history. She has never used smokeless tobacco. She reports that she does not drink alcohol or use drugs.  Review of Systems: Review of Systems  Constitutional: Negative.  Negative for weight loss.  HENT: Positive for congestion and sinus pain. Negative for ear discharge, ear pain, hearing loss, nosebleeds, sore throat and tinnitus.   Eyes: Negative.   Respiratory: Positive for cough. Negative for hemoptysis, sputum production, wheezing and stridor.   Cardiovascular: Negative.   Gastrointestinal: Negative for abdominal pain, blood in stool, constipation, diarrhea, heartburn, melena, nausea and vomiting.  Genitourinary: Negative.   Musculoskeletal: Negative for back pain, falls, joint pain, myalgias and neck pain.  Skin: Negative.   Neurological: Negative for dizziness, tingling, tremors, sensory change, speech change, focal weakness, seizures, loss of consciousness and headaches.  Psychiatric/Behavioral: Negative.     Physical Exam: Estimated body mass index is 30.85 kg/m as calculated from the following:   Height as of this encounter: 5\' 7"  (1.702 m).   Weight as of this encounter: 197 lb (89.4 kg). BP 122/72   Pulse 81   Temp 97.7 F (36.5 C)   Ht 5\' 7"  (1.702 m)   Wt 197 lb (89.4 kg)   SpO2 96%   BMI 30.85 kg/m  General Appearance: Well nourished, in no apparent distress.  Eyes: PERRLA, EOMs, conjunctiva no swelling or erythema, normal fundi and vessels.  Sinuses: No Frontal/maxillary tenderness  ENT/Mouth: Ext aud canals clear, normal light reflex with TMs without erythema, bulging. Good dentition. No  erythema, swelling, or exudate on post pharynx. Tonsils not swollen or erythematous. Hearing normal.  Neck: Supple, thyroid normal. No bruits  Respiratory: Respiratory effort normal, BS equal bilaterally without rales, rhonchi, wheezing or stridor.  Cardio: RRR without murmurs, rubs or gallops. Brisk peripheral pulses without edema.  Chest: symmetric, with normal excursions and percussion.  Breasts: Symmetric, without lumps, nipple discharge, retractions.  Abdomen: Soft, nontender, no guarding, rebound, hernias, masses, or organomegaly.  Lymphatics: Non tender without lymphadenopathy.  Genitourinary: Defer Musculoskeletal: Full ROM all peripheral extremities,5/5 strength, and normal gait, + tenderness at right anterior hip, no Si tenderness, no trochanteric tenderness, full ROM, pain with flexion right hip.   Skin: Warm, dry without rashes, lesions, ecchymosis. Neuro: Cranial nerves intact, reflexes equal bilaterally. Normal muscle tone, no cerebellar symptoms.  Sensation intact.  Psych: Awake and oriented X 3, normal affect, Insight and Judgment appropriate.   EKG: WNL, IRBBB no ST changes. AORTA SCAN: defer  Izora Ribas 9:25 AM [facility]

## 2018-04-22 ENCOUNTER — Ambulatory Visit: Payer: 59 | Admitting: Adult Health

## 2018-04-22 ENCOUNTER — Encounter: Payer: Self-pay | Admitting: Adult Health

## 2018-04-22 ENCOUNTER — Other Ambulatory Visit: Payer: Self-pay

## 2018-04-22 VITALS — BP 122/72 | HR 81 | Temp 97.7°F | Ht 67.0 in | Wt 197.0 lb

## 2018-04-22 DIAGNOSIS — Z8249 Family history of ischemic heart disease and other diseases of the circulatory system: Secondary | ICD-10-CM

## 2018-04-22 DIAGNOSIS — E559 Vitamin D deficiency, unspecified: Secondary | ICD-10-CM

## 2018-04-22 DIAGNOSIS — Z720 Tobacco use: Secondary | ICD-10-CM

## 2018-04-22 DIAGNOSIS — I10 Essential (primary) hypertension: Secondary | ICD-10-CM

## 2018-04-22 DIAGNOSIS — E1169 Type 2 diabetes mellitus with other specified complication: Secondary | ICD-10-CM | POA: Diagnosis not present

## 2018-04-22 DIAGNOSIS — E669 Obesity, unspecified: Secondary | ICD-10-CM

## 2018-04-22 DIAGNOSIS — Z0001 Encounter for general adult medical examination with abnormal findings: Secondary | ICD-10-CM

## 2018-04-22 DIAGNOSIS — Z136 Encounter for screening for cardiovascular disorders: Secondary | ICD-10-CM | POA: Diagnosis not present

## 2018-04-22 DIAGNOSIS — N182 Chronic kidney disease, stage 2 (mild): Secondary | ICD-10-CM

## 2018-04-22 DIAGNOSIS — D649 Anemia, unspecified: Secondary | ICD-10-CM

## 2018-04-22 DIAGNOSIS — R5383 Other fatigue: Secondary | ICD-10-CM

## 2018-04-22 DIAGNOSIS — E1122 Type 2 diabetes mellitus with diabetic chronic kidney disease: Secondary | ICD-10-CM

## 2018-04-22 DIAGNOSIS — F419 Anxiety disorder, unspecified: Secondary | ICD-10-CM

## 2018-04-22 DIAGNOSIS — N181 Chronic kidney disease, stage 1: Secondary | ICD-10-CM

## 2018-04-22 DIAGNOSIS — E785 Hyperlipidemia, unspecified: Secondary | ICD-10-CM

## 2018-04-22 DIAGNOSIS — Z0000 Encounter for general adult medical examination without abnormal findings: Secondary | ICD-10-CM

## 2018-04-22 DIAGNOSIS — Z79899 Other long term (current) drug therapy: Secondary | ICD-10-CM

## 2018-04-22 MED ORDER — PREDNISONE 20 MG PO TABS: ORAL_TABLET | ORAL | 0 refills | Status: DC

## 2018-04-22 MED ORDER — AZITHROMYCIN 250 MG PO TABS: ORAL_TABLET | ORAL | 1 refills | Status: AC

## 2018-04-22 NOTE — Patient Instructions (Addendum)
     Joyce Shannon , Thank you for taking time to come for your Medicare Wellness Visit. I appreciate your ongoing commitment to your health goals. Please review the following plan we discussed and let me know if I can assist you in the future.   These are the goals we discussed: Goals    . Blood Pressure < 130/80    . Exercise 150 min per week of moderate intesity acitivty    . HEMOGLOBIN A1C < 7.0    . LDL CALC < 70    . Quit Smoking    . Weight (lb) < 185 lb (83.9 kg)       This is a list of the screening recommended for you and due dates:  Health Maintenance  Topic Date Due  . Eye exam for diabetics  01/03/1966  . Hemoglobin A1C  07/15/2018  . Colon Cancer Screening  12/07/2018  . Mammogram  01/09/2019  . Complete foot exam   04/22/2019  . Tetanus Vaccine  03/19/2026  . Flu Shot  Completed  .  Hepatitis C: One time screening is recommended by Center for Disease Control  (CDC) for  adults born from [birth year] through 1967.   Completed  . HIV Screening  Completed  . Pap Smear  Discontinued    Get on allegra/fexofenadine or other allergy medication - continue with flonase  Call back if sinuses not improving   Get Chest xray at some point later this year - [address] [facility]    Know what a healthy weight is for you (roughly BMI <25) and aim to maintain this  Aim for 7+ servings of fruits and vegetables daily  65-80+ fluid ounces of water or unsweet tea for healthy kidneys  Limit to max 1 drink of alcohol per day; avoid smoking/tobacco  Limit animal fats in diet for cholesterol and heart health - choose grass fed whenever available  Avoid highly processed foods, and foods high in saturated/trans fats  Aim for low stress - take time to unwind and care for your mental health  Aim for 150 min of moderate intensity exercise weekly for heart health, and weights twice weekly for bone health  Aim for 7-9 hours of sleep daily

## 2018-04-23 ENCOUNTER — Encounter: Payer: Self-pay | Admitting: Adult Health

## 2018-04-23 DIAGNOSIS — E538 Deficiency of other specified B group vitamins: Secondary | ICD-10-CM

## 2018-04-23 HISTORY — DX: Deficiency of other specified B group vitamins: E53.8

## 2018-04-23 LAB — COMPLETE METABOLIC PANEL WITH GFR
AG Ratio: 1.6 (calc) (ref 1.0–2.5)
ALKALINE PHOSPHATASE (APISO): 78 U/L (ref 37–153)
ALT: 12 U/L (ref 6–29)
AST: 12 U/L (ref 10–35)
Albumin: 4.4 g/dL (ref 3.6–5.1)
BUN: 15 mg/dL (ref 7–25)
CO2: 27 mmol/L (ref 20–32)
CREATININE: 0.85 mg/dL (ref 0.50–0.99)
Calcium: 9.9 mg/dL (ref 8.6–10.4)
Chloride: 106 mmol/L (ref 98–110)
GFR, Est African American: 84 mL/min/{1.73_m2} (ref >=60)
GFR, Est Non African American: 72 mL/min/{1.73_m2} (ref >=60)
Globulin: 2.8 g/dL (calc) (ref 1.9–3.7)
Glucose, Bld: 100 mg/dL — ABNORMAL HIGH (ref 65–99)
Potassium: 4.2 mmol/L (ref 3.5–5.3)
Sodium: 141 mmol/L (ref 135–146)
Total Bilirubin: 0.6 mg/dL (ref 0.2–1.2)
Total Protein: 7.2 g/dL (ref 6.1–8.1)

## 2018-04-23 LAB — VITAMIN B12: Vitamin B-12: 307 pg/mL (ref 200–1100)

## 2018-04-23 LAB — LIPID PANEL
Cholesterol: 212 mg/dL — ABNORMAL HIGH (ref <200)
HDL: 46 mg/dL — ABNORMAL LOW (ref >=50)
LDL CHOLESTEROL (CALC): 132 mg/dL — AB
Non-HDL Cholesterol (Calc): 166 mg/dL (calc) — ABNORMAL HIGH (ref <130)
Total CHOL/HDL Ratio: 4.6 (calc) (ref <5.0)
Triglycerides: 197 mg/dL — ABNORMAL HIGH (ref <150)

## 2018-04-23 LAB — CBC WITH DIFFERENTIAL/PLATELET
Absolute Monocytes: 764 cells/uL (ref 200–950)
Basophils Absolute: 59 cells/uL (ref 0–200)
Basophils Relative: 0.6 %
Eosinophils Absolute: 157 cells/uL (ref 15–500)
Eosinophils Relative: 1.6 %
HEMATOCRIT: 42.5 % (ref 35.0–45.0)
Hemoglobin: 14.7 g/dL (ref 11.7–15.5)
LYMPHS ABS: 2695 {cells}/uL (ref 850–3900)
MCH: 30.9 pg (ref 27.0–33.0)
MCHC: 34.6 g/dL (ref 32.0–36.0)
MCV: 89.5 fL (ref 80.0–100.0)
MPV: 9.9 fL (ref 7.5–12.5)
Monocytes Relative: 7.8 %
Neutro Abs: 6125 cells/uL (ref 1500–7800)
Neutrophils Relative %: 62.5 %
Platelets: 258 10*3/uL (ref 140–400)
RBC: 4.75 10*6/uL (ref 3.80–5.10)
RDW: 12.3 % (ref 11.0–15.0)
Total Lymphocyte: 27.5 %
WBC: 9.8 10*3/uL (ref 3.8–10.8)

## 2018-04-23 LAB — HEMOGLOBIN A1C
Hgb A1c MFr Bld: 7.1 % of total Hgb — ABNORMAL HIGH (ref <5.7)
Mean Plasma Glucose: 157 (calc)
eAG (mmol/L): 8.7 (calc)

## 2018-04-23 LAB — VITAMIN D 25 HYDROXY (VIT D DEFICIENCY, FRACTURES): Vit D, 25-Hydroxy: 31 ng/mL (ref 30–100)

## 2018-04-23 LAB — TSH: TSH: 2.81 mIU/L (ref 0.40–4.50)

## 2018-04-23 LAB — MAGNESIUM: Magnesium: 2.1 mg/dL (ref 1.5–2.5)

## 2018-07-08 ENCOUNTER — Other Ambulatory Visit: Payer: Self-pay | Admitting: Adult Health

## 2018-07-21 NOTE — Progress Notes (Signed)
FOLLOW UP  Assessment and Plan:   Hypertension Well controlled with current medications  Monitor blood pressure at home; patient to call if consistently greater than 130/80 Continue DASH diet.   Reminder to go to the ER if any CP, SOB, nausea, dizziness, severe HA, changes vision/speech, left arm numbness and tingling and jaw pain.  Cholesterol Currently above goal; newly on rosuvastatin 20 mg three days a week but forgetting to take; she feels she will do better taking daily in AM; 5 mg tabs sent in Discussed LDL goal <70 Continue low cholesterol diet and exercise.  Check lipid panel.   Diabetes with diabetic chronic kidney disease On metformin 500 mg daily Start daily bASA, recommended patient get diabetic eye exam, have results forwarded  Continue diet and exercise.  Perform daily foot/skin check, notify office of any concerning changes.  Check A1C  Obesity with co morbidities Long discussion about weight loss, diet, and exercise Recommended diet heavy in fruits and veggies and low in animal meats, cheeses, and dairy products, appropriate calorie intake Discussed ideal weight for height  Patient will work on increasing exercise, moderate portions Will follow up in 3 months  Vitamin D Def Below goal at recent check; continue to recommend supplementation for goal of 60-100 Hasn't been taking; strategies for med compliance discussed Defer vitamin D level  Anxiety Well managed at this time Stress management techniques discussed, increase water, good sleep hygiene discussed, increase exercise, and increase veggies.   Tobacco use Discussed risks associated with tobacco use and advised to reduce or quit Patient is ready to do so, doing well tapering down on wellbutrin, down to 2 cigs a day, plans to quit by labor day Will follow up at the next visit  Continue diet and meds as discussed. Further disposition pending results of labs. Discussed med's effects and SE's.   Over 30  minutes of exam, counseling, chart review, and critical decision making was performed.   Future Appointments  Date Time Provider [place]  10/23/2018  4:30 PM Liane Comber, NP [facility] None  04/27/2019  9:00 AM Liane Comber, NP [facility] None    ----------------------------------------------------------------------------------------------------------------------  HPI 63 y.o. female  presents for 3 month follow up on hypertension, cholesterol, diabetes, obesity, anxiety and vitamin D deficiency.   she has a diagnosis of anxiety and  on wellbutrin 150 mg daily and doing well with this, felt she no longer needed zolof and xanax, reports this medication has been excellent fit and she feels much more in control. She is sleeping well at night, denies SE.   she currently continues to smoke 1/10 pack a day, or none at all, down from previous 1 pack a day; discussed risks associated with smoking, patient is ready to quit, actively working on cutting down. Wellbutrin has been very helpful. Plans to quit completely by labor day.   BMI is Body mass index is 31.32 kg/m., she has been working on diet, exercise is limited due to husband's health at this time, had hip replacement, heart valve, stage 4 kidneys. She has been trying to walk more, taking steps instead of elevater. She reports water intake is excellent, sleeping well most nights. She is using fork over knife app for recipes. She does weigh at home, admits if she eats french fries or soda and this causes her to rebound on weight.  Wt Readings from Last 3 Encounters:  07/23/18 200 lb (90.7 kg)  04/22/18 197 lb (89.4 kg)  01/13/18 199 lb (90.3 kg)  Her blood pressure has been controlled at home (reports at home running 120s/70s), today their BP is BP: (!) 142/82 attributes this to stressful day   She does not workout. She denies chest pain, shortness of breath, dizziness.   She is newly on cholesterol medication (rosuvastatin 20  mg MWF, but admits forgetting to take frequently in the evening) and denies myalgias. Her cholesterol is not at goal. The cholesterol last visit was:   Lab Results  Component Value Date   CHOL 212 (H) 04/22/2018   HDL 46 (L) 04/22/2018   LDLCALC 132 (H) 04/22/2018   TRIG 197 (H) 04/22/2018   CHOLHDL 4.6 04/22/2018    She has not been working on diet and exercise for T2DM newly back on metformin 500 mg daily, and denies foot ulcerations, increased appetite, nausea, paresthesia of the feet, polydipsia, polyuria, visual disturbances, vomiting and weight loss. SheLast A1C in the office was:  Lab Results  Component Value Date   HGBA1C 7.1 (H) 04/22/2018   Patient is on Vitamin D supplement, 5000 IU but admits not taking very regularly  Lab Results  Component Value Date   VD25OH 31 04/22/2018       Current Medications:  Current Outpatient Medications on File Prior to Visit  Medication Sig  . aspirin EC 81 MG tablet Take 81 mg by mouth daily.  Marland Kitchen buPROPion (WELLBUTRIN XL) 150 MG 24 hr tablet TAKE 1 TABLET BY MOUTH EVERY DAY  . metFORMIN (GLUCOPHAGE-XR) 500 MG 24 hr tablet TAKE 1 TABLET BY MOUTH DAILY WITH LARGEST MEAL OF THE DAY.  . rosuvastatin (CRESTOR) 20 MG tablet TAKE 1 TAB 3 DAYS A WEEK ON M/W/F FOR CHOLESTEROL.  Marland Kitchen VITAMIN D, CHOLECALCIFEROL, PO Take 5,000 Units by mouth.    No current facility-administered medications on file prior to visit.      Allergies:  Allergies  Allergen Reactions  . Other     Tylenol #3  . Tylox [Oxycodone-Acetaminophen]      Medical History:  Past Medical History:  Diagnosis Date  . Anemia   . Asthma   . B12 deficiency 04/23/2018  . Depression   . Diabetes mellitus without complication ([place])   . Hyperlipidemia   . Hypertension   . Right bundle branch block 05/2005  . Vitamin D deficiency    Family history- Reviewed and unchanged Social history- Reviewed and unchanged   Review of Systems:  Review of Systems  Constitutional: Negative  for malaise/fatigue and weight loss.  HENT: Negative for hearing loss and tinnitus.   Eyes: Negative for blurred vision and double vision.  Respiratory: Negative for cough, shortness of breath and wheezing.   Cardiovascular: Negative for chest pain, palpitations, orthopnea, claudication and leg swelling.  Gastrointestinal: Negative for abdominal pain, blood in stool, constipation, diarrhea, heartburn, melena, nausea and vomiting.  Genitourinary: Negative.   Musculoskeletal: Negative for joint pain and myalgias.  Skin: Negative for rash.  Neurological: Negative for dizziness, tingling, sensory change, weakness and headaches.  Endo/Heme/Allergies: Negative for polydipsia.  Psychiatric/Behavioral: Negative.   All other systems reviewed and are negative.   Physical Exam: BP (!) 142/82   Pulse 75   Temp (!) 96.6 F (35.9 C)   Ht 5\' 7"  (1.702 m)   Wt 200 lb (90.7 kg)   SpO2 96%   BMI 31.32 kg/m  Wt Readings from Last 3 Encounters:  07/23/18 200 lb (90.7 kg)  04/22/18 197 lb (89.4 kg)  01/13/18 199 lb (90.3 kg)   General Appearance: Well nourished,  in no apparent distress. Eyes: PERRLA, EOMs, conjunctiva no swelling or erythema Sinuses: No Frontal/maxillary tenderness ENT/Mouth: Ext aud canals clear, TMs without erythema, bulging. No erythema, swelling, or exudate on post pharynx.  Tonsils not swollen or erythematous. Hearing normal.  Neck: Supple, thyroid normal.  Respiratory: Respiratory effort normal, BS equal bilaterally without rales, rhonchi, wheezing or stridor.  Cardio: RRR with no MRGs. Brisk peripheral pulses without edema.  Abdomen: Soft, + BS.  Non tender, no guarding, rebound, hernias, masses. Lymphatics: Non tender without lymphadenopathy.  Musculoskeletal: Full ROM, 5/5 strength, Normal gait Skin: Warm, dry without rashes, lesions, ecchymosis.  Neuro: Cranial nerves intact. No cerebellar symptoms.  Psych: Awake and oriented X 3, normal affect, Insight and Judgment  appropriate.    Izora Ribas, NP 4:40 PM [facility]

## 2018-07-23 ENCOUNTER — Ambulatory Visit: Payer: 59 | Admitting: Adult Health

## 2018-07-23 ENCOUNTER — Encounter: Payer: Self-pay | Admitting: Adult Health

## 2018-07-23 ENCOUNTER — Other Ambulatory Visit: Payer: Self-pay | Admitting: Internal Medicine

## 2018-07-23 ENCOUNTER — Other Ambulatory Visit: Payer: Self-pay

## 2018-07-23 VITALS — BP 142/82 | HR 75 | Temp 96.6°F | Ht 67.0 in | Wt 200.0 lb

## 2018-07-23 DIAGNOSIS — N181 Chronic kidney disease, stage 1: Secondary | ICD-10-CM

## 2018-07-23 DIAGNOSIS — I10 Essential (primary) hypertension: Secondary | ICD-10-CM | POA: Diagnosis not present

## 2018-07-23 DIAGNOSIS — E559 Vitamin D deficiency, unspecified: Secondary | ICD-10-CM | POA: Diagnosis not present

## 2018-07-23 DIAGNOSIS — E1169 Type 2 diabetes mellitus with other specified complication: Secondary | ICD-10-CM | POA: Diagnosis not present

## 2018-07-23 DIAGNOSIS — Z720 Tobacco use: Secondary | ICD-10-CM

## 2018-07-23 DIAGNOSIS — Z79899 Other long term (current) drug therapy: Secondary | ICD-10-CM

## 2018-07-23 DIAGNOSIS — E1122 Type 2 diabetes mellitus with diabetic chronic kidney disease: Secondary | ICD-10-CM | POA: Diagnosis not present

## 2018-07-23 DIAGNOSIS — E669 Obesity, unspecified: Secondary | ICD-10-CM

## 2018-07-23 DIAGNOSIS — E785 Hyperlipidemia, unspecified: Secondary | ICD-10-CM

## 2018-07-23 DIAGNOSIS — N182 Chronic kidney disease, stage 2 (mild): Secondary | ICD-10-CM

## 2018-07-23 DIAGNOSIS — F419 Anxiety disorder, unspecified: Secondary | ICD-10-CM

## 2018-07-23 MED ORDER — ROSUVASTATIN CALCIUM 5 MG PO TABS: ORAL_TABLET | ORAL | 1 refills | Status: DC

## 2018-07-23 NOTE — Patient Instructions (Addendum)
Goals    . Blood Pressure < 130/80    . Exercise 150 min per week of moderate intesity acitivty    . HEMOGLOBIN A1C < 7.0    . LDL CALC < 70    . Quit Smoking (pt-stated)     By labor day    . Weight (lb) < 190 lb (86.2 kg)      Please call insurance about preferred glucometer and call us back so we can order  Best to check fasting - start with just a few days a week, ideal is <100, diabetic goal is <130  Take all meds in the morning to try to take consistently     Blood Glucose Monitoring, Adult Monitoring your blood sugar (glucose) is an important part of managing your diabetes (diabetes mellitus). Blood glucose monitoring involves checking your blood glucose as often as directed and keeping a record (log) of your results over time. Checking your blood glucose regularly and keeping a blood glucose log can:  Help you and your health care provider adjust your diabetes management plan as needed, including your medicines or insulin.  Help you understand how food, exercise, illnesses, and medicines affect your blood glucose.  Let you know what your blood glucose is at any time. You can quickly find out if you have low blood glucose (hypoglycemia) or high blood glucose (hyperglycemia). Your health care provider will set individualized treatment goals for you. Your goals will be based on your age, other medical conditions you have, and how you respond to diabetes treatment. Generally, the goal of treatment is to maintain the following blood glucose levels:  Before meals (preprandial): 80-130 mg/dL (4.4-7.2 mmol/L).  After meals (postprandial): below 180 mg/dL (10 mmol/L).  A1c level: less than 7%. Supplies needed:  Blood glucose meter.  Test strips for your meter. Each meter has its own strips. You must use the strips that came with your meter.  A needle to prick your finger (lancet). Do not use a lancet more than one time.  A device that holds the lancet (lancing device).  A  journal or log book to write down your results. How to check your blood glucose  1. Wash your hands with soap and water. 2. Prick the side of your finger (not the tip) with the lancet. Use a different finger each time. 3. Gently rub the finger until a small drop of blood appears. 4. Follow instructions that come with your meter for inserting the test strip, applying blood to the strip, and using your blood glucose meter. 5. Write down your result and any notes. Some meters allow you to use areas of your body other than your finger (alternative sites) to test your blood. The most common alternative sites are:  Forearm.  Thigh.  Palm of the hand. If you think you may have hypoglycemia, or if you have a history of not knowing when your blood glucose is getting low (hypoglycemia unawareness), do not use alternative sites. Use your finger instead. Alternative sites may not be as accurate as the fingers, because blood flow is slower in these areas. This means that the result you get may be delayed, and it may be different from the result that you would get from your finger. Follow these instructions at home: Blood glucose log   Every time you check your blood glucose, write down your result. Also write down any notes about things that may be affecting your blood glucose, such as your diet and exercise for  the day. This information can help you and your health care provider: ? Look for patterns in your blood glucose over time. ? Adjust your diabetes management plan as needed.  Check if your meter allows you to download your records to a computer. Most glucose meters store a record of glucose readings in the meter. If you have type 1 diabetes:  Check your blood glucose 2 or more times a day.  Also check your blood glucose: ? Before every insulin injection. ? Before and after exercise. ? Before meals. ? 2 hours after a meal. ? Occasionally between 2:00 a.m. and 3:00 a.m., as  directed. ? Before potentially dangerous tasks, like driving or using heavy machinery. ? At bedtime.  You may need to check your blood glucose more often, up to 6-10 times a day, if you: ? Use an insulin pump. ? Need multiple daily injections (MDI). ? Have diabetes that is not well-controlled. ? Are ill. ? Have a history of severe hypoglycemia. ? Have hypoglycemia unawareness. If you have type 2 diabetes:  If you take insulin or other diabetes medicines, check your blood glucose 2 or more times a day.  If you are on intensive insulin therapy, check your blood glucose 4 or more times a day. Occasionally, you may also need to check between 2:00 a.m. and 3:00 a.m., as directed.  Also check your blood glucose: ? Before and after exercise. ? Before potentially dangerous tasks, like driving or using heavy machinery.  You may need to check your blood glucose more often if: ? Your medicine is being adjusted. ? Your diabetes is not well-controlled. ? You are ill. General tips  Always keep your supplies with you.  If you have questions or need help, all blood glucose meters have a 24-hour "hotline" phone number that you can call. You may also contact your health care provider.  After you use a few boxes of test strips, adjust (calibrate) your blood glucose meter by following instructions that came with your meter. Contact a health care provider if:  Your blood glucose is at or above 240 mg/dL (13.3 mmol/L) for 2 days in a row.  You have been sick or have had a fever for 2 days or longer, and you are not getting better.  You have any of the following problems for more than 6 hours: ? You cannot eat or drink. ? You have nausea or vomiting. ? You have diarrhea. Get help right away if:  Your blood glucose is lower than 54 mg/dL (3 mmol/L).  You become confused or you have trouble thinking clearly.  You have difficulty breathing.  You have moderate or large ketone levels in your  urine. Summary  Monitoring your blood sugar (glucose) is an important part of managing your diabetes (diabetes mellitus).  Blood glucose monitoring involves checking your blood glucose as often as directed and keeping a record (log) of your results over time.  Your health care provider will set individualized treatment goals for you. Your goals will be based on your age, other medical conditions you have, and how you respond to diabetes treatment.  Every time you check your blood glucose, write down your result. Also write down any notes about things that may be affecting your blood glucose, such as your diet and exercise for the day. This information is not intended to replace advice given to you by your health care provider. Make sure you discuss any questions you have with your health care provider. Document Released:  01/25/2003 Document Revised: 12/03/2016 Document Reviewed: 07/04/2015 Elsevier Interactive Patient Education  [employer].

## 2018-07-24 LAB — CBC WITH DIFFERENTIAL/PLATELET
Absolute Monocytes: 680 cells/uL (ref 200–950)
Basophils Absolute: 50 cells/uL (ref 0–200)
Basophils Relative: 0.6 %
Eosinophils Absolute: 176 cells/uL (ref 15–500)
Eosinophils Relative: 2.1 %
HCT: 39.6 % (ref 35.0–45.0)
Hemoglobin: 13.8 g/dL (ref 11.7–15.5)
Lymphs Abs: 3100 cells/uL (ref 850–3900)
MCH: 31.3 pg (ref 27.0–33.0)
MCHC: 34.8 g/dL (ref 32.0–36.0)
MCV: 89.8 fL (ref 80.0–100.0)
MPV: 9.7 fL (ref 7.5–12.5)
Monocytes Relative: 8.1 %
Neutro Abs: 4393 cells/uL (ref 1500–7800)
Neutrophils Relative %: 52.3 %
Platelets: 267 10*3/uL (ref 140–400)
RBC: 4.41 10*6/uL (ref 3.80–5.10)
RDW: 12.3 % (ref 11.0–15.0)
Total Lymphocyte: 36.9 %
WBC: 8.4 10*3/uL (ref 3.8–10.8)

## 2018-07-24 LAB — COMPLETE METABOLIC PANEL WITH GFR
AG Ratio: 1.7 (calc) (ref 1.0–2.5)
ALT: 10 U/L (ref 6–29)
AST: 10 U/L (ref 10–35)
Albumin: 4.3 g/dL (ref 3.6–5.1)
Alkaline phosphatase (APISO): 74 U/L (ref 37–153)
BUN: 11 mg/dL (ref 7–25)
CO2: 26 mmol/L (ref 20–32)
Calcium: 9.6 mg/dL (ref 8.6–10.4)
Chloride: 107 mmol/L (ref 98–110)
Creat: 0.72 mg/dL (ref 0.50–0.99)
GFR, Est African American: 103 mL/min/{1.73_m2} (ref >=60)
GFR, Est Non African American: 88 mL/min/{1.73_m2} (ref >=60)
Globulin: 2.6 g/dL (calc) (ref 1.9–3.7)
Glucose, Bld: 101 mg/dL — ABNORMAL HIGH (ref 65–99)
Potassium: 4 mmol/L (ref 3.5–5.3)
Sodium: 142 mmol/L (ref 135–146)
Total Bilirubin: 0.4 mg/dL (ref 0.2–1.2)
Total Protein: 6.9 g/dL (ref 6.1–8.1)

## 2018-07-24 LAB — HEMOGLOBIN A1C
Hgb A1c MFr Bld: 6.9 % of total Hgb — ABNORMAL HIGH (ref <5.7)
Mean Plasma Glucose: 151 (calc)
eAG (mmol/L): 8.4 (calc)

## 2018-07-24 LAB — LIPID PANEL
Cholesterol: 200 mg/dL — ABNORMAL HIGH (ref <200)
HDL: 36 mg/dL — ABNORMAL LOW (ref >=50)
LDL Cholesterol (Calc): 127 mg/dL (calc) — ABNORMAL HIGH
Non-HDL Cholesterol (Calc): 164 mg/dL (calc) — ABNORMAL HIGH (ref <130)
Total CHOL/HDL Ratio: 5.6 (calc) — ABNORMAL HIGH (ref <5.0)
Triglycerides: 220 mg/dL — ABNORMAL HIGH (ref <150)

## 2018-07-24 LAB — TSH: TSH: 3.62 mIU/L (ref 0.40–4.50)

## 2018-07-24 LAB — MAGNESIUM: Magnesium: 1.9 mg/dL (ref 1.5–2.5)

## 2018-08-07 ENCOUNTER — Other Ambulatory Visit: Payer: Self-pay | Admitting: Internal Medicine

## 2018-10-17 ENCOUNTER — Other Ambulatory Visit: Payer: Self-pay | Admitting: Adult Health

## 2018-10-22 NOTE — Progress Notes (Signed)
FOLLOW UP  Assessment and Plan:   Hypertension Borderline elevated today; she is on max dose of current dual therapy, prefers to avoid adding agent; would like to work on adding exercise/weight loss Monitor blood pressure at home; patient to call if consistently greater than 130/80 Continue DASH diet.   Reminder to go to the ER if any CP, SOB, nausea, dizziness, severe HA, changes vision/speech, left arm numbness and tingling and jaw pain.  Cholesterol Currently above goal; doing better with compliance with 5 mg daily; Discussed LDL goal <70 Continue low cholesterol diet and exercise.  Check lipid panel.   Diabetes with diabetic chronic kidney disease On metformin 500 mg daily Start daily bASA, recommended patient get diabetic eye exam, have results forwarded  Continue diet and exercise.  Perform daily foot/skin check, notify office of any concerning changes.  Check A1C  Obesity with co morbidities Long discussion about weight loss, diet, and exercise Recommended diet heavy in fruits and veggies and low in animal meats, cheeses, and dairy products, appropriate calorie intake Discussed ideal weight for height, initial goal <190lb Patient will work on increasing exercise, start with 10-15 min three times a week, moderate portions Will follow up in 3 months  Vitamin D Def Below goal at recent check; continue to recommend supplementation for goal of 60-100 Has been taking daily  Check vitamin D level at CPE  Anxiety Well managed at this time Stress management techniques discussed, increase water, good sleep hygiene discussed, increase exercise, and increase veggies.   Tobacco use Discussed risks associated with tobacco use and advised to reduce or quit Patient is ready to do so, feels wellbutrin has helped with tapering, down to 2 cigs a day, set goal to quit by 02/06/2019 Will follow up at the next visit  Continue diet and meds as discussed. Further disposition pending  results of labs. Discussed med's effects and SE's.   Over 30 minutes of exam, counseling, chart review, and critical decision making was performed.   Future Appointments  Date Time Provider [place]  04/27/2019  9:00 AM Liane Comber, NP [facility] None    ----------------------------------------------------------------------------------------------------------------------  HPI 63 y.o. female  presents for 3 month follow up on hypertension, cholesterol, diabetes, obesity, anxiety and vitamin D deficiency.   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   she has a diagnosis of anxiety and  on wellbutrin 150 mg daily and doing well with this, felt she no longer needed zolof and xanax, reports this medication has been excellent fit and she feels much more in control. She is not sleeping well, but she attributes this to not exercising, plans to restart.   she currently continues to smoke 1/10 pack a day, up and down, down from previous 1 pack a day; discussed risks associated with smoking, patient is ready to quit, actively working on cutting down. Wellbutrin has been very helpful.   BMI is Body mass index is 31.64 kg/m., she has been working on diet, exercise is limited due to husband's health at this time, had hip replacement, heart valve, stage 4 kidneys. She has been trying to walk more, taking steps instead of elevater. She reports water intake is excellent, sleeping well most nights. She is using fork over knife app for recipes. She does weigh at home, admits if she eats french  fries or soda and this causes her to rebound on weight.  Wt Readings from Last 3 Encounters:  10/23/18 202 lb (91.6 kg)  07/23/18 200 lb (90.7 kg)  04/22/18 197 lb (89.4 kg)   She admits she has not been checking BPs at home, does have cuff and can  check at work, today their BP is BP: 140/70   She does not workout. She denies chest pain, shortness of breath, dizziness.   She is newly on cholesterol medication (she is newly taking rosuvastatin 5 mg daily) and denies myalgias. Her cholesterol is not at goal. The cholesterol last visit was:   Lab Results  Component Value Date   CHOL 200 (H) 07/23/2018   HDL 36 (L) 07/23/2018   LDLCALC 127 (H) 07/23/2018   TRIG 220 (H) 07/23/2018   CHOLHDL 5.6 (H) 07/23/2018    She has not been working on diet and exercise for T2DM, she is back on metformin 500 mg daily, and denies foot ulcerations, increased appetite, nausea, paresthesia of the feet, polydipsia, polyuria, visual disturbances, vomiting and weight loss. She does not have a glucometer. Last A1C in the office was:  Lab Results  Component Value Date   HGBA1C 6.9 (H) 07/23/2018   Lab Results  Component Value Date   GFRNONAA 88 07/23/2018   Patient is on Vitamin D supplement, 5000 IU and reports has been taking regularly since last visit  Lab Results  Component Value Date   VD25OH 31 04/22/2018        Current Medications:  Current Outpatient Medications on File Prior to Visit  Medication Sig  . amLODipine-benazepril (LOTREL) 10-40 MG capsule TAKE 1 CAPSULE DAILY FOR BP  . aspirin EC 81 MG tablet Take 81 mg by mouth daily.  Marland Kitchen buPROPion (WELLBUTRIN XL) 150 MG 24 hr tablet Take 1 tablet Daily for Mood  . fexofenadine (ALLEGRA) 180 MG tablet Take 180 mg by mouth daily.  . fluticasone (FLONASE) 50 MCG/ACT nasal spray Place 1 spray into both nostrils 2 (two) times daily as needed.  . metFORMIN (GLUCOPHAGE-XR) 500 MG 24 hr tablet TAKE 1 TABLET BY MOUTH DAILY WITH LARGEST MEAL OF THE DAY.  . rosuvastatin (CRESTOR) 5 MG tablet Take 1 tab daily for cholesterol.  . vitamin C (ASCORBIC ACID) 500 MG tablet Take 500 mg by mouth daily.  Marland Kitchen VITAMIN D, CHOLECALCIFEROL, PO Take 5,000 Units by mouth.    No current facility-administered medications  on file prior to visit.      Allergies:  Allergies  Allergen Reactions  . Other     Tylenol #3  . Tylox [Oxycodone-Acetaminophen]      Medical History:  Past Medical History:  Diagnosis Date  . Anemia   . Asthma   . B12 deficiency 04/23/2018  . Depression   . Diabetes mellitus without complication ([place])   . Hyperlipidemia   . Hypertension   . Right bundle branch block 05/2005  . Vitamin D deficiency    Family history- Reviewed and unchanged Social history- Reviewed and unchanged   Review of Systems:  Review of Systems  Constitutional: Negative for malaise/fatigue and weight loss.  HENT: Negative for hearing loss and tinnitus.   Eyes: Negative for blurred vision and double vision.  Respiratory: Negative for cough, shortness of breath and wheezing.   Cardiovascular: Negative for chest pain, palpitations, orthopnea, claudication and leg swelling.  Gastrointestinal: Negative for abdominal pain, blood in stool, constipation, diarrhea, heartburn, melena, nausea and vomiting.  Genitourinary: Negative.   Musculoskeletal:  Negative for joint pain and myalgias.  Skin: Negative for rash.  Neurological: Negative for dizziness, tingling, sensory change, weakness and headaches.  Endo/Heme/Allergies: Negative for polydipsia.  Psychiatric/Behavioral: Negative.   All other systems reviewed and are negative.   Physical Exam: BP 140/70   Pulse 76   Temp (!) 97 F (36.1 C)   Wt 202 lb (91.6 kg)   SpO2 95%   BMI 31.64 kg/m  Wt Readings from Last 3 Encounters:  10/23/18 202 lb (91.6 kg)  07/23/18 200 lb (90.7 kg)  04/22/18 197 lb (89.4 kg)   General Appearance: Well nourished, in no apparent distress. Eyes: PERRLA, EOMs, conjunctiva no swelling or erythema Sinuses: No Frontal/maxillary tenderness ENT/Mouth: Ext aud canals clear, TMs without erythema, bulging. No erythema, swelling, or exudate on post pharynx.  Tonsils not swollen or erythematous. Hearing normal.  Neck: Supple,  thyroid normal.  Respiratory: Respiratory effort normal, BS equal bilaterally without rales, rhonchi, wheezing or stridor.  Cardio: RRR with no MRGs. Brisk peripheral pulses without edema.  Abdomen: Soft, + BS.  Non tender, no guarding, rebound, hernias, masses. Lymphatics: Non tender without lymphadenopathy.  Musculoskeletal: Full ROM, 5/5 strength, Normal gait Skin: Warm, dry without rashes, lesions, ecchymosis.  Neuro: Cranial nerves intact. No cerebellar symptoms.  Psych: Awake and oriented X 3, normal affect, Insight and Judgment appropriate.    Joyce Ribas, NP 4:56 PM [facility]

## 2018-10-23 ENCOUNTER — Ambulatory Visit: Payer: 59 | Admitting: Adult Health

## 2018-10-23 ENCOUNTER — Other Ambulatory Visit: Payer: Self-pay

## 2018-10-23 ENCOUNTER — Encounter: Payer: Self-pay | Admitting: Adult Health

## 2018-10-23 VITALS — BP 140/70 | HR 76 | Temp 97.0°F | Wt 202.0 lb

## 2018-10-23 DIAGNOSIS — E1169 Type 2 diabetes mellitus with other specified complication: Secondary | ICD-10-CM

## 2018-10-23 DIAGNOSIS — F419 Anxiety disorder, unspecified: Secondary | ICD-10-CM | POA: Diagnosis not present

## 2018-10-23 DIAGNOSIS — E559 Vitamin D deficiency, unspecified: Secondary | ICD-10-CM

## 2018-10-23 DIAGNOSIS — E785 Hyperlipidemia, unspecified: Secondary | ICD-10-CM

## 2018-10-23 DIAGNOSIS — Z79899 Other long term (current) drug therapy: Secondary | ICD-10-CM

## 2018-10-23 DIAGNOSIS — I10 Essential (primary) hypertension: Secondary | ICD-10-CM

## 2018-10-23 DIAGNOSIS — E1122 Type 2 diabetes mellitus with diabetic chronic kidney disease: Secondary | ICD-10-CM

## 2018-10-23 DIAGNOSIS — Z720 Tobacco use: Secondary | ICD-10-CM

## 2018-10-23 DIAGNOSIS — N182 Chronic kidney disease, stage 2 (mild): Secondary | ICD-10-CM

## 2018-10-23 DIAGNOSIS — E669 Obesity, unspecified: Secondary | ICD-10-CM

## 2018-10-23 DIAGNOSIS — N181 Chronic kidney disease, stage 1: Secondary | ICD-10-CM

## 2018-10-23 NOTE — Patient Instructions (Addendum)
Goals    . Blood Pressure < 130/80    . Exercise 3x per week (10-15 min per time)    . HEMOGLOBIN A1C < 7.0    . LDL CALC < 70    . Quit Smoking (pt-stated)     Quit by 02/06/2019    . Weight (lb) < 190 lb (86.2 kg)      Start checking blood pressure; goal is <130/80 - call if persistently elevated  Recommend getting your flu vaccine mid October   Can call pharmacist to ask about your medication with metformin recall   Please call your insurance to ask about glucometer   ? Colonoscopy due in Novermber 2020 - call office if you don't hear from them soon  Dr. Havery Moros - [phone]  Schedule diabetic eye - have report forwarded        [place]  [phone] for more information or for a free program for smoking cessation help.   You can call QUIT SMART 1-800-QUIT-NOW for free nicotine patches or replacement therapy- if they are out- keep calling  [facility] Can call for smoking cessation classes, [phone]  If you have a smart phone, please look up Smoke Free app, this will help you stay on track and give you information about money you have saved, life that you have gained back and a ton of more information.     ADVANTAGES OF QUITTING SMOKING  Within 20 minutes, blood pressure decreases. Your pulse is at normal level.  After 8 hours, carbon monoxide levels in the blood return to normal. Your oxygen level increases.  After 24 hours, the chance of having a heart attack starts to decrease. Your breath, hair, and body stop smelling like smoke.  After 48 hours, damaged nerve endings begin to recover. Your sense of taste and smell improve.  After 72 hours, the body is virtually free of nicotine. Your bronchial tubes relax and breathing becomes easier.  After 2 to 12 weeks, lungs can hold more air. Exercise becomes easier and circulation improves.  After 1 year, the risk of coronary heart disease is cut in  half.  After 5 years, the risk of stroke falls to the same as a nonsmoker.  After 10 years, the risk of lung cancer is cut in half and the risk of other cancers decreases significantly.  After 15 years, the risk of coronary heart disease drops, usually to the level of a nonsmoker.  You will have extra money to spend on things other than cigarettes.

## 2018-10-24 LAB — COMPLETE METABOLIC PANEL WITH GFR
AG Ratio: 1.6 (calc) (ref 1.0–2.5)
ALT: 12 U/L (ref 6–29)
AST: 12 U/L (ref 10–35)
Albumin: 4.2 g/dL (ref 3.6–5.1)
Alkaline phosphatase (APISO): 82 U/L (ref 37–153)
BUN: 11 mg/dL (ref 7–25)
CO2: 26 mmol/L (ref 20–32)
Calcium: 9.8 mg/dL (ref 8.6–10.4)
Chloride: 106 mmol/L (ref 98–110)
Creat: 0.76 mg/dL (ref 0.50–0.99)
GFR, Est African American: 96 mL/min/{1.73_m2} (ref >=60)
GFR, Est Non African American: 83 mL/min/{1.73_m2} (ref >=60)
Globulin: 2.7 g/dL (calc) (ref 1.9–3.7)
Glucose, Bld: 101 mg/dL — ABNORMAL HIGH (ref 65–99)
Potassium: 4.1 mmol/L (ref 3.5–5.3)
Sodium: 141 mmol/L (ref 135–146)
Total Bilirubin: 0.4 mg/dL (ref 0.2–1.2)
Total Protein: 6.9 g/dL (ref 6.1–8.1)

## 2018-10-24 LAB — LIPID PANEL
Cholesterol: 147 mg/dL (ref <200)
HDL: 40 mg/dL — ABNORMAL LOW (ref >=50)
LDL Cholesterol (Calc): 75 mg/dL (calc)
Non-HDL Cholesterol (Calc): 107 mg/dL (calc) (ref <130)
Total CHOL/HDL Ratio: 3.7 (calc) (ref <5.0)
Triglycerides: 219 mg/dL — ABNORMAL HIGH (ref <150)

## 2018-10-24 LAB — HEMOGLOBIN A1C
Hgb A1c MFr Bld: 7 % of total Hgb — ABNORMAL HIGH (ref <5.7)
Mean Plasma Glucose: 154 (calc)
eAG (mmol/L): 8.5 (calc)

## 2018-10-24 LAB — CBC WITH DIFFERENTIAL/PLATELET
Absolute Monocytes: 748 cells/uL (ref 200–950)
Basophils Absolute: 42 cells/uL (ref 0–200)
Basophils Relative: 0.5 %
Eosinophils Absolute: 202 cells/uL (ref 15–500)
Eosinophils Relative: 2.4 %
HCT: 39.1 % (ref 35.0–45.0)
Hemoglobin: 13.4 g/dL (ref 11.7–15.5)
Lymphs Abs: 3150 cells/uL (ref 850–3900)
MCH: 31.3 pg (ref 27.0–33.0)
MCHC: 34.3 g/dL (ref 32.0–36.0)
MCV: 91.4 fL (ref 80.0–100.0)
MPV: 10.1 fL (ref 7.5–12.5)
Monocytes Relative: 8.9 %
Neutro Abs: 4259 cells/uL (ref 1500–7800)
Neutrophils Relative %: 50.7 %
Platelets: 251 10*3/uL (ref 140–400)
RBC: 4.28 10*6/uL (ref 3.80–5.10)
RDW: 12.9 % (ref 11.0–15.0)
Total Lymphocyte: 37.5 %
WBC: 8.4 10*3/uL (ref 3.8–10.8)

## 2018-10-24 LAB — TSH: TSH: 4.1 mIU/L (ref 0.40–4.50)

## 2018-10-24 LAB — MAGNESIUM: Magnesium: 2 mg/dL (ref 1.5–2.5)

## 2018-12-08 ENCOUNTER — Encounter: Payer: Self-pay | Admitting: Gastroenterology

## 2018-12-17 ENCOUNTER — Other Ambulatory Visit: Payer: Self-pay | Admitting: Internal Medicine

## 2019-01-20 ENCOUNTER — Other Ambulatory Visit: Payer: Self-pay | Admitting: Physician Assistant

## 2019-01-20 DIAGNOSIS — I10 Essential (primary) hypertension: Secondary | ICD-10-CM

## 2019-01-22 ENCOUNTER — Other Ambulatory Visit: Payer: Self-pay | Admitting: Adult Health

## 2019-01-27 ENCOUNTER — Ambulatory Visit: Payer: 59 | Admitting: Adult Health

## 2019-02-19 ENCOUNTER — Other Ambulatory Visit: Payer: Self-pay | Admitting: Internal Medicine

## 2019-02-19 MED ORDER — ALPRAZOLAM 0.5 MG PO TABS: ORAL_TABLET | ORAL | 0 refills | Status: DC

## 2019-04-17 ENCOUNTER — Other Ambulatory Visit: Payer: Self-pay | Admitting: Adult Health

## 2019-04-23 NOTE — Progress Notes (Signed)
Complete Physical  Assessment and Plan:  Encounter for general adult medical examination with abnormal findings Schedule mammogram and DEXA due to poor follow through, 6 weeks out due to covid 19 vaccine Patient states will schedule colonoscopy; continue close follow up  CT lung cancer screening discussed and ordered Defer vaccine due to recent covid 19 shot  Essential hypertension - continue medications, DASH diet, exercise and monitor at home. Call if greater than 130/80.  -     CBC with Differential/Platelet -     CMP/GFR -     TSH -     Urinalysis, Routine w reflex microscopic -     Microalbumin / creatinine urine ratio -     EKG 12-Lead  T2DM Currently well controlled by low dose metformin Education: Reviewed 'ABCs' of diabetes management (respective goals in parentheses):  A1C (<7), blood pressure (<130/80), and cholesterol (LDL <70) Eye Exam yearly and Dental Exam every 6 months. She will schedule diabetic eye exam with Dr. Delman Cheadle Foot exam completed Dietary recommendations Physical Activity recommendations  CKD stage 2 due to type 2 diabetes mellitus [facility]) Discussed general issues about diabetes pathophysiology and management., Educational material distributed., Suggested low cholesterol diet., Encouraged aerobic exercise., Discussed foot care., Reminded to get yearly retinal exam. -     CMP/GFR -     Microalbumin  Hyperlipidemia, unspecified hyperlipidemia type Try rosuvastatin 5 mg once a week, consider zetia if needed -continue medications, check lipids, decrease fatty foods, increase activity.  -     Lipid panel  Tobacco use Actively cutting back on smoking, wellbutrin helpful, declines chantix -lung cancer screening with low dose CT discussed as recommended by guidelines based on age, number of pack year history.  Discussed risks of screening including but not limited to false positives on xray, further testing or consultation with specialist, and possible false  negative CT as well. Understanding expressed and wishes to proceed with CT testing. Order placed.  -     EKG 12-Lead -     Low dose lung CT  Vitamin D deficiency -     VITAMIN D 25 Hydroxy (Vit-D Deficiency, Fractures)  Anxiety Well managed by current regimen; continue medications Stress management techniques discussed, increase water, good sleep hygiene discussed, increase exercise, and increase veggies.   Family history of AAA [place] screening anticipated soon at welcome to medicare visit, defer today   Encounter for long-term (current) use of medications -     Magnesium  Anemia, unspecified type -     Vitamin B12 -     Iron, TIBC, ferritin   Orders Placed This Encounter  Procedures  . MM Digital Screening  . DG Bone Density  . CT CHEST LUNG CA SCREEN LOW DOSE W/O CM  . CBC with Differential/Platelet  . COMPLETE METABOLIC PANEL WITH GFR  . Magnesium  . Lipid panel  . TSH  . Hemoglobin A1c  . VITAMIN D 25 Hydroxy (Vit-D Deficiency, Fractures)  . Vitamin B12  . Microalbumin / creatinine urine ratio  . Urinalysis, Routine w reflex microscopic  . Iron, TIBC and Ferritin Panel  . EKG 12-Lead  . HM DIABETES FOOT EXAM     Discussed med's effects and SE's. Screening labs and tests as requested with regular follow-up as recommended. Over 40 minutes of exam, counseling, chart review, and complex, high level critical decision making was performed this visit.   Future Appointments  Date Time Provider [place]  04/28/2020  9:00 AM Liane Comber, NP [facility] None  HPI  64 y.o. female  presents for a complete physical and follow up for has Essential hypertension; CKD stage 2 due to type 2 diabetes mellitus ([place]); Major depressive disorder, recurrent episode, in partial remission with anxious distress ([place]); Medication management; Hyperlipidemia associated with type 2 diabetes mellitus ([place]); Tobacco use; Obesity (BMI 30.0-34.9); Vitamin D deficiency; Type 2 diabetes  mellitus ([place]); and Family history of abdominal aortic aneurysm (AAA) on their problem list.   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 Daughter is   she has a diagnosis of depression/anxiety, r/t being primary caregiver for husband, reports mood has been labile since his passing, doing some journaling. On wellbutrin 150 mg daily and doing well with this, very rare use of xanax, typically in the evening. She reports is sleeping fairly at night, improving, denies SE.   she currently continues to smoke 1/10 pack a day, down from previous 1 pack a day; discussed risks associated with smoking, patient is ready to quit, actively working on cutting down. Wellbutrin has been very helpful. She has 30 pack year history, Ct lung discussed.   BMI is Body mass index is 31.79 kg/m., she has been working on diet, She has new home gym, trying to do 2-3 days/week in addition to walking at work.  She has been cutting down on soda, down to 1/2 of tiny can per day.  She does drink 2 cups of coffee daily.  She drinks 60+ fluid ounces of water daily, working on intake.  She estimates 5 servings fruits/veggies.  Wt Readings from Last 3 Encounters:  04/27/19 203 lb (92.1 kg)  10/23/18 202 lb (91.6 kg)  07/23/18 200 lb (90.7 kg)   Her blood pressure has been controlled at work, she checks it occ, today their BP is BP: 128/76   BP Readings from Last 3 Encounters:  04/27/19 128/76  10/23/18 140/70  07/23/18 (!) 142/82   She does workout, walking, running, she will have some SOB with running only, not with inclines, no accompaniments. She denies chest pain,  Dizziness, diaphoresis. She does continue to smoke and likely has COPD but no diagnosis, no PFTs/no CXR; CXR has been ordered but with poor patient follow through.  She is not on cholesterol medication (was on rosuvastatin 5 mg daily, had myalgia, willing to try lower frequecy) and denies  myalgias. Her cholesterol is not at goal. The cholesterol last visit was:   Lab Results  Component Value Date   CHOL 147 10/23/2018   HDL 40 (L) 10/23/2018   LDLCALC 75 10/23/2018   TRIG 219 (H) 10/23/2018   CHOLHDL 3.7 10/23/2018   She has been working on diet and exercise for T2 diabetes (treated by metformin 500 mg daily) , she is on bASA, she is on ACE/ARB, she is on metformin taken off invokana and denies paresthesia of the feet, polydipsia, polyuria and visual disturbances. Last A1C in the office was:  Lab Results  Component Value Date   HGBA1C 7.0 (H) 10/23/2018   Last GFR: Lab Results  Component Value Date   GFRNONAA 83 10/23/2018   Patient is on Vitamin D supplement but admits hasn't been taking.    Lab Results  Component Value Date   VD25OH 31 04/22/2018       Current Medications:  Current Outpatient Medications on File Prior to Visit  Medication Sig Dispense Refill  . ALPRAZolam (XANAX) 0.5 MG tablet Take 1/2 to  1 tablet at Bedtime as needed for Sleep 30 tablet 0  . amLODipine-benazepril (LOTREL) 10-40 MG capsule Take 1 capsule Daily for   BP 90 capsule 3  . aspirin EC 81 MG tablet Take 81 mg by mouth daily.    Marland Kitchen buPROPion (WELLBUTRIN XL) 150 MG 24 hr tablet Take 1 tablet Daily for Mood 90 tablet 3  . fexofenadine (ALLEGRA) 180 MG tablet Take 180 mg by mouth daily.    . fluticasone (FLONASE) 50 MCG/ACT nasal spray Place 1 spray into both nostrils 2 (two) times daily as needed.    . metFORMIN (GLUCOPHAGE-XR) 500 MG 24 hr tablet TAKE 1 TABLET BY MOUTH DAILY WITH LARGEST MEAL OF THE DAY. 30 tablet 5  . vitamin C (ASCORBIC ACID) 500 MG tablet Take 500 mg by mouth daily.    Marland Kitchen VITAMIN D, CHOLECALCIFEROL, PO Take 5,000 Units by mouth.      No current facility-administered medications on file prior to visit.   Allergies:  Allergies  Allergen Reactions  . Other     Tylenol #3  . Tylox [Oxycodone-Acetaminophen]    Medical History:  She has Essential hypertension;  CKD stage 2 due to type 2 diabetes mellitus ([place]); Major depressive disorder, recurrent episode, in partial remission with anxious distress ([place]); Medication management; Hyperlipidemia associated with type 2 diabetes mellitus ([place]); Tobacco use; Obesity (BMI 30.0-34.9); Vitamin D deficiency; Type 2 diabetes mellitus ([place]); and Family history of abdominal aortic aneurysm (AAA) on their problem list.   Health Maintenance:   Immunization History  Administered Date(s) Administered  . Influenza-Unspecified 10/28/2014, 11/06/2018  . PFIZER SARS-COV-2 Vaccination 03/21/2019, 04/15/2019  . Pneumococcal Polysaccharide-23 03/19/2016  . Td 10/07/2003  . Tdap 03/19/2016   Tetanus: 2018 Pneumovax: 2018 Prevnar 13:  Defer due to recent covid 19 shot Flu vaccine: 2020 at work  Zostavax: N/A Covid 64 2/2 2021  LMP s/p hysterectomy Pap: 2008 before hysterectomy, DONE MGM: 01/2017 - will schedule for patient due to poor follow through  DEXA: DUE - ordered Colonoscopy:12/2015 DUE 12/2018 - patient has number to schedule  [place] vaginal 2008 CXR 2008 -  Last Dental Exam: Pincus Badder, last visit 03/2018 Last Eye Exam: glasses, [employer], Sees Dr. Delman Cheadle for diabetic eye exam, overdue     Patient Care Team: Unk Pinto, MD as PCP - General (Internal Medicine) Armbruster, Carlota Raspberry, MD as Consulting Physician (Gastroenterology)  Surgical History:  She has a past surgical history that includes Cesarean section; Abdominal hysterectomy; Cholecystectomy; ovarian cystectomy (1985); and Tonsillectomy. Family History:  Herfamily history includes AAA (abdominal aortic aneurysm) in her maternal grandfather; Asthma in her daughter; Breast cancer (age of onset: 104) in her mother; Congestive Heart Failure in her father; Diabetes in her brother and mother; Heart attack (age of onset: 12) in her father; Heart disease in her father and mother; Hypertension in her father; Multiple sclerosis in her brother;  Prostate cancer (age of onset: 11) in her father. Social History:  She reports that she has been smoking cigarettes. She started smoking about 41 years ago. She has a 30.75 pack-year smoking history. She has never used smokeless tobacco. She reports that she does not drink alcohol or use drugs.  Review of Systems: Review of Systems  Constitutional: Negative.  Negative for weight loss.  HENT: Negative for congestion, ear discharge, ear pain, hearing loss, nosebleeds, sinus pain, sore throat and tinnitus.   Eyes: Negative.   Respiratory: Negative for cough, hemoptysis, sputum production, wheezing and stridor.   Cardiovascular: Negative.  Gastrointestinal: Negative for abdominal pain, blood in stool, constipation, diarrhea, heartburn, melena, nausea and vomiting.  Genitourinary: Negative.   Musculoskeletal: Negative for back pain, falls, joint pain, myalgias and neck pain.  Skin: Negative.   Neurological: Negative for dizziness, tingling, tremors, sensory change, speech change, focal weakness, seizures, loss of consciousness and headaches.  Psychiatric/Behavioral: Negative.     Physical Exam: Estimated body mass index is 31.79 kg/m as calculated from the following:   Height as of this encounter: 5\' 7"  (1.702 m).   Weight as of this encounter: 203 lb (92.1 kg). BP 128/76   Pulse 89   Temp (!) 97.5 F (36.4 C)   Ht 5\' 7"  (1.702 m)   Wt 203 lb (92.1 kg)   SpO2 97%   BMI 31.79 kg/m  General Appearance: Well nourished, in no apparent distress.  Eyes: PERRLA, EOMs, conjunctiva no swelling or erythema, normal fundi and vessels.  Sinuses: No Frontal/maxillary tenderness  ENT/Mouth: Ext aud canals clear, normal light reflex with TMs without erythema, bulging. Good dentition. No erythema, swelling, or exudate on post pharynx. Tonsils not swollen or erythematous. Hearing normal.  Neck: Supple, thyroid normal. No bruits  Respiratory: Respiratory effort normal, BS equal bilaterally without  rales, rhonchi, wheezing or stridor.  Cardio: RRR without murmurs, rubs or gallops. Brisk peripheral pulses without edema.  Chest: symmetric, with normal excursions and percussion.  Breasts: Declines exam today  Abdomen: Soft, nontender, no guarding, rebound, hernias, masses, or organomegaly.  Lymphatics: Non tender without lymphadenopathy.  Genitourinary: Defer Musculoskeletal: Full ROM all peripheral extremities,5/5 strength, and normal gait Skin: Warm, dry without rashes, lesions, ecchymosis. Neuro: Cranial nerves intact, reflexes equal bilaterally. Normal muscle tone, no cerebellar symptoms. Sensation intact to monofilament Psych: Awake and oriented X 3, normal affect, Insight and Judgment appropriate.   EKG: WNL, IRBBB no ST changes. AORTA SCAN: defer  Izora Ribas 10:11 AM [facility]

## 2019-04-27 ENCOUNTER — Encounter: Payer: Self-pay | Admitting: Adult Health

## 2019-04-27 ENCOUNTER — Other Ambulatory Visit: Payer: Self-pay

## 2019-04-27 ENCOUNTER — Ambulatory Visit: Payer: 59 | Admitting: Adult Health

## 2019-04-27 VITALS — BP 128/76 | HR 89 | Temp 97.5°F | Ht 67.0 in | Wt 203.0 lb

## 2019-04-27 DIAGNOSIS — Z8249 Family history of ischemic heart disease and other diseases of the circulatory system: Secondary | ICD-10-CM

## 2019-04-27 DIAGNOSIS — E785 Hyperlipidemia, unspecified: Secondary | ICD-10-CM

## 2019-04-27 DIAGNOSIS — E559 Vitamin D deficiency, unspecified: Secondary | ICD-10-CM

## 2019-04-27 DIAGNOSIS — I10 Essential (primary) hypertension: Secondary | ICD-10-CM

## 2019-04-27 DIAGNOSIS — Z136 Encounter for screening for cardiovascular disorders: Secondary | ICD-10-CM

## 2019-04-27 DIAGNOSIS — E538 Deficiency of other specified B group vitamins: Secondary | ICD-10-CM

## 2019-04-27 DIAGNOSIS — N182 Chronic kidney disease, stage 2 (mild): Secondary | ICD-10-CM

## 2019-04-27 DIAGNOSIS — E2839 Other primary ovarian failure: Secondary | ICD-10-CM

## 2019-04-27 DIAGNOSIS — Z79899 Other long term (current) drug therapy: Secondary | ICD-10-CM

## 2019-04-27 DIAGNOSIS — Z0000 Encounter for general adult medical examination without abnormal findings: Secondary | ICD-10-CM

## 2019-04-27 DIAGNOSIS — Z122 Encounter for screening for malignant neoplasm of respiratory organs: Secondary | ICD-10-CM

## 2019-04-27 DIAGNOSIS — Z1231 Encounter for screening mammogram for malignant neoplasm of breast: Secondary | ICD-10-CM

## 2019-04-27 DIAGNOSIS — E1169 Type 2 diabetes mellitus with other specified complication: Secondary | ICD-10-CM

## 2019-04-27 DIAGNOSIS — E669 Obesity, unspecified: Secondary | ICD-10-CM

## 2019-04-27 DIAGNOSIS — Z720 Tobacco use: Secondary | ICD-10-CM

## 2019-04-27 DIAGNOSIS — F3341 Major depressive disorder, recurrent, in partial remission: Secondary | ICD-10-CM

## 2019-04-27 DIAGNOSIS — Z13820 Encounter for screening for osteoporosis: Secondary | ICD-10-CM

## 2019-04-27 DIAGNOSIS — E1122 Type 2 diabetes mellitus with diabetic chronic kidney disease: Secondary | ICD-10-CM

## 2019-04-27 DIAGNOSIS — Z0001 Encounter for general adult medical examination with abnormal findings: Secondary | ICD-10-CM

## 2019-04-27 MED ORDER — ROSUVASTATIN CALCIUM 5 MG PO TABS: ORAL_TABLET | ORAL | 3 refills | Status: DC

## 2019-04-27 NOTE — Patient Instructions (Addendum)
Joyce Shannon , Thank you for taking time to come for your Annual Wellness Visit. I appreciate your ongoing commitment to your health goals. Please review the following plan we discussed and let me know if I can assist you in the future.   These are the goals we discussed: Goals    . Blood Pressure < 130/80    . Exercise 3x per week (10-15 min per time)    . HEMOGLOBIN A1C < 7.0    . LDL CALC < 70    . Quit Smoking (pt-stated)     Quit by 02/06/2019    . Weight (lb) < 190 lb (86.2 kg)       This is a list of the screening recommended for you and due dates:  Health Maintenance  Topic Date Due  . Eye exam for diabetics  Never done  . Flu Shot  09/06/2018  . Colon Cancer Screening  12/07/2018  . DEXA scan (bone density measurement)  Never done  . Pneumonia vaccines (1 of 2 - PCV13) 01/03/2019  . Mammogram  01/09/2019  . Complete foot exam   04/22/2019  . Hemoglobin A1C  04/22/2019  . Tetanus Vaccine  03/19/2026  .  Hepatitis C: One time screening is recommended by Center for Disease Control  (CDC) for  adults born from [birth year] through 1967.   Completed  . HIV Screening  Completed  . Pap Smear  Discontinued     Shingrix - please call insurance to see if they cover this vaccine - if they do can get at [facility]   Please call to set up your colonoscopy   Please schedule diabetic eye exam   Mammogram with bone density in 6+ weeks   Know what a healthy weight is for you (roughly BMI <25) and aim to maintain this  Aim for 7+ servings of fruits and vegetables daily  65-80+ fluid ounces of water or unsweet tea for healthy kidneys  Limit to max 1 drink of alcohol per day; avoid smoking/tobacco  Limit animal fats in diet for cholesterol and heart health - choose grass fed whenever available  Avoid highly processed foods, and foods high in saturated/trans fats  Aim for low stress - take time to unwind and care for your mental health  Aim for 150 min of moderate intensity exercise weekly  for heart health, and weights twice weekly for bone health  Aim for 7-9 hours of sleep daily       When it comes to diets, agreement about the perfect plan isn't easy to find, even among the experts. Experts at the [employer] developed an idea known as the Healthy Eating Plate. Just imagine a plate divided into logical, healthy portions.  The emphasis is on diet quality:  Load up on vegetables and fruits - one-half of your plate: Aim for color and variety, and remember that potatoes don't count.  Go for whole grains - one-quarter of your plate: Whole wheat, barley, wheat berries, quinoa, oats, brown rice, and foods made with them. If you want pasta, go with whole wheat pasta.  Protein power - one-quarter of your plate: Fish, chicken, beans, and nuts are all healthy, versatile protein sources. Limit red meat.  The diet, however, does go beyond the plate, offering a few other suggestions.  Use healthy plant oils, such as olive, canola, soy, corn, sunflower and peanut. Check the labels, and avoid partially hydrogenated oil, which have unhealthy trans fats.  If you're thirsty,  drink water. Coffee and tea are good in moderation, but skip sugary drinks and limit milk and dairy products to one or two daily servings.  The type of carbohydrate in the diet is more important than the amount. Some sources of carbohydrates, such as vegetables, fruits, whole grains, and beans--are healthier than others.  Finally, stay active.

## 2019-04-28 ENCOUNTER — Other Ambulatory Visit: Payer: Self-pay | Admitting: Adult Health

## 2019-04-28 DIAGNOSIS — N289 Disorder of kidney and ureter, unspecified: Secondary | ICD-10-CM

## 2019-04-28 LAB — CBC WITH DIFFERENTIAL/PLATELET
Absolute Monocytes: 877 cells/uL (ref 200–950)
Basophils Absolute: 43 cells/uL (ref 0–200)
Basophils Relative: 0.4 %
Eosinophils Absolute: 193 cells/uL (ref 15–500)
Eosinophils Relative: 1.8 %
HCT: 43 % (ref 35.0–45.0)
Hemoglobin: 14.3 g/dL (ref 11.7–15.5)
Lymphs Abs: 3317 cells/uL (ref 850–3900)
MCH: 30.6 pg (ref 27.0–33.0)
MCHC: 33.3 g/dL (ref 32.0–36.0)
MCV: 92.1 fL (ref 80.0–100.0)
MPV: 9.6 fL (ref 7.5–12.5)
Monocytes Relative: 8.2 %
Neutro Abs: 6270 cells/uL (ref 1500–7800)
Neutrophils Relative %: 58.6 %
Platelets: 259 10*3/uL (ref 140–400)
RBC: 4.67 10*6/uL (ref 3.80–5.10)
RDW: 12.6 % (ref 11.0–15.0)
Total Lymphocyte: 31 %
WBC: 10.7 10*3/uL (ref 3.8–10.8)

## 2019-04-28 LAB — COMPLETE METABOLIC PANEL WITH GFR
AG Ratio: 1.4 (calc) (ref 1.0–2.5)
ALT: 15 U/L (ref 6–29)
AST: 19 U/L (ref 10–35)
Albumin: 4.2 g/dL (ref 3.6–5.1)
Alkaline phosphatase (APISO): 70 U/L (ref 37–153)
BUN: 14 mg/dL (ref 7–25)
CO2: 28 mmol/L (ref 20–32)
Calcium: 9.7 mg/dL (ref 8.6–10.4)
Chloride: 104 mmol/L (ref 98–110)
Creat: 0.94 mg/dL (ref 0.50–0.99)
GFR, Est African American: 74 mL/min/{1.73_m2} (ref >=60)
GFR, Est Non African American: 64 mL/min/{1.73_m2} (ref >=60)
Globulin: 2.9 g/dL (calc) (ref 1.9–3.7)
Glucose, Bld: 116 mg/dL — ABNORMAL HIGH (ref 65–99)
Potassium: 4.6 mmol/L (ref 3.5–5.3)
Sodium: 142 mmol/L (ref 135–146)
Total Bilirubin: 0.6 mg/dL (ref 0.2–1.2)
Total Protein: 7.1 g/dL (ref 6.1–8.1)

## 2019-04-28 LAB — URINALYSIS, ROUTINE W REFLEX MICROSCOPIC
Bilirubin Urine: NEGATIVE
Hgb urine dipstick: NEGATIVE
Ketones, ur: NEGATIVE
Leukocytes,Ua: NEGATIVE
Nitrite: NEGATIVE
Protein, ur: NEGATIVE
Specific Gravity, Urine: 1.021 (ref 1.001–1.03)
pH: <=5 (ref 5.0–8.0)

## 2019-04-28 LAB — HEMOGLOBIN A1C
Hgb A1c MFr Bld: 7.5 % of total Hgb — ABNORMAL HIGH (ref <5.7)
Mean Plasma Glucose: 169 (calc)
eAG (mmol/L): 9.3 (calc)

## 2019-04-28 LAB — MAGNESIUM: Magnesium: 2.1 mg/dL (ref 1.5–2.5)

## 2019-04-28 LAB — MICROALBUMIN / CREATININE URINE RATIO
Creatinine, Urine: 157 mg/dL (ref 20–275)
Microalb Creat Ratio: 15 mcg/mg creat (ref <30)
Microalb, Ur: 2.3 mg/dL

## 2019-04-28 LAB — IRON,TIBC AND FERRITIN PANEL
%SAT: 36 % (calc) (ref 16–45)
Ferritin: 78 ng/mL (ref 16–288)
Iron: 113 ug/dL (ref 45–160)
TIBC: 311 mcg/dL (calc) (ref 250–450)

## 2019-04-28 LAB — LIPID PANEL
Cholesterol: 199 mg/dL (ref <200)
HDL: 43 mg/dL — ABNORMAL LOW (ref >=50)
LDL Cholesterol (Calc): 119 mg/dL (calc) — ABNORMAL HIGH
Non-HDL Cholesterol (Calc): 156 mg/dL (calc) — ABNORMAL HIGH (ref <130)
Total CHOL/HDL Ratio: 4.6 (calc) (ref <5.0)
Triglycerides: 242 mg/dL — ABNORMAL HIGH (ref <150)

## 2019-04-28 LAB — VITAMIN D 25 HYDROXY (VIT D DEFICIENCY, FRACTURES): Vit D, 25-Hydroxy: 21 ng/mL — ABNORMAL LOW (ref 30–100)

## 2019-04-28 LAB — VITAMIN B12: Vitamin B-12: 311 pg/mL (ref 200–1100)

## 2019-04-28 LAB — TSH: TSH: 3.56 mIU/L (ref 0.40–4.50)

## 2019-04-28 MED ORDER — METFORMIN HCL ER 500 MG PO TB24: ORAL_TABLET | ORAL | 3 refills | Status: DC

## 2019-04-28 MED ORDER — EZETIMIBE 10 MG PO TABS: 10.0000 mg | ORAL_TABLET | Freq: Every day | ORAL | 1 refills | Status: DC

## 2019-05-26 ENCOUNTER — Ambulatory Visit: Payer: 59

## 2019-05-29 ENCOUNTER — Other Ambulatory Visit: Payer: Self-pay

## 2019-05-29 ENCOUNTER — Other Ambulatory Visit: Payer: 59

## 2019-05-29 DIAGNOSIS — N289 Disorder of kidney and ureter, unspecified: Secondary | ICD-10-CM

## 2019-05-30 LAB — BASIC METABOLIC PANEL WITH GFR
BUN: 12 mg/dL (ref 7–25)
CO2: 26 mmol/L (ref 20–32)
Calcium: 9.5 mg/dL (ref 8.6–10.4)
Chloride: 105 mmol/L (ref 98–110)
Creat: 0.9 mg/dL (ref 0.50–0.99)
GFR, Est African American: 78 mL/min/{1.73_m2} (ref >=60)
GFR, Est Non African American: 67 mL/min/{1.73_m2} (ref >=60)
Glucose, Bld: 137 mg/dL — ABNORMAL HIGH (ref 65–99)
Potassium: 4.1 mmol/L (ref 3.5–5.3)
Sodium: 139 mmol/L (ref 135–146)

## 2019-07-01 LAB — HM DIABETES EYE EXAM

## 2019-07-03 ENCOUNTER — Encounter: Payer: Self-pay | Admitting: *Deleted

## 2019-07-17 ENCOUNTER — Ambulatory Visit: Payer: 59

## 2019-07-17 ENCOUNTER — Other Ambulatory Visit: Payer: 59

## 2019-07-20 ENCOUNTER — Other Ambulatory Visit: Payer: Self-pay

## 2019-07-20 ENCOUNTER — Ambulatory Visit
Admission: RE | Admit: 2019-07-20 | Discharge: 2019-07-20 | Disposition: A | Discharge status: Home / Self Care | Payer: 59 | Source: Ambulatory Visit | Attending: Adult Health | Admitting: Adult Health

## 2019-07-20 DIAGNOSIS — Z720 Tobacco use: Secondary | ICD-10-CM

## 2019-07-20 DIAGNOSIS — Z122 Encounter for screening for malignant neoplasm of respiratory organs: Secondary | ICD-10-CM

## 2019-07-21 ENCOUNTER — Encounter: Payer: Self-pay | Admitting: Adult Health

## 2019-07-21 DIAGNOSIS — J439 Emphysema, unspecified: Secondary | ICD-10-CM | POA: Insufficient documentation

## 2019-07-21 DIAGNOSIS — I700 Atherosclerosis of aorta: Secondary | ICD-10-CM | POA: Insufficient documentation

## 2019-08-20 ENCOUNTER — Encounter: Payer: Self-pay | Admitting: Adult Health

## 2019-08-20 ENCOUNTER — Other Ambulatory Visit: Payer: Self-pay

## 2019-08-20 ENCOUNTER — Ambulatory Visit: Payer: 59 | Admitting: Adult Health

## 2019-08-20 VITALS — BP 130/80 | HR 76 | Temp 95.5°F | Ht 67.0 in | Wt 200.0 lb

## 2019-08-20 DIAGNOSIS — J439 Emphysema, unspecified: Secondary | ICD-10-CM | POA: Diagnosis not present

## 2019-08-20 DIAGNOSIS — I700 Atherosclerosis of aorta: Secondary | ICD-10-CM

## 2019-08-20 DIAGNOSIS — R350 Frequency of micturition: Secondary | ICD-10-CM

## 2019-08-20 DIAGNOSIS — E669 Obesity, unspecified: Secondary | ICD-10-CM

## 2019-08-20 DIAGNOSIS — E1122 Type 2 diabetes mellitus with diabetic chronic kidney disease: Secondary | ICD-10-CM | POA: Diagnosis not present

## 2019-08-20 DIAGNOSIS — Z720 Tobacco use: Secondary | ICD-10-CM | POA: Diagnosis not present

## 2019-08-20 DIAGNOSIS — Z23 Encounter for immunization: Secondary | ICD-10-CM | POA: Diagnosis not present

## 2019-08-20 DIAGNOSIS — N181 Chronic kidney disease, stage 1: Secondary | ICD-10-CM

## 2019-08-20 DIAGNOSIS — R6889 Other general symptoms and signs: Secondary | ICD-10-CM

## 2019-08-20 DIAGNOSIS — Z8249 Family history of ischemic heart disease and other diseases of the circulatory system: Secondary | ICD-10-CM

## 2019-08-20 DIAGNOSIS — N182 Chronic kidney disease, stage 2 (mild): Secondary | ICD-10-CM

## 2019-08-20 DIAGNOSIS — Z0001 Encounter for general adult medical examination with abnormal findings: Secondary | ICD-10-CM | POA: Diagnosis not present

## 2019-08-20 DIAGNOSIS — I10 Essential (primary) hypertension: Secondary | ICD-10-CM

## 2019-08-20 DIAGNOSIS — Z136 Encounter for screening for cardiovascular disorders: Secondary | ICD-10-CM | POA: Diagnosis not present

## 2019-08-20 DIAGNOSIS — Z79899 Other long term (current) drug therapy: Secondary | ICD-10-CM

## 2019-08-20 DIAGNOSIS — E559 Vitamin D deficiency, unspecified: Secondary | ICD-10-CM

## 2019-08-20 DIAGNOSIS — Z0000 Encounter for general adult medical examination without abnormal findings: Secondary | ICD-10-CM

## 2019-08-20 DIAGNOSIS — E1169 Type 2 diabetes mellitus with other specified complication: Secondary | ICD-10-CM | POA: Diagnosis not present

## 2019-08-20 DIAGNOSIS — E785 Hyperlipidemia, unspecified: Secondary | ICD-10-CM

## 2019-08-20 DIAGNOSIS — F3341 Major depressive disorder, recurrent, in partial remission: Secondary | ICD-10-CM

## 2019-08-20 MED ORDER — NITROFURANTOIN MONOHYD MACRO 100 MG PO CAPS: 100.0000 mg | ORAL_CAPSULE | Freq: Two times a day (BID) | ORAL | 0 refills | Status: DC

## 2019-08-20 MED ORDER — ONDANSETRON HCL 4 MG PO TABS: 4.0000 mg | ORAL_TABLET | Freq: Three times a day (TID) | ORAL | 0 refills | Status: DC | PRN

## 2019-08-20 NOTE — Patient Instructions (Addendum)
  Ms. Labine , Thank you for taking time to come for your Medicare Wellness Visit. I appreciate your ongoing commitment to your health goals. Please review the following plan we discussed and let me know if I can assist you in the future.   These are the goals we discussed: Goals    .  Blood Pressure < 130/80    .  Exercise 3x per week (10-15 min per time)    .  HEMOGLOBIN A1C < 7.0    .  LDL CALC < 70    .  Quit Smoking (pt-stated)    .  Weight (lb) < 190 lb (86.2 kg)       This is a list of the screening recommended for you and due dates:  Health Maintenance  Topic Date Due  . Colon Cancer Screening  12/07/2018  . DEXA scan (bone density measurement)  Never done  . Pneumonia vaccines (1 of 2 - PCV13) 01/03/2019  . Mammogram  01/09/2019  . Flu Shot  09/06/2019  . Hemoglobin A1C  10/28/2019  . Complete foot exam   04/26/2020  . Eye exam for diabetics  06/30/2020  . Tetanus Vaccine  03/19/2026  . COVID-19 Vaccine  Completed  .  Hepatitis C: One time screening is recommended by Center for Disease Control  (CDC) for  adults born from [birth year] through 1967.   Completed  . HIV Screening  Completed  . Pap Smear  Discontinued

## 2019-08-20 NOTE — Progress Notes (Signed)
MEDICARE ANNUAL WELLNESS VISIT AND FOLLOW UP  Assessment:    Welcome to Medicare  AAA [place], 13 valent pneumonia vaccine administered today Scheduled for mammogram, DEXA Has # to schedule colonoscopy   Aortic atherosclerosis Per CT 07/2019 Control blood pressure, cholesterol, glucose, increase exercise.   Essential hypertension - continue medications, DASH diet, exercise and monitor at home. Call if greater than 130/80.  -     CBC with Differential/Platelet -     CMP/GFR -     TSH  T2DM Currently fairly controlled by low dose metformin Education: Reviewed 'ABCs' of diabetes management (respective goals in parentheses): A1C (<7), blood pressure (<130/80), and cholesterol (LDL <70) Eye Exam yearly and Dental Exam every 6 months. She will schedule diabetic eye exam with Dr. Delman Cheadle Foot exam completed Dietary recommendations Physical Activity recommendations  CKD stage 2 due to type 2 diabetes mellitus [facility]) Discussed general issues about diabetes pathophysiology and management., Educational material distributed., Suggested low cholesterol diet., Encouraged aerobic exercise., Discussed foot care., Reminded to get yearly retinal exam. -     CMP/GFR -     Microalbumin  Hyperlipidemia, unspecified hyperlipidemia type On rosuvastatin 5 mg BID, zetia 10 mg daily, consider nexlizet if needed LDL goal <70 -continue medications, check lipids, decrease fatty foods, increase activity.  -     Lipid panel  Tobacco use Actively cutting back on smoking, wellbutrin helpful, declines chantix Continue annual low dose screening CT  Emphysema ([place]) Per imaging; STOP SMOKING Denies notable sx, monitoring  Vitamin D deficiency -     VITAMIN D 25 Hydroxy (Vit-D Deficiency, Fractures)  Anxiety Well managed by current regimen; continue medications Stress management techniques discussed, increase water, good sleep hygiene discussed, increase exercise, and increase veggies.   Family  history of AAA [place] screening today negative, recent chest CT negative for aortic dilation  Encounter for long-term (current) use of medications -     Magnesium  Over 40 minutes of exam, counseling, chart review and critical decision making was performed Future Appointments  Date Time Provider [place]  08/31/2019  7:10 AM [facility]G MM 3 [facility]-BCGMM [facility]  08/31/2019  7:30 AM GI-[facility]G DX DEXA 1 GI-BCGDG [facility]  11/25/2019  3:30 PM Liane Comber, NP [facility] None  02/29/2020  4:30 PM Unk Pinto, MD [facility] None  04/28/2020  9:00 AM Liane Comber, NP [facility] None  09/01/2020  3:00 PM Liane Comber, NP [facility] None     Plan:   During the course of the visit the patient was educated and counseled about appropriate screening and preventive services including:    Pneumococcal vaccine   Prevnar 13  Influenza vaccine  Td vaccine  Screening electrocardiogram  Bone densitometry screening  Colorectal cancer screening  Diabetes screening  Glaucoma screening  Nutrition counseling   Advanced directives: requested   Subjective:  Joyce Shannon is a 64 y.o. female who presents for Medicare Annual Wellness Visit and 3 month follow up.   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   She reports 2-3 days of urinary urgency, frequency, dysuria. Endorses urine odor, denies cloudy appearance or hematuria. Denies fever/chills, flank pain. Endorse some suprapubic fullness.   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 On wellbutrin 150 mg daily and doing well with this, very rare use of xanax, typically in the evening. She  reports is sleeping fairly at night, improving, denies SE.   she currently continues to smoke <0.5 pack a day, down from previous 1 pack a day; discussed risks  associated with smoking, patient is ready to quit, actively working on cutting down. Wellbutrin has been very helpful. She has 30 pack year history, Ct lung 07/20/2019 which showed emphysema.   BMI is Body mass index is 31.32 kg/m., she has been working on diet and exercise, walking 30 min daily.  Wt Readings from Last 3 Encounters:  08/20/19 200 lb (90.7 kg)  04/27/19 203 lb (92.1 kg)  10/23/18 202 lb (91.6 kg)   Aortic atherosclerosis per chest CT 07/2019  Her blood pressure has been controlled at home, today their BP is BP: 130/80 She does workout. She denies chest pain, shortness of breath, dizziness.   She is on cholesterol medication (was on rosuvastatin 5 mg daily, had myalgia, now taking twice a week, on zeita 10 mg) and denies myalgias. Her cholesterol is not at goal. The cholesterol last visit was:   Lab Results  Component Value Date   CHOL 199 04/27/2019   HDL 43 (L) 04/27/2019   LDLCALC 119 (H) 04/27/2019   TRIG 242 (H) 04/27/2019   CHOLHDL 4.6 04/27/2019    She has been working on diet and exercise for T2 diabetes (on metformin 500 mg daily with largest meal), and denies increased appetite, nausea, paresthesia of the feet, polydipsia and polyuria. Last A1C in the office was:  Lab Results  Component Value Date   HGBA1C 7.5 (H) 04/27/2019   She has CKD II associated with T2DM monitored at this office. Last GFR: Lab Results  Component Value Date   GFRNONAA 67 05/29/2019   Patient is on Vitamin D supplement, was off at last visit, reports has been taking ? 1000 IU daily  Lab Results  Component Value Date   VD25OH 21 (L) 04/27/2019      Medication Review: Current Outpatient Medications on File Prior to Visit  Medication Sig Dispense Refill  . ALPRAZolam (XANAX) 0.5 MG tablet Take 1/2 to 1 tablet at Bedtime as needed for Sleep 30 tablet 0  . amLODipine-benazepril (LOTREL) 10-40 MG capsule Take 1 capsule Daily for   BP 90 capsule 3  . aspirin EC 81 MG tablet Take 81 mg  by mouth daily.    Marland Kitchen buPROPion (WELLBUTRIN XL) 150 MG 24 hr tablet Take 1 tablet Daily for Mood 90 tablet 3  . ezetimibe (ZETIA) 10 MG tablet Take 1 tablet (10 mg total) by mouth daily. 90 tablet 1  . fexofenadine (ALLEGRA) 180 MG tablet Take 180 mg by mouth daily.    . fluticasone (FLONASE) 50 MCG/ACT nasal spray Place 1 spray into both nostrils 2 (two) times daily as needed.    . metFORMIN (GLUCOPHAGE-XR) 500 MG 24 hr tablet Take 1 tab with each meal - 2-3 tabs/day for diabetes. 90 tablet 3  . rosuvastatin (CRESTOR) 5 MG tablet Take 1 tab once to twice a week as tolerated for cholesterol. 30 tablet 3  . vitamin C (ASCORBIC ACID) 500 MG tablet Take 500 mg by mouth daily.    Marland Kitchen VITAMIN D, CHOLECALCIFEROL, PO Take 5,000 Units by mouth.      No current facility-administered medications on file prior to visit.    Allergies  Allergen Reactions  . Other     Tylenol #3  . Tylox [Oxycodone-Acetaminophen]     Current Problems (verified) Patient Active Problem List   Diagnosis Date  Noted  . Aortic arch atherosclerosis ([place]) 07/21/2019  . Emphysema of lung ([place]) 07/21/2019  . Family history of abdominal aortic aneurysm (AAA) 04/22/2018  . Type 2 diabetes mellitus ([place]) 10/04/2017  . Obesity (BMI 30.0-34.9) 06/21/2014  . Vitamin D deficiency 06/21/2014  . CKD stage 2 due to type 2 diabetes mellitus ([place]) 01/07/2014  . Major depressive disorder, recurrent episode, in partial remission with anxious distress ([place]) 01/07/2014  . Medication management 01/07/2014  . Hyperlipidemia associated with type 2 diabetes mellitus ([place]) 01/07/2014  . Tobacco use 01/07/2014  . Essential hypertension 01/22/2012    Screening Tests Immunization History  Administered Date(s) Administered  . Influenza-Unspecified 10/28/2014, 11/06/2018  . PFIZER SARS-COV-2 Vaccination 03/21/2019, 04/15/2019  . Pneumococcal Conjugate-13 08/20/2019  . Pneumococcal Polysaccharide-23 03/19/2016  . Td 10/07/2003  . Tdap  03/19/2016   Tetanus: 2018 Pneumovax: 2018 Prevnar 13:  DUE  Flu vaccine: 2020 at work  Zostavax: N/A Covid 69 2/2 2021, pfizer  LMP s/p hysterectomy Pap: 2008 before hysterectomy, DONE MGM: 01/2017 - has scheduled 08/31/2019 DEXA: DUE - scheduled for 08/31/2019 Colonoscopy:12/2015 DUE 12/2018 - patient has number to schedule  [place] vaginal 2008 CXR 2008 -  Names of Other Physician/Practitioners you currently use: 1. [facility] here for primary care 2. [facility], eye doctor, last visit 07/01/2019 abstracted  3. Dr. Gilford Rile, dentist, last visit 2021  Patient Care Team: Unk Pinto, MD as PCP - General (Internal Medicine) Armbruster, Carlota Raspberry, MD as Consulting Physician (Gastroenterology)  SURGICAL HISTORY She  has a past surgical history that includes Cesarean section; Abdominal hysterectomy; Cholecystectomy; ovarian cystectomy (1985); and Tonsillectomy. FAMILY HISTORY Her family history includes AAA (abdominal aortic aneurysm) in her maternal grandfather; Asthma in her daughter; Breast cancer (age of onset: 4) in her mother; Congestive Heart Failure in her father; Diabetes in her brother and mother; Heart attack (age of onset: 18) in her father; Heart disease in her father and mother; Hypertension in her father; Multiple sclerosis in her brother; Prostate cancer (age of onset: 31) in her father. SOCIAL HISTORY She  reports that she has been smoking cigarettes. She started smoking about 41 years ago. She has a 30.75 pack-year smoking history. She has never used smokeless tobacco. She reports that she does not drink alcohol and does not use drugs.   MEDICARE WELLNESS OBJECTIVES: Physical activity: Current Exercise Habits: Home exercise routine, Type of exercise: walking, Time (Minutes): 30, Frequency (Times/Week): 7, Weekly Exercise (Minutes/Week): 210, Intensity: Mild, Exercise limited by: None identified Cardiac risk factors: Cardiac Risk  Factors include: advanced age (>12men, >4 women);hypertension;dyslipidemia;diabetes mellitus;smoking/ tobacco exposure;obesity (BMI >30kg/m2) Depression/mood screen:   Depression screen [facility] 2/9 08/20/2019  Decreased Interest 0  Down, Depressed, Hopeless 1  PHQ - 2 Score 1  Altered sleeping 0  Tired, decreased energy 0  Change in appetite 0  Feeling bad or failure about yourself  0  Trouble concentrating 0  Moving slowly or fidgety/restless 0  Suicidal thoughts 0  PHQ-9 Score 1  Difficult doing work/chores Not difficult at all    ADLs:  In your present state of health, do you have any difficulty performing the following activities: 08/20/2019  Hearing? N  Vision? N  Difficulty concentrating or making decisions? N  Walking or climbing stairs? N  Dressing or bathing? N  Doing errands, shopping? N  Some recent data might be hidden     Cognitive Testing  Alert? Yes  Normal Appearance?Yes  Oriented to person? Yes  Place?  Yes   Time? Yes  Recall of three objects?  Yes  Can perform simple calculations? Yes  Displays appropriate judgment?Yes  Can read the correct time from a watch face?Yes  EOL planning: Does Patient Have a Medical Advance Directive?: Yes Type of Advance Directive: Healthcare Power of Attorney, Living will Does patient want to make changes to medical advance directive?: No - Patient declined Copy of [place] in Chart?: No - copy requested  Review of Systems  Constitutional: Negative for malaise/fatigue and weight loss.  HENT: Negative for hearing loss and tinnitus.   Eyes: Negative for blurred vision and double vision.  Respiratory: Negative for cough, sputum production, shortness of breath and wheezing.   Cardiovascular: Negative for chest pain, palpitations, orthopnea, claudication, leg swelling and PND.  Gastrointestinal: Negative for abdominal pain, blood in stool, constipation, diarrhea, heartburn, melena, nausea and vomiting.   Genitourinary: Negative.   Musculoskeletal: Negative for falls, joint pain and myalgias.  Skin: Negative for rash.  Neurological: Negative for dizziness, tingling, sensory change, weakness and headaches.  Endo/Heme/Allergies: Negative for polydipsia.  Psychiatric/Behavioral: Negative.  Negative for depression, memory loss, substance abuse and suicidal ideas. The patient is not nervous/anxious and does not have insomnia.   All other systems reviewed and are negative.    Objective:     Today's Vitals   08/20/19 1503  BP: 130/80  Pulse: 76  Temp: (!) 95.5 F (35.3 C)  SpO2: 96%  Weight: 200 lb (90.7 kg)  Height: 5\' 7"  (1.702 m)   Body mass index is 31.32 kg/m.  General appearance: alert, no distress, WD/WN, female HEENT: normocephalic, sclerae anicteric, TMs pearly, nares patent, no discharge or erythema, pharynx normal Oral cavity: MMM, no lesions Neck: supple, no lymphadenopathy, no thyromegaly, no masses Heart: RRR, normal S1, S2, no murmurs Lungs: CTA bilaterally, no wheezes, rhonchi, or rales Abdomen: +bs, soft, non tender, non distended, no masses, no hepatomegaly, no splenomegaly Musculoskeletal: nontender, no swelling, no obvious deformity Extremities: no edema, no cyanosis, no clubbing Pulses: 2+ symmetric, upper and lower extremities, normal cap refill Neurological: alert, oriented x 3, CN2-12 intact, strength normal upper extremities and lower extremities, sensation normal throughout, DTRs 2+ throughout, no cerebellar signs, gait normal Psychiatric: normal affect, behavior normal, pleasant   AAA [place]: < 3 cm  Medicare Attestation I have personally reviewed: The patient's medical and social history Their use of alcohol, tobacco or illicit drugs Their current medications and supplements The patient's functional ability including ADLs,fall risks, home safety risks, cognitive, and hearing and visual impairment Diet and physical activities Evidence for depression or  mood disorders  The patient's weight, height, BMI, and visual acuity have been recorded in the chart.  I have made referrals, counseling, and provided education to the patient based on review of the above and I have provided the patient with a written personalized care plan for preventive services.     Izora Ribas, NP   08/20/2019

## 2019-08-21 ENCOUNTER — Other Ambulatory Visit: Payer: Self-pay | Admitting: Adult Health

## 2019-08-21 MED ORDER — NEXLIZET 180-10 MG PO TABS: 1.0000 | ORAL_TABLET | Freq: Every day | ORAL | 1 refills | Status: DC

## 2019-08-22 LAB — CBC WITH DIFFERENTIAL/PLATELET
Absolute Monocytes: 790 cells/uL (ref 200–950)
Basophils Absolute: 38 cells/uL (ref 0–200)
Basophils Relative: 0.4 %
Eosinophils Absolute: 197 cells/uL (ref 15–500)
Eosinophils Relative: 2.1 %
HCT: 42 % (ref 35.0–45.0)
Hemoglobin: 14.3 g/dL (ref 11.7–15.5)
Lymphs Abs: 3017 cells/uL (ref 850–3900)
MCH: 31 pg (ref 27.0–33.0)
MCHC: 34 g/dL (ref 32.0–36.0)
MCV: 91.1 fL (ref 80.0–100.0)
MPV: 10.1 fL (ref 7.5–12.5)
Monocytes Relative: 8.4 %
Neutro Abs: 5358 cells/uL (ref 1500–7800)
Neutrophils Relative %: 57 %
Platelets: 264 10*3/uL (ref 140–400)
RBC: 4.61 10*6/uL (ref 3.80–5.10)
RDW: 13 % (ref 11.0–15.0)
Total Lymphocyte: 32.1 %
WBC: 9.4 10*3/uL (ref 3.8–10.8)

## 2019-08-22 LAB — URINALYSIS W MICROSCOPIC + REFLEX CULTURE
Bacteria, UA: NONE SEEN /HPF
Bilirubin Urine: NEGATIVE
Glucose, UA: NEGATIVE
Hyaline Cast: NONE SEEN /LPF
Ketones, ur: NEGATIVE
Nitrites, Initial: NEGATIVE
Protein, ur: NEGATIVE
Specific Gravity, Urine: 1.012 (ref 1.001–1.03)
WBC, UA: >=60 /HPF — AB (ref 0–5)
pH: 6 (ref 5.0–8.0)

## 2019-08-22 LAB — COMPLETE METABOLIC PANEL WITH GFR
AG Ratio: 1.6 (calc) (ref 1.0–2.5)
ALT: 11 U/L (ref 6–29)
AST: 10 U/L (ref 10–35)
Albumin: 4.2 g/dL (ref 3.6–5.1)
Alkaline phosphatase (APISO): 69 U/L (ref 37–153)
BUN: 14 mg/dL (ref 7–25)
CO2: 26 mmol/L (ref 20–32)
Calcium: 9.8 mg/dL (ref 8.6–10.4)
Chloride: 104 mmol/L (ref 98–110)
Creat: 0.98 mg/dL (ref 0.50–0.99)
GFR, Est African American: 70 mL/min/{1.73_m2} (ref >=60)
GFR, Est Non African American: 61 mL/min/{1.73_m2} (ref >=60)
Globulin: 2.7 g/dL (calc) (ref 1.9–3.7)
Glucose, Bld: 95 mg/dL (ref 65–99)
Potassium: 4 mmol/L (ref 3.5–5.3)
Sodium: 141 mmol/L (ref 135–146)
Total Bilirubin: 0.4 mg/dL (ref 0.2–1.2)
Total Protein: 6.9 g/dL (ref 6.1–8.1)

## 2019-08-22 LAB — URINE CULTURE
MICRO NUMBER:: 10711625
SPECIMEN QUALITY:: ADEQUATE

## 2019-08-22 LAB — TSH: TSH: 2.46 mIU/L (ref 0.40–4.50)

## 2019-08-22 LAB — LIPID PANEL
Cholesterol: 196 mg/dL (ref <200)
HDL: 39 mg/dL — ABNORMAL LOW (ref >=50)
LDL Cholesterol (Calc): 120 mg/dL (calc) — ABNORMAL HIGH
Non-HDL Cholesterol (Calc): 157 mg/dL (calc) — ABNORMAL HIGH (ref <130)
Total CHOL/HDL Ratio: 5 (calc) — ABNORMAL HIGH (ref <5.0)
Triglycerides: 261 mg/dL — ABNORMAL HIGH (ref <150)

## 2019-08-22 LAB — CULTURE INDICATED

## 2019-08-22 LAB — MAGNESIUM: Magnesium: 2 mg/dL (ref 1.5–2.5)

## 2019-08-22 LAB — HEMOGLOBIN A1C
Hgb A1c MFr Bld: 7.3 % of total Hgb — ABNORMAL HIGH (ref <5.7)
Mean Plasma Glucose: 163 (calc)
eAG (mmol/L): 9 (calc)

## 2019-08-31 ENCOUNTER — Other Ambulatory Visit: Payer: 59

## 2019-08-31 ENCOUNTER — Ambulatory Visit: Payer: 59

## 2019-09-03 ENCOUNTER — Ambulatory Visit
Admission: RE | Admit: 2019-09-03 | Discharge: 2019-09-03 | Disposition: A | Discharge status: Home / Self Care | Payer: 59 | Source: Ambulatory Visit | Attending: Adult Health | Admitting: Adult Health

## 2019-09-03 ENCOUNTER — Other Ambulatory Visit: Payer: Self-pay

## 2019-09-03 DIAGNOSIS — Z1231 Encounter for screening mammogram for malignant neoplasm of breast: Secondary | ICD-10-CM

## 2019-09-17 ENCOUNTER — Other Ambulatory Visit: Payer: Self-pay | Admitting: Internal Medicine

## 2019-09-17 DIAGNOSIS — I10 Essential (primary) hypertension: Secondary | ICD-10-CM

## 2019-11-02 ENCOUNTER — Ambulatory Visit
Admission: RE | Admit: 2019-11-02 | Discharge: 2019-11-02 | Disposition: A | Discharge status: Home / Self Care | Payer: 59 | Source: Ambulatory Visit | Attending: Adult Health | Admitting: Adult Health

## 2019-11-02 ENCOUNTER — Other Ambulatory Visit: Payer: Self-pay

## 2019-11-02 ENCOUNTER — Encounter: Payer: Self-pay | Admitting: Adult Health

## 2019-11-02 DIAGNOSIS — M8580 Other specified disorders of bone density and structure, unspecified site: Secondary | ICD-10-CM | POA: Insufficient documentation

## 2019-11-02 DIAGNOSIS — E2839 Other primary ovarian failure: Secondary | ICD-10-CM

## 2019-11-02 DIAGNOSIS — Z13820 Encounter for screening for osteoporosis: Secondary | ICD-10-CM

## 2019-11-23 NOTE — Progress Notes (Signed)
3 MONTH FOLLOW UP  Assessment:    Aortic atherosclerosis Per CT 07/2019 Control blood pressure, cholesterol, glucose, increase exercise.   Essential hypertension - continue medications, DASH diet, exercise and monitor at home. Call if greater than 130/80.  -     CBC with Differential/Platelet -     CMP/GFR -     TSH  T2DM Currently fairly controlled by low dose metformin Education: Reviewed 'ABCs' of diabetes management (respective goals in parentheses): A1C (<7), blood pressure (<130/80), and cholesterol (LDL <70) Eye Exam yearly and Dental Exam every 6 months. She will schedule diabetic eye exam with Dr. Delman Cheadle Foot exam completed Dietary recommendations Physical Activity recommendations  CKD stage 2 due to type 2 diabetes mellitus [facility]) Discussed general issues about diabetes pathophysiology and management., Educational material distributed., Suggested low cholesterol diet., Encouraged aerobic exercise., Discussed foot care., Reminded to get yearly retinal exam. -     CMP/GFR -     Microalbumin  Hyperlipidemia, unspecified hyperlipidemia type On rosuvastatin 5 mg BID, now on nexlizet daily,  LDL goal <70 -continue medications, check lipids, decrease fatty foods, increase activity.  -     Lipid panel  Tobacco use Actively cutting back on smoking, wellbutrin helpful, declines chantix Continue annual low dose screening CT  Emphysema ([place]) Per imaging; STOP SMOKING Denies notable sx, monitoring  Vitamin D deficiency -     VITAMIN D 25 Hydroxy (Vit-D Deficiency, Fractures)  Anxiety Well managed by current regimen; continue medications Stress management techniques discussed, increase water, good sleep hygiene discussed, increase exercise, and increase veggies.   Encounter for long-term (current) use of medications -     Magnesium  Over 40 minutes of exam, counseling, chart review and critical decision making was performed Future Appointments  Date Time  Provider [place]  02/29/2020  4:30 PM Unk Pinto, MD [facility] None  04/28/2020  9:00 AM Liane Comber, NP [facility] None  09/01/2020  3:00 PM Liane Comber, NP [facility] None     Subjective:  Joyce Shannon is a 64 y.o. female who presents for 3 month follow up.   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 On wellbutrin 150 mg daily and doing well with this, very rare use of xanax, typically in the evening. She reports is sleeping fairly at night, improving, denies SE.   she currently continues to smoke <0.5 pack a day, down from previous 1 pack a day; discussed risks associated with smoking, patient is ready to quit, actively working on cutting down. Wellbutrin has been very helpful. She has 30 pack year history, Ct lung 07/20/2019 which showed emphysema.   BMI is Body mass index is 31.23 kg/m., she has been working on diet and exercise, no longer walking due to new job, needs to find new exercise. May restart program with [facility]. Wt Readings from Last 3 Encounters:  11/25/19 199 lb 6.4 oz (90.4 kg)  08/20/19 200 lb (90.7 kg)  04/27/19 203 lb (92.1 kg)   Aortic atherosclerosis per chest CT 07/2019  Her blood pressure has been controlled at home, today their BP is BP: 128/68 She does not workout. She denies chest pain, shortness of breath, dizziness.   She is on cholesterol medication (was on rosuvastatin 5 mg daily, had  myalgia, was tolerating 1-2 days a week but, recently started nexlizet and stopped) and denies myalgias. Her cholesterol is not at goal. The cholesterol last visit was:   Lab Results  Component Value Date   CHOL 196 08/20/2019   HDL 39 (L) 08/20/2019   LDLCALC 120 (H) 08/20/2019   TRIG 261 (H)  08/20/2019   CHOLHDL 5.0 (H) 08/20/2019    She has been working on diet and exercise for T2 diabetes (on metformin 500 mg daily with largest meal, advised to increase to 2-3 tabs, reports will take 1-3 depending on meals), and denies increased appetite, nausea, paresthesia of the feet, polydipsia and polyuria.  Does have supplies but not checking glucose -  Last A1C in the office was:  Lab Results  Component Value Date   HGBA1C 7.3 (H) 08/20/2019   She has CKD II associated with T2DM monitored at this office, on benzapril. Last GFR: Lab Results  Component Value Date   GFRNONAA 61 08/20/2019   Patient is on Vitamin D supplement, was off at last check, reports has been taking ? 1000 IU daily  Lab Results  Component Value Date   VD25OH 21 (L) 04/27/2019      Medication Review: Current Outpatient Medications on File Prior to Visit  Medication Sig Dispense Refill  . ALPRAZolam (XANAX) 0.5 MG tablet Take 1/2 to 1 tablet at Bedtime as needed for Sleep 30 tablet 0  . amLODipine-benazepril (LOTREL) 10-40 MG capsule TAKE 1 CAPSULE DAILY FOR BP 90 capsule 3  . Bempedoic Acid-Ezetimibe (NEXLIZET) 180-10 MG TABS Take 1 tablet by mouth daily. For cholesterol. 90 tablet 1  . buPROPion (WELLBUTRIN XL) 150 MG 24 hr tablet Take 1 tablet Daily for Mood 90 tablet 3  . fexofenadine (ALLEGRA) 180 MG tablet Take 180 mg by mouth daily.    . fluticasone (FLONASE) 50 MCG/ACT nasal spray Place 1 spray into both nostrils 2 (two) times daily as needed.    . metFORMIN (GLUCOPHAGE-XR) 500 MG 24 hr tablet Take 1 tab with each meal - 2-3 tabs/day for diabetes. 90 tablet 3  . vitamin C (ASCORBIC ACID) 500 MG tablet Take 500 mg by mouth daily.    Marland Kitchen VITAMIN D, CHOLECALCIFEROL, PO Take 5,000 Units by mouth.     Marland Kitchen aspirin EC 81 MG tablet Take 81 mg by mouth daily. (Patient not taking: Reported on 11/25/2019)    . rosuvastatin (CRESTOR) 5 MG tablet Take 1 tab once to twice a week as tolerated for cholesterol. (Patient  not taking: Reported on 11/25/2019) 30 tablet 3   No current facility-administered medications on file prior to visit.    Allergies  Allergen Reactions  . Other     Tylenol #3  . Tylox [Oxycodone-Acetaminophen]     Current Problems (verified) Patient Active Problem List   Diagnosis Date Noted  . Osteopenia 11/02/2019  . Aortic arch atherosclerosis ([place]) 07/21/2019  . Emphysema of lung ([place]) 07/21/2019  . Family history of abdominal aortic aneurysm (AAA) 04/22/2018  . Type 2 diabetes mellitus ([place]) 10/04/2017  . Obesity (BMI 30.0-34.9) 06/21/2014  . Vitamin D deficiency 06/21/2014  . CKD stage 2 due to type 2 diabetes mellitus ([place]) 01/07/2014  . Major depressive disorder, recurrent episode, in partial remission with anxious distress ([place]) 01/07/2014  . Medication management 01/07/2014  . Hyperlipidemia associated with type 2 diabetes mellitus ([place]) 01/07/2014  . Tobacco use 01/07/2014  . Essential hypertension 01/22/2012    SURGICAL HISTORY She  has a past surgical history  that includes Cesarean section; Abdominal hysterectomy; Cholecystectomy; ovarian cystectomy (1985); and Tonsillectomy. FAMILY HISTORY Her family history includes AAA (abdominal aortic aneurysm) in her maternal grandfather; Asthma in her daughter; Breast cancer (age of onset: 22) in her mother; Congestive Heart Failure in her father; Diabetes in her brother and mother; Heart attack (age of onset: 5) in her father; Heart disease in her father and mother; Hypertension in her father; Multiple sclerosis in her brother; Prostate cancer (age of onset: 19) in her father. SOCIAL HISTORY She  reports that she has been smoking cigarettes. She started smoking about 41 years ago. She has a 30.75 pack-year smoking history. She has never used smokeless tobacco. She reports that she does not drink alcohol and does not use drugs.   Review of Systems  Constitutional: Negative for malaise/fatigue and weight loss.  HENT:  Negative for hearing loss and tinnitus.   Eyes: Negative for blurred vision and double vision.  Respiratory: Negative for cough, sputum production, shortness of breath and wheezing.   Cardiovascular: Negative for chest pain, palpitations, orthopnea, claudication, leg swelling and PND.  Gastrointestinal: Negative for abdominal pain, blood in stool, constipation, diarrhea, heartburn, melena, nausea and vomiting.  Genitourinary: Negative.   Musculoskeletal: Negative for falls, joint pain and myalgias.  Skin: Negative for rash.  Neurological: Negative for dizziness, tingling, sensory change, weakness and headaches.  Endo/Heme/Allergies: Negative for polydipsia.  Psychiatric/Behavioral: Negative.  Negative for depression, memory loss, substance abuse and suicidal ideas. The patient is not nervous/anxious and does not have insomnia.   All other systems reviewed and are negative.    Objective:     Today's Vitals   11/25/19 1542  BP: 128/68  Pulse: 67  Temp: (!) 97.5 F (36.4 C)  SpO2: 95%  Weight: 199 lb 6.4 oz (90.4 kg)   Body mass index is 31.23 kg/m.  General appearance: alert, no distress, WD/WN, female HEENT: normocephalic, sclerae anicteric, TMs pearly, nares patent, no discharge or erythema, pharynx normal Oral cavity: MMM, no lesions Neck: supple, no lymphadenopathy, no thyromegaly, no masses Heart: RRR, normal S1, S2, no murmurs Lungs: CTA bilaterally, no wheezes, rhonchi, or rales Abdomen: +bs, soft, non tender, non distended, no masses, no hepatomegaly, no splenomegaly Musculoskeletal: nontender, no swelling, no obvious deformity Extremities: no edema, no cyanosis, no clubbing Pulses: 2+ symmetric, upper and lower extremities, normal cap refill Neurological: alert, oriented x 3, CN2-12 intact, strength normal upper extremities and lower extremities, sensation normal throughout, DTRs 2+ throughout, no cerebellar signs, gait normal Psychiatric: normal affect, behavior  normal, pleasant     Izora Ribas, NP   11/25/2019

## 2019-11-25 ENCOUNTER — Other Ambulatory Visit: Payer: Self-pay

## 2019-11-25 ENCOUNTER — Encounter: Payer: Self-pay | Admitting: Adult Health

## 2019-11-25 ENCOUNTER — Ambulatory Visit: Payer: 59 | Admitting: Adult Health

## 2019-11-25 VITALS — BP 128/68 | HR 67 | Temp 97.5°F | Wt 199.4 lb

## 2019-11-25 DIAGNOSIS — E1122 Type 2 diabetes mellitus with diabetic chronic kidney disease: Secondary | ICD-10-CM | POA: Diagnosis not present

## 2019-11-25 DIAGNOSIS — F3341 Major depressive disorder, recurrent, in partial remission: Secondary | ICD-10-CM

## 2019-11-25 DIAGNOSIS — E785 Hyperlipidemia, unspecified: Secondary | ICD-10-CM

## 2019-11-25 DIAGNOSIS — J439 Emphysema, unspecified: Secondary | ICD-10-CM

## 2019-11-25 DIAGNOSIS — I700 Atherosclerosis of aorta: Secondary | ICD-10-CM

## 2019-11-25 DIAGNOSIS — E559 Vitamin D deficiency, unspecified: Secondary | ICD-10-CM

## 2019-11-25 DIAGNOSIS — E669 Obesity, unspecified: Secondary | ICD-10-CM

## 2019-11-25 DIAGNOSIS — I10 Essential (primary) hypertension: Secondary | ICD-10-CM | POA: Diagnosis not present

## 2019-11-25 DIAGNOSIS — Z720 Tobacco use: Secondary | ICD-10-CM

## 2019-11-25 DIAGNOSIS — Z79899 Other long term (current) drug therapy: Secondary | ICD-10-CM

## 2019-11-25 DIAGNOSIS — N181 Chronic kidney disease, stage 1: Secondary | ICD-10-CM

## 2019-11-25 DIAGNOSIS — F418 Other specified anxiety disorders: Secondary | ICD-10-CM

## 2019-11-25 DIAGNOSIS — E1169 Type 2 diabetes mellitus with other specified complication: Secondary | ICD-10-CM

## 2019-11-25 DIAGNOSIS — N182 Chronic kidney disease, stage 2 (mild): Secondary | ICD-10-CM

## 2019-11-25 NOTE — Patient Instructions (Addendum)
Marland Kitchen Goals    .  Blood Pressure < 130/80    .  Exercise 3x per week (10-15 min per time)    .  HEMOGLOBIN A1C < 7.0    .  LDL CALC < 70    .  Quit Smoking (pt-stated)    .  Weight (lb) < 190 lb (86.2 kg)          Restart rosuvastatin at least once a week, continue nexlizet  Ask insurance if they cover shingrix - can get at [facility]  Vitamin D - increase to 5000 IU if not at goal today   Start exercise - ROKU - 2-3 days week       High-Fiber Diet Fiber, also called dietary fiber, is a type of carbohydrate that is found in fruits, vegetables, whole grains, and beans. A high-fiber diet can have many health benefits. Your health care provider may recommend a high-fiber diet to help:  Prevent constipation. Fiber can make your bowel movements more regular.  Lower your cholesterol.  Relieve the following conditions: ? Swelling of veins in the anus (hemorrhoids). ? Swelling and irritation (inflammation) of specific areas of the digestive tract (uncomplicated diverticulosis). ? A problem of the large intestine (colon) that sometimes causes pain and diarrhea (irritable bowel syndrome, IBS).  Prevent overeating as part of a weight-loss plan.  Prevent heart disease, type 2 diabetes, and certain cancers. What is my plan? The recommended daily fiber intake in grams (g) includes:  38 g for men age 60 or younger.  30 g for men over age 47.  66 g for women age 35 or younger.  21 g for women over age 72. You can get the recommended daily intake of dietary fiber by:  Eating a variety of fruits, vegetables, grains, and beans.  Taking a fiber supplement, if it is not possible to get enough fiber through your diet. What do I need to know about a high-fiber diet?  It is better to get fiber through food sources rather than from fiber supplements. There is not a lot of research about how effective supplements are.  Always check the fiber content on the nutrition facts label of any  prepackaged food. Look for foods that contain 5 g of fiber or more per serving.  Talk with a diet and nutrition specialist (dietitian) if you have questions about specific foods that are recommended or not recommended for your medical condition, especially if those foods are not listed below.  Gradually increase how much fiber you consume. If you increase your intake of dietary fiber too quickly, you may have bloating, cramping, or gas.  Drink plenty of water. Water helps you to digest fiber. What are tips for following this plan?  Eat a wide variety of high-fiber foods.  Make sure that half of the grains that you eat each day are whole grains.  Eat breads and cereals that are made with whole-grain flour instead of refined flour or white flour.  Eat brown rice, bulgur wheat, or millet instead of white rice.  Start the day with a breakfast that is high in fiber, such as a cereal that contains 5 g of fiber or more per serving.  Use beans in place of meat in soups, salads, and pasta dishes.  Eat high-fiber snacks, such as berries, raw vegetables, nuts, and popcorn.  Choose whole fruits and vegetables instead of processed forms like juice or sauce. What foods can I eat?  Fruits Berries. Pears. Apples. Oranges. Avocado.  Prunes and raisins. Dried figs. Vegetables Sweet potatoes. Spinach. Kale. Artichokes. Cabbage. Broccoli. Cauliflower. Green peas. Carrots. Squash. Grains Whole-grain breads. Multigrain cereal. Oats and oatmeal. Brown rice. Barley. Bulgur wheat. [place]. Quinoa. Bran muffins. Popcorn. Rye wafer crackers. Meats and other proteins Navy, kidney, and pinto beans. Soybeans. Split peas. Lentils. Nuts and seeds. Dairy Fiber-fortified yogurt. Beverages Fiber-fortified soy milk. Fiber-fortified orange juice. Other foods Fiber bars. The items listed above may not be a complete list of recommended foods and beverages. Contact a dietitian for more options. What foods are not  recommended? Fruits Fruit juice. Cooked, strained fruit. Vegetables Fried potatoes. Canned vegetables. Well-cooked vegetables. Grains White bread. Pasta made with refined flour. White rice. Meats and other proteins Fatty cuts of meat. Fried chicken or fried fish. Dairy Milk. Yogurt. Cream cheese. Sour cream. Fats and oils Butters. Beverages Soft drinks. Other foods Cakes and pastries. The items listed above may not be a complete list of foods and beverages to avoid. Contact a dietitian for more information. Summary  Fiber is a type of carbohydrate. It is found in fruits, vegetables, whole grains, and beans.  There are many health benefits of eating a high-fiber diet, such as preventing constipation, lowering blood cholesterol, helping with weight loss, and reducing your risk of heart disease, diabetes, and certain cancers.  Gradually increase your intake of fiber. Increasing too fast can result in cramping, bloating, and gas. Drink plenty of water while you increase your fiber.  The best sources of fiber include whole fruits and vegetables, whole grains, nuts, seeds, and beans. This information is not intended to replace advice given to you by your health care provider. Make sure you discuss any questions you have with your health care provider. Document Revised: 11/26/2016 Document Reviewed: 11/26/2016 Elsevier Patient Education  2020 [employer].

## 2019-11-26 LAB — COMPLETE METABOLIC PANEL WITH GFR
AG Ratio: 1.6 (calc) (ref 1.0–2.5)
ALT: 9 U/L (ref 6–29)
AST: 10 U/L (ref 10–35)
Albumin: 4.1 g/dL (ref 3.6–5.1)
Alkaline phosphatase (APISO): 86 U/L (ref 37–153)
BUN: 14 mg/dL (ref 7–25)
CO2: 27 mmol/L (ref 20–32)
Calcium: 9.5 mg/dL (ref 8.6–10.4)
Chloride: 107 mmol/L (ref 98–110)
Creat: 0.91 mg/dL (ref 0.50–0.99)
GFR, Est African American: 77 mL/min/{1.73_m2} (ref >=60)
GFR, Est Non African American: 66 mL/min/{1.73_m2} (ref >=60)
Globulin: 2.5 g/dL (calc) (ref 1.9–3.7)
Glucose, Bld: 108 mg/dL — ABNORMAL HIGH (ref 65–99)
Potassium: 4.3 mmol/L (ref 3.5–5.3)
Sodium: 141 mmol/L (ref 135–146)
Total Bilirubin: 0.3 mg/dL (ref 0.2–1.2)
Total Protein: 6.6 g/dL (ref 6.1–8.1)

## 2019-11-26 LAB — CBC WITH DIFFERENTIAL/PLATELET
Absolute Monocytes: 801 cells/uL (ref 200–950)
Basophils Absolute: 62 cells/uL (ref 0–200)
Basophils Relative: 0.7 %
Eosinophils Absolute: 194 cells/uL (ref 15–500)
Eosinophils Relative: 2.2 %
HCT: 39.3 % (ref 35.0–45.0)
Hemoglobin: 13.6 g/dL (ref 11.7–15.5)
Lymphs Abs: 3344 cells/uL (ref 850–3900)
MCH: 31.7 pg (ref 27.0–33.0)
MCHC: 34.6 g/dL (ref 32.0–36.0)
MCV: 91.6 fL (ref 80.0–100.0)
MPV: 10.2 fL (ref 7.5–12.5)
Monocytes Relative: 9.1 %
Neutro Abs: 4400 cells/uL (ref 1500–7800)
Neutrophils Relative %: 50 %
Platelets: 241 10*3/uL (ref 140–400)
RBC: 4.29 10*6/uL (ref 3.80–5.10)
RDW: 12.5 % (ref 11.0–15.0)
Total Lymphocyte: 38 %
WBC: 8.8 10*3/uL (ref 3.8–10.8)

## 2019-11-26 LAB — MAGNESIUM: Magnesium: 2.1 mg/dL (ref 1.5–2.5)

## 2019-11-26 LAB — LIPID PANEL
Cholesterol: 195 mg/dL (ref <200)
HDL: 33 mg/dL — ABNORMAL LOW (ref >=50)
Non-HDL Cholesterol (Calc): 162 mg/dL (calc) — ABNORMAL HIGH (ref <130)
Total CHOL/HDL Ratio: 5.9 (calc) — ABNORMAL HIGH (ref <5.0)
Triglycerides: 433 mg/dL — ABNORMAL HIGH (ref <150)

## 2019-11-26 LAB — HEMOGLOBIN A1C
Hgb A1c MFr Bld: 7.4 % of total Hgb — ABNORMAL HIGH (ref <5.7)
Mean Plasma Glucose: 166 (calc)
eAG (mmol/L): 9.2 (calc)

## 2019-11-26 LAB — TSH: TSH: 3.12 mIU/L (ref 0.40–4.50)

## 2019-11-26 LAB — VITAMIN D 25 HYDROXY (VIT D DEFICIENCY, FRACTURES): Vit D, 25-Hydroxy: 26 ng/mL — ABNORMAL LOW (ref 30–100)

## 2020-01-21 ENCOUNTER — Other Ambulatory Visit: Payer: Self-pay | Admitting: Internal Medicine

## 2020-02-29 ENCOUNTER — Ambulatory Visit: Payer: 59 | Admitting: Internal Medicine

## 2020-03-14 ENCOUNTER — Other Ambulatory Visit: Payer: Self-pay | Admitting: Internal Medicine

## 2020-03-14 MED ORDER — METFORMIN HCL ER 500 MG PO TB24
ORAL_TABLET | ORAL | 0 refills | Status: DC
Start: 2020-03-14 — End: 2020-05-30

## 2020-03-17 ENCOUNTER — Encounter: Payer: Self-pay | Admitting: Adult Health

## 2020-03-17 ENCOUNTER — Ambulatory Visit: Payer: 59 | Admitting: Adult Health

## 2020-03-17 ENCOUNTER — Other Ambulatory Visit: Payer: Self-pay

## 2020-03-17 DIAGNOSIS — U071 COVID-19: Secondary | ICD-10-CM | POA: Insufficient documentation

## 2020-03-17 MED ORDER — DEXAMETHASONE 1 MG PO TABS: ORAL_TABLET | ORAL | 0 refills | Status: DC

## 2020-03-17 MED ORDER — PROMETHAZINE-DM 6.25-15 MG/5ML PO SYRP: 5.0000 mL | ORAL_SOLUTION | Freq: Four times a day (QID) | ORAL | 1 refills | Status: DC | PRN

## 2020-03-17 NOTE — Progress Notes (Signed)
THIS ENCOUNTER IS A VIRTUAL VISIT DUE TO COVID-19 - PATIENT WAS NOT SEEN IN THE OFFICE.  PATIENT HAS CONSENTED TO VIRTUAL VISIT / TELEMEDICINE VISIT   Virtual Visit via telephone Note  I connected with Joyce Shannon on 03/17/2020 by telephone.  I verified that I am speaking with the correct person using two identifiers.    I discussed the limitations of evaluation and management by telemedicine and the availability of in person appointments. The patient expressed understanding and agreed to proceed.  History of Present Illness:  There were no vitals taken for this visit.  65 y.o. patient, smoker with hx of htn, T2DM contacted office due to URI sx suspect for covid 19. They were found to be positive on rapid screen, OV was converted to telephone visit to minimize exposure in office. This patient was vaccinated and booster for covid 19.   Sx began 2 days days ago with nasal congestion, thought her sx were allergies; started having headache started alka seltzer plus the next day; started having mild productive cough (thick yellow), only a few times a day, feeling tired today. She took a home test x 2 last night, both positive. She denies sore throat, fever/chills, dyspnea, wheezing. Denies GI sx.    She takes fexofenadine year round due to allergies; uses flonase PRN, has been doing this with good results. Has saline irrigations, will start. Daughter has been checking on her, dropping off supplies if needed  Exposures: no known, standard precautions- went to [employer] with mask over the weekend, does work with mask on.   Vaccination: 3/3, moderna  T2DM; Admits hasn't been checking glucose; she does have supplies; agreeable to start checking -  Lab Results  Component Value Date   HGBA1C 7.4 (H) 11/25/2019    Medications  Current Outpatient Medications (Endocrine & Metabolic):  .  dexamethasone (DECADRON) 1 MG tablet, Take 3 tabs for 3 days, 2 tabs for 3 days 1 tab for 5 days. Take with  food. .  metFORMIN (GLUCOPHAGE-XR) 500 MG 24 hr tablet, Take  1 tablet  3 x /day  with Meals for Diabetes  Current Outpatient Medications (Cardiovascular):  .  amLODipine-benazepril (LOTREL) 10-40 MG capsule, TAKE 1 CAPSULE DAILY FOR BP .  ezetimibe (ZETIA) 10 MG tablet, Take 10 mg by mouth daily. .  Bempedoic Acid-Ezetimibe (NEXLIZET) 180-10 MG TABS, Take 1 tablet by mouth daily. For cholesterol. (Patient not taking: Reported on 03/17/2020) .  rosuvastatin (CRESTOR) 5 MG tablet, Take 1 tab once to twice a week as tolerated for cholesterol. (Patient not taking: No sig reported)  Current Outpatient Medications (Respiratory):  .  fexofenadine (ALLEGRA) 180 MG tablet, Take 180 mg by mouth daily. .  fluticasone (FLONASE) 50 MCG/ACT nasal spray, Place 1 spray into both nostrils 2 (two) times daily as needed. .  promethazine-dextromethorphan (PROMETHAZINE-DM) 6.25-15 MG/5ML syrup, Take 5 mLs by mouth 4 (four) times daily as needed for cough.  Current Outpatient Medications (Analgesics):  .  aspirin EC 81 MG tablet, Take 81 mg by mouth daily. (Patient not taking: No sig reported)   Current Outpatient Medications (Other):  Marland Kitchen  ALPRAZolam (XANAX) 0.5 MG tablet, Take 1/2 to 1 tablet at Bedtime as needed for Sleep .  buPROPion (WELLBUTRIN XL) 150 MG 24 hr tablet, Take     1 tablet     Daily      for Mood .  vitamin C (ASCORBIC ACID) 500 MG tablet, Take 500 mg by mouth daily. Marland Kitchen  VITAMIN D,  CHOLECALCIFEROL, PO, Take 5,000 Units by mouth.   Allergies:  Allergies  Allergen Reactions  . Other     Tylenol #3  . Tylox [Oxycodone-Acetaminophen]     Problem list She has Essential hypertension; CKD stage 2 due to type 2 diabetes mellitus ([place]); Major depressive disorder, recurrent episode, in partial remission with anxious distress ([place]); Medication management; Hyperlipidemia associated with type 2 diabetes mellitus ([place]); Tobacco use; Obesity (BMI 30.0-34.9); Vitamin D deficiency; Type 2 diabetes mellitus  ([place]); Family history of abdominal aortic aneurysm (AAA); Aortic arch atherosclerosis ([place]); Emphysema of lung ([place]); and Osteopenia on their problem list.   Social History:   reports that she has been smoking cigarettes. She started smoking about 41 years ago. She has a 30.75 pack-year smoking history. She has never used smokeless tobacco. She reports that she does not drink alcohol and does not use drugs.   Observations/Objective:  General : Well sounding patient in no apparent distress HEENT: no hoarseness, no cough for duration of visit Lungs: speaks in complete sentences, no audible wheezing, no apparent distress Neurological: alert, oriented x 3 Psychiatric: pleasant, judgement appropriate   Assessment and Plan:  COVID-19  Covid 19 positive per rapid screening test in vaccinated Currently symptoms are mild  Risk factors include smoker, age 67+, T2DM Regular breathing exercises, proning EC bASA daily for clot prevention unless contraindicated, regular walking/calf exercises Take tylenol PRN temp 101+ Push hydration Sx supportive therapy Steroid taper was offered to have on hand; she will start checking glucose, monitor; start only if sx getting worse over the weekend;  Immune support with vitamin C, zinc, vitamin D reviewed Follow up via mychart or telephone if needed Advised patient obtain O2 monitor; present to ED if persistently <88% or with severe dyspnea, any CP, fever uncontrolled by tylenol, confusion, sudden decline Should remain in isolation until at least 10 days from onset of sx, 24-48 hours fever free without tylenol, sx such as cough are improved.    Follow Up Instructions:  I discussed the assessment and treatment plan with the patient. The patient was provided an opportunity to ask questions and all were answered. The patient agreed with the plan and demonstrated an understanding of the instructions.   The patient was advised to call back or seek an in-person  evaluation if the symptoms worsen or if the condition fails to improve as anticipated.  I provided 20 minutes of non-face-to-face time during this encounter.   Izora Ribas, NP

## 2020-03-20 ENCOUNTER — Other Ambulatory Visit: Payer: Self-pay | Admitting: Adult Health

## 2020-04-17 ENCOUNTER — Other Ambulatory Visit: Payer: Self-pay | Admitting: Adult Health

## 2020-04-26 ENCOUNTER — Other Ambulatory Visit: Payer: Self-pay | Admitting: Internal Medicine

## 2020-04-28 ENCOUNTER — Encounter: Payer: 59 | Admitting: Adult Health

## 2020-05-29 ENCOUNTER — Other Ambulatory Visit: Payer: Self-pay | Admitting: Internal Medicine

## 2020-05-31 ENCOUNTER — Encounter: Payer: 59 | Admitting: Adult Health

## 2020-06-23 ENCOUNTER — Other Ambulatory Visit: Payer: Self-pay | Admitting: Internal Medicine

## 2020-07-16 DIAGNOSIS — K5790 Diverticulosis of intestine, part unspecified, without perforation or abscess without bleeding: Secondary | ICD-10-CM | POA: Insufficient documentation

## 2020-07-16 DIAGNOSIS — Z860101 Personal history of adenomatous and serrated colon polyps: Secondary | ICD-10-CM | POA: Insufficient documentation

## 2020-07-16 DIAGNOSIS — Z86010 Personal history of colonic polyps: Secondary | ICD-10-CM | POA: Insufficient documentation

## 2020-07-16 DIAGNOSIS — E538 Deficiency of other specified B group vitamins: Secondary | ICD-10-CM | POA: Insufficient documentation

## 2020-07-16 NOTE — Progress Notes (Signed)
CPE AND FOLLOW UP  Assessment:    Encounter for Annual Physical Exam with abnormal findings Due annually   Aortic atherosclerosis Per CT 07/2019 Control blood pressure, cholesterol, glucose, increase exercise.   Essential hypertension - continue medications, DASH diet, exercise and monitor at home. Call if greater than 130/80.  -     CBC with Differential/Platelet -     CMP/GFR -     TSH -     Magnesium  -     UA/Microalbumin -     EKG   T2DM (HCC) Currently fairly controlled by low dose metformin Education: Reviewed 'ABCs' of diabetes management (respective goals in parentheses):  A1C (<7), blood pressure (<130/80), and cholesterol (LDL <70) Eye Exam yearly and Dental Exam every 6 months. She will schedule diabetic eye exam with Dr. Delman Cheadle Foot exam completed Dietary recommendations Physical Activity recommendations       -     A1C   CKD stage 2 due to type 2 diabetes mellitus [name]) Discussed general issues about diabetes pathophysiology and management., Educational material distributed., Suggested low cholesterol diet., Encouraged aerobic exercise., Discussed foot care., Reminded to get yearly retinal exam. -     CMP/GFR -     UA/ Microalbumin   Hyperlipidemia associated with T2DM ([place]) Did tolerat rosuvastatin 5 mg twice weekly, zetia 10 mg daily but non-compliant; discussed risks, benefits, barriers; receptive to restarting current plan LDL goal <70 -continue medications, check lipids, decrease fatty foods, increase activity.  -     Lipid panel -     TSH   Tobacco use (30+ years, current smoker)  Actively cutting back on smoking, wellbutrin helpful, declines chantix She is reducing gradually Continue annual low dose screening CT - order placed today    Emphysema ([place]) Per imaging; STOP SMOKING Denies notable sx, no inhalers, monitoring  Vitamin D deficiency -     VITAMIN D 25 Hydroxy (Vit-D Deficiency, Fractures)   Osteopenia - get dexa q2y, continue Vit D  and Ca, weight bearing exercises  Anxiety Well managed by current regimen; continue medications Stress management techniques discussed, increase water, good sleep hygiene discussed, increase exercise, and increase veggies.    Diverticulosis Encourage high fiber diet;  History of adenomatous colon polyps Overdue colonoscopy, Dr. Havery Moros, patient has number to schedule but with poor follow through Will placed referral back to coordinate  Family history of AAA [place] screening 08/2019, recent chest CT negative for aortic dilation   Encounter for long-term (current) use of medications -     Magnesium  R temple mass ? Cyst or lipoma Bothersome with glasses and getting larger Will refer to skin surgery center for biopsy/excision.   Obesity Commended excellent progress Continue exercise Recommended diet heavy in fruits and veggies and low in animal meats, cheeses, and dairy products, appropriate calorie intake Discussed appropriate weight for height  Follow up at next visit   Orders Placed This Encounter  Procedures   CT CHEST LUNG CA SCREEN LOW DOSE W/O CM   CBC with Differential/Platelet   COMPLETE METABOLIC PANEL WITH GFR   Magnesium   Lipid panel   TSH   Hemoglobin A1c   VITAMIN D 25 Hydroxy (Vit-D Deficiency, Fractures)   Vitamin B12   Microalbumin / creatinine urine ratio   Urinalysis, Routine w reflex microscopic   Ambulatory referral to Dermatology   Ambulatory referral to Gastroenterology   EKG 12-Lead   HM DIABETES FOOT EXAM     Over 40 minutes of exam, counseling, chart  review and critical decision making was performed Future Appointments  Date Time Provider [place]  07/18/2021  2:00 PM Liane Comber, NP [facility] None     Plan:   During the course of the visit the patient was educated and counseled about appropriate screening and preventive services including:   Pneumococcal vaccine  Prevnar 13 Influenza vaccine Td vaccine Screening  electrocardiogram Bone densitometry screening Colorectal cancer screening Diabetes screening Glaucoma screening Nutrition counseling  Advanced directives: requested   Subjective:  Joyce Shannon is a 65 y.o. female who presents for CPE. She has Essential hypertension; CKD stage 2 due to type 2 diabetes mellitus ([place]); Major depressive disorder, recurrent episode, in partial remission with anxious distress ([place]); Medication management; Hyperlipidemia associated with type 2 diabetes mellitus ([place]); Tobacco use (30+ years, current smoker) ; Obesity (BMI 30.0-34.9); Vitamin D deficiency; Type 2 diabetes mellitus ([place]); Family history of abdominal aortic aneurysm (AAA); Aortic arch atherosclerosis ([place]); Emphysema of lung ([place]); Osteopenia; COVID-19 (03/15/2020); B12 deficiency; Diverticulosis; and History of adenomatous polyp of colon on their problem list.   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   She notes ? Cyst to R temple area, present for several years, seems to be getting larger, hits where glasses lay and increasingly tender.   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 On wellbutrin 150 mg daily and doing well with this, very rare use of xanax, typically rare use in the evening. She reports is sleeping fairly at night, improving, denies SE.    she currently continues to smoke <0.5 pack a day, down from previous 1 pack a day; discussed risks associated with smoking, patient is ready to quit, actively working on cutting down. Wellbutrin has been very helpful. She has 30 pack year history, Ct lung 07/20/2019 showed emphysema.   BMI is Body mass index is 30.05 kg/m., she has been working on diet and exercise, doing HIIT 3 days a week. Eating less meat, has a garden.  Wt Readings from Last 3 Encounters:  07/18/20 189 lb (85.7  kg)  11/25/19 199 lb 6.4 oz (90.4 kg)  08/20/19 200 lb (90.7 kg)   Aortic atherosclerosis per chest CT 07/2019  Her blood pressure has been controlled at home, today their BP is BP: 138/80 She does workout. She denies chest pain, shortness of breath, dizziness.   She is on cholesterol medication (was on rosuvastatin 5 mg daily, had myalgia, did tolerate twice a week, on zeita 10 mg daily) and denies myalgias - admits has been off of med for several months, receptive to restart. Her cholesterol is not at goal. The cholesterol last visit was:   Lab Results  Component Value Date   CHOL 195 11/25/2019   HDL 33 (L) 11/25/2019   [place]  11/25/2019     Comment:     . LDL cholesterol not calculated. Triglyceride levels greater than 400 mg/dL invalidate calculated LDL results. . Reference range: <100 . Desirable range <100 mg/dL for primary prevention;   <70 mg/dL for patients with CHD or diabetic patients  with > or = 2 CHD risk factors. Marland Kitchen LDL-C is now calculated using the Martin-Hopkins  calculation, which is a validated novel method providing  better accuracy than the Friedewald equation in the  estimation of LDL-C.  Cresenciano Genre et al. Annamaria Helling. 9604;540(98): 2061-2068  (http://education.[employer].com/faq/FAQ164)  TRIG 433 (H) 11/25/2019   CHOLHDL 5.9 (H) 11/25/2019    She has been working on diet and exercise for T2 diabetes (on metformin 500 mg daily with largest meal, doesn't take 3 as prescribed), on bASA, statin, ACEi and denies increased appetite, nausea, paresthesia of the feet, polydipsia and polyuria. Last A1C in the office was:  Lab Results  Component Value Date   HGBA1C 7.4 (H) 11/25/2019   She has CKD II associated with T2DM monitored at this office. She is on ACEi. Last GFR: Lab Results  Component Value Date   GFRNONAA 66 11/25/2019   Patient is on Vitamin D supplement, was off at last visit, reports has been taking ? 5000 IU daily  Lab Results  Component  Value Date   VD25OH 77 (L) 11/25/2019     She is on a B complex since last year  Lab Results  Component Value Date   VITAMINB12 311 04/27/2019      Medication Review: Current Outpatient Medications on File Prior to Visit  Medication Sig Dispense Refill   ALPRAZolam (XANAX) 0.5 MG tablet Take 1/2 to 1 tablet at Bedtime as needed for Sleep 30 tablet 0   amLODipine-benazepril (LOTREL) 10-40 MG capsule TAKE 1 CAPSULE DAILY FOR BP 90 capsule 3   buPROPion (WELLBUTRIN XL) 150 MG 24 hr tablet TAKE 1 TABLET BY MOUTH DAILY FOR MOOD 90 tablet 3   fexofenadine (ALLEGRA) 180 MG tablet Take 180 mg by mouth daily.     fluticasone (FLONASE) 50 MCG/ACT nasal spray Place 1 spray into both nostrils 2 (two) times daily as needed.     metFORMIN (GLUCOPHAGE-XR) 500 MG 24 hr tablet TAKE 1 TABLET 3 TIMES A DAY WITH MEALS FOR DIABETES 90 tablet 2   vitamin C (ASCORBIC ACID) 500 MG tablet Take 500 mg by mouth daily.     VITAMIN D, CHOLECALCIFEROL, PO Take 5,000 Units by mouth.      aspirin EC 81 MG tablet Take 81 mg by mouth daily. (Patient not taking: No sig reported)     dexamethasone (DECADRON) 1 MG tablet Take 3 tabs for 3 days, 2 tabs for 3 days 1 tab for 5 days. Take with food. 20 tablet 0   ezetimibe (ZETIA) 10 MG tablet TAKE 1 TABLET BY MOUTH EVERY DAY FOR CHOLESTEROL (Patient not taking: Reported on 07/18/2020) 90 tablet 1   promethazine-dextromethorphan (PROMETHAZINE-DM) 6.25-15 MG/5ML syrup Take 5 mLs by mouth 4 (four) times daily as needed for cough. 240 mL 1   rosuvastatin (CRESTOR) 5 MG tablet Take 1 tab once to twice a week as tolerated for cholesterol. (Patient not taking: No sig reported) 30 tablet 3   No current facility-administered medications on file prior to visit.    Allergies  Allergen Reactions   Other     Tylenol #3   Tylox [Oxycodone-Acetaminophen]     Current Problems (verified) Patient Active Problem List   Diagnosis Date Noted   B12 deficiency 07/16/2020   Diverticulosis  07/16/2020   History of adenomatous polyp of colon 07/16/2020   COVID-19 (03/15/2020) 03/17/2020   Osteopenia 11/02/2019   Aortic arch atherosclerosis ([place]) 07/21/2019   Emphysema of lung ([place]) 07/21/2019   Family history of abdominal aortic aneurysm (AAA) 04/22/2018   Type 2 diabetes mellitus ([place]) 10/04/2017   Obesity (BMI 30.0-34.9) 06/21/2014   Vitamin D deficiency 06/21/2014   CKD stage 2 due to type 2 diabetes mellitus ([place]) 01/07/2014   Major depressive disorder, recurrent episode, in partial remission with  anxious distress ([place]) 01/07/2014   Medication management 01/07/2014   Hyperlipidemia associated with type 2 diabetes mellitus ([place]) 01/07/2014   Tobacco use (30+ years, current smoker)  01/07/2014   Essential hypertension 01/22/2012    Screening Tests Immunization History  Administered Date(s) Administered   Influenza, High Dose Seasonal PF 11/03/2019   Influenza-Unspecified 10/28/2014, 11/06/2018   PFIZER(Purple Top)SARS-COV-2 Vaccination 03/21/2019, 04/15/2019, 11/26/2019   Pneumococcal Conjugate-13 08/20/2019   Pneumococcal Polysaccharide-23 03/19/2016   Td 10/07/2003   Tdap 03/19/2016   Tetanus: 2018 Pneumovax: 2018 Prevnar 13:  2021 Flu vaccine: 2021 at work  Shingrix: Check with insurance Covid 36 2/2 2021, pfizer + booster   LMP s/p hysterectomy Pap: 2008 before hysterectomy, DONE MGM: 08/31/2019, will call to schedule  DEXA: 10/2019 T score -1.9  Colonoscopy:12/2015 numerous polyps, Dr. Havery Moros WAS DUE 12/2018 - poor follow through will refer back  CT lungs 07/2019, will schedule CT AAA [place]: normal 08/2019  Names of Other Physician/Practitioners you currently use: 1. [facility] here for primary care 2. [facility], eye doctor, last visit 07/01/2019 abstracted - will call to schedule  3. Dr. Gilford Rile, dentist, last visit 2021, reminded to schedule   Patient Care Team: Unk Pinto, MD as PCP - General  (Internal Medicine) Armbruster, Carlota Raspberry, MD as Consulting Physician (Gastroenterology)  SURGICAL HISTORY She  has a past surgical history that includes Cesarean section; Abdominal hysterectomy; Cholecystectomy; ovarian cystectomy (1985); and Tonsillectomy. FAMILY HISTORY Her family history includes AAA (abdominal aortic aneurysm) in her maternal grandfather; Asthma in her daughter; Breast cancer (age of onset: 70) in her mother; Congestive Heart Failure in her father; Diabetes in her brother and mother; Heart attack (age of onset: 17) in her father; Heart disease in her father and mother; Hypertension in her father; Multiple sclerosis in her brother; Prostate cancer (age of onset: 35) in her father. SOCIAL HISTORY She  reports that she has been smoking cigarettes. She started smoking about 42 years ago. She has a 30.75 pack-year smoking history. She has never used smokeless tobacco. She reports that she does not drink alcohol and does not use drugs.   Review of Systems  Constitutional:  Negative for malaise/fatigue and weight loss.  HENT:  Negative for hearing loss and tinnitus.   Eyes:  Negative for blurred vision and double vision.  Respiratory:  Negative for cough, sputum production, shortness of breath and wheezing.   Cardiovascular:  Negative for chest pain, palpitations, orthopnea, claudication, leg swelling and PND.  Gastrointestinal:  Negative for abdominal pain, blood in stool, constipation, diarrhea, heartburn, melena, nausea and vomiting.  Genitourinary: Negative.   Musculoskeletal:  Negative for falls, joint pain and myalgias.  Skin:  Negative for rash.  Neurological:  Negative for dizziness, tingling, sensory change, weakness and headaches.  Endo/Heme/Allergies:  Negative for polydipsia.  Psychiatric/Behavioral: Negative.  Negative for depression, memory loss, substance abuse and suicidal ideas. The patient is not nervous/anxious and does not have insomnia.   All other systems  reviewed and are negative.   Objective:     Today's Vitals   07/18/20 1402  BP: 138/80  Pulse: 70  Temp: (!) 97.2 F (36.2 C)  SpO2: 97%  Weight: 189 lb (85.7 kg)  Height: 5' 6.5" (1.689 m)   Body mass index is 30.05 kg/m.  General appearance: alert, no distress, WD/WN, female HEENT: normocephalic, sclerae anicteric, TMs pearly, nares patent, no discharge or erythema, pharynx normal Oral cavity: MMM, good dentition, no lesions Neck: supple, no  lymphadenopathy, no thyromegaly, no masses Heart: RRR, normal S1, S2, no murmurs Lungs: CTA bilaterally, no wheezes, rhonchi, or rales Abdomen: +bs, soft, non tender, non distended, no masses, no hepatomegaly, no splenomegaly Musculoskeletal: nontender, no swelling, no obvious deformity. Symmetrical ROM. Steady non-antalgic gait Extremities: no edema, no cyanosis, no clubbing Pulses: 2+ symmetric, upper and lower extremities, normal cap refill Neurological: alert, oriented x 3, CN2-12 intact, strength normal upper extremities and lower extremities, sensation normal throughout, DTRs 2+ throughout, no cerebellar signs, gait normal Psychiatric: normal affect, behavior normal, pleasant  Breasts: Getting mammograms, no concerns, declines GU: Defer Skin: warm, dry, intact; no rashes or concerning nevi, no ecchymoses. R temple with smooth 2 cm mobile mass to temple area.   EKG: sinus bradycardia, IRBBB   The patient's weight, height, BMI, and visual acuity have been recorded in the chart.  I have made referrals, counseling, and provided education to the patient based on review of the above and I have provided the patient with a written personalized care plan for preventive services.     Izora Ribas, NP   07/18/2020

## 2020-07-18 ENCOUNTER — Encounter: Payer: Self-pay | Admitting: Adult Health

## 2020-07-18 ENCOUNTER — Ambulatory Visit: Payer: 59 | Admitting: Adult Health

## 2020-07-18 ENCOUNTER — Other Ambulatory Visit: Payer: Self-pay

## 2020-07-18 VITALS — BP 138/80 | HR 70 | Temp 97.2°F | Ht 66.5 in | Wt 189.0 lb

## 2020-07-18 DIAGNOSIS — E1122 Type 2 diabetes mellitus with diabetic chronic kidney disease: Secondary | ICD-10-CM

## 2020-07-18 DIAGNOSIS — Z0000 Encounter for general adult medical examination without abnormal findings: Secondary | ICD-10-CM

## 2020-07-18 DIAGNOSIS — I700 Atherosclerosis of aorta: Secondary | ICD-10-CM | POA: Diagnosis not present

## 2020-07-18 DIAGNOSIS — Z720 Tobacco use: Secondary | ICD-10-CM

## 2020-07-18 DIAGNOSIS — F3341 Major depressive disorder, recurrent, in partial remission: Secondary | ICD-10-CM

## 2020-07-18 DIAGNOSIS — I10 Essential (primary) hypertension: Secondary | ICD-10-CM | POA: Diagnosis not present

## 2020-07-18 DIAGNOSIS — E669 Obesity, unspecified: Secondary | ICD-10-CM

## 2020-07-18 DIAGNOSIS — Z122 Encounter for screening for malignant neoplasm of respiratory organs: Secondary | ICD-10-CM

## 2020-07-18 DIAGNOSIS — E1169 Type 2 diabetes mellitus with other specified complication: Secondary | ICD-10-CM

## 2020-07-18 DIAGNOSIS — R220 Localized swelling, mass and lump, head: Secondary | ICD-10-CM

## 2020-07-18 DIAGNOSIS — M8580 Other specified disorders of bone density and structure, unspecified site: Secondary | ICD-10-CM

## 2020-07-18 DIAGNOSIS — N182 Chronic kidney disease, stage 2 (mild): Secondary | ICD-10-CM

## 2020-07-18 DIAGNOSIS — Z1329 Encounter for screening for other suspected endocrine disorder: Secondary | ICD-10-CM

## 2020-07-18 DIAGNOSIS — Z8249 Family history of ischemic heart disease and other diseases of the circulatory system: Secondary | ICD-10-CM

## 2020-07-18 DIAGNOSIS — E538 Deficiency of other specified B group vitamins: Secondary | ICD-10-CM

## 2020-07-18 DIAGNOSIS — J439 Emphysema, unspecified: Secondary | ICD-10-CM

## 2020-07-18 DIAGNOSIS — Z136 Encounter for screening for cardiovascular disorders: Secondary | ICD-10-CM

## 2020-07-18 DIAGNOSIS — Z79899 Other long term (current) drug therapy: Secondary | ICD-10-CM

## 2020-07-18 DIAGNOSIS — Z1389 Encounter for screening for other disorder: Secondary | ICD-10-CM

## 2020-07-18 DIAGNOSIS — Z86010 Personal history of colonic polyps: Secondary | ICD-10-CM

## 2020-07-18 DIAGNOSIS — E559 Vitamin D deficiency, unspecified: Secondary | ICD-10-CM

## 2020-07-18 DIAGNOSIS — K5790 Diverticulosis of intestine, part unspecified, without perforation or abscess without bleeding: Secondary | ICD-10-CM

## 2020-07-18 NOTE — Patient Instructions (Addendum)
  Ms. Kable , Thank you for taking time to come for your Annual Wellness Visit. I appreciate your ongoing commitment to your health goals. Please review the following plan we discussed and let me know if I can assist you in the future.   These are the goals we discussed:  Goals       Blood Pressure < 130/80      Exercise 3x per week (10-15 min per time)      HEMOGLOBIN A1C < 7.0      LDL CALC < 70      Quit Smoking (pt-stated)      Weight (lb) < 170 lb (77.1 kg)        This is a list of the screening recommended for you and due dates:  Health Maintenance  Topic Date Due   Zoster (Shingles) Vaccine (1 of 2) Never done   Colon Cancer Screening  12/07/2018   Hemoglobin A1C  05/25/2020   Eye exam for diabetics  06/30/2020   COVID-19 Vaccine (4 - Booster for Pfizer series) 08/03/2020*   Flu Shot  09/05/2020   Pneumonia vaccines (2 of 2 - PPSV23) 03/19/2021   Complete foot exam   07/18/2021   Mammogram  09/02/2021   Tetanus Vaccine  03/19/2026   DEXA scan (bone density measurement)  Completed   Hepatitis C Screening: USPSTF Recommendation to screen - Ages 90-79 yo.  Completed   HPV Vaccine  Aged Out  *Topic was postponed. The date shown is not the original due date.      Know what a healthy weight is for you (roughly BMI <25) and aim to maintain this  Aim for 7+ servings of fruits and vegetables daily  65-80+ fluid ounces of water or unsweet tea for healthy kidneys  Limit to max 1 drink of alcohol per day; avoid smoking/tobacco  Limit animal fats in diet for cholesterol and heart health - choose grass fed whenever available  Avoid highly processed foods, and foods high in saturated/trans fats  Aim for low stress - take time to unwind and care for your mental health  Aim for 150 min of moderate intensity exercise weekly for heart health, and weights twice weekly for bone health  Aim for 7-9 hours of sleep daily    A great goal to work towards is aiming to get in a  serving daily of some of the most nutritionally dense foods - G- BOMBS daily

## 2020-07-19 LAB — MICROALBUMIN / CREATININE URINE RATIO
Creatinine, Urine: 105 mg/dL (ref 20–275)
Microalb Creat Ratio: 16 mcg/mg creat (ref <30)
Microalb, Ur: 1.7 mg/dL

## 2020-07-19 LAB — MAGNESIUM: Magnesium: 2.1 mg/dL (ref 1.5–2.5)

## 2020-07-19 LAB — COMPLETE METABOLIC PANEL WITH GFR
AG Ratio: 1.4 (calc) (ref 1.0–2.5)
ALT: 12 U/L (ref 6–29)
AST: 13 U/L (ref 10–35)
Albumin: 4.2 g/dL (ref 3.6–5.1)
Alkaline phosphatase (APISO): 73 U/L (ref 37–153)
BUN: 12 mg/dL (ref 7–25)
CO2: 28 mmol/L (ref 20–32)
Calcium: 9.7 mg/dL (ref 8.6–10.4)
Chloride: 106 mmol/L (ref 98–110)
Creat: 0.89 mg/dL (ref 0.50–0.99)
GFR, Est African American: 78 mL/min/{1.73_m2} (ref >=60)
GFR, Est Non African American: 68 mL/min/{1.73_m2} (ref >=60)
Globulin: 3.1 g/dL (calc) (ref 1.9–3.7)
Glucose, Bld: 98 mg/dL (ref 65–99)
Potassium: 4 mmol/L (ref 3.5–5.3)
Sodium: 141 mmol/L (ref 135–146)
Total Bilirubin: 0.6 mg/dL (ref 0.2–1.2)
Total Protein: 7.3 g/dL (ref 6.1–8.1)

## 2020-07-19 LAB — CBC WITH DIFFERENTIAL/PLATELET
Absolute Monocytes: 698 cells/uL (ref 200–950)
Basophils Absolute: 47 cells/uL (ref 0–200)
Basophils Relative: 0.5 %
Eosinophils Absolute: 251 cells/uL (ref 15–500)
Eosinophils Relative: 2.7 %
HCT: 41 % (ref 35.0–45.0)
Hemoglobin: 14.1 g/dL (ref 11.7–15.5)
Lymphs Abs: 3246 cells/uL (ref 850–3900)
MCH: 31.4 pg (ref 27.0–33.0)
MCHC: 34.4 g/dL (ref 32.0–36.0)
MCV: 91.3 fL (ref 80.0–100.0)
MPV: 10.1 fL (ref 7.5–12.5)
Monocytes Relative: 7.5 %
Neutro Abs: 5059 cells/uL (ref 1500–7800)
Neutrophils Relative %: 54.4 %
Platelets: 253 10*3/uL (ref 140–400)
RBC: 4.49 10*6/uL (ref 3.80–5.10)
RDW: 12.6 % (ref 11.0–15.0)
Total Lymphocyte: 34.9 %
WBC: 9.3 10*3/uL (ref 3.8–10.8)

## 2020-07-19 LAB — LIPID PANEL
Cholesterol: 184 mg/dL (ref <200)
HDL: 50 mg/dL (ref >=50)
LDL Cholesterol (Calc): 108 mg/dL (calc) — ABNORMAL HIGH
Non-HDL Cholesterol (Calc): 134 mg/dL (calc) — ABNORMAL HIGH (ref <130)
Total CHOL/HDL Ratio: 3.7 (calc) (ref <5.0)
Triglycerides: 149 mg/dL (ref <150)

## 2020-07-19 LAB — URINALYSIS, ROUTINE W REFLEX MICROSCOPIC
Bilirubin Urine: NEGATIVE
Glucose, UA: NEGATIVE
Hgb urine dipstick: NEGATIVE
Ketones, ur: NEGATIVE
Leukocytes,Ua: NEGATIVE
Nitrite: NEGATIVE
Protein, ur: NEGATIVE
Specific Gravity, Urine: 1.016 (ref 1.001–1.035)
pH: 5.5 (ref 5.0–8.0)

## 2020-07-19 LAB — TSH: TSH: 3.53 mIU/L (ref 0.40–4.50)

## 2020-07-19 LAB — HEMOGLOBIN A1C
Hgb A1c MFr Bld: 7.2 % of total Hgb — ABNORMAL HIGH (ref <5.7)
Mean Plasma Glucose: 160 mg/dL
eAG (mmol/L): 8.9 mmol/L

## 2020-07-19 LAB — VITAMIN D 25 HYDROXY (VIT D DEFICIENCY, FRACTURES): Vit D, 25-Hydroxy: 74 ng/mL (ref 30–100)

## 2020-07-19 LAB — VITAMIN B12: Vitamin B-12: 395 pg/mL (ref 200–1100)

## 2020-08-20 ENCOUNTER — Other Ambulatory Visit: Payer: Self-pay | Admitting: Adult Health

## 2020-09-01 ENCOUNTER — Ambulatory Visit: Payer: 59 | Admitting: Adult Health

## 2020-10-16 ENCOUNTER — Other Ambulatory Visit: Payer: Self-pay | Admitting: Internal Medicine

## 2020-10-16 DIAGNOSIS — I10 Essential (primary) hypertension: Secondary | ICD-10-CM

## 2020-11-11 LAB — HM MAMMOGRAPHY

## 2020-11-16 ENCOUNTER — Encounter: Payer: Self-pay | Admitting: Internal Medicine

## 2020-11-24 ENCOUNTER — Other Ambulatory Visit: Payer: Self-pay

## 2020-11-24 ENCOUNTER — Encounter: Payer: Self-pay | Admitting: Adult Health

## 2020-11-24 ENCOUNTER — Ambulatory Visit: Payer: 59 | Admitting: Adult Health

## 2020-11-24 VITALS — BP 138/74 | HR 73 | Temp 97.3°F | Wt 196.0 lb

## 2020-11-24 DIAGNOSIS — E559 Vitamin D deficiency, unspecified: Secondary | ICD-10-CM

## 2020-11-24 DIAGNOSIS — I700 Atherosclerosis of aorta: Secondary | ICD-10-CM | POA: Diagnosis not present

## 2020-11-24 DIAGNOSIS — E1169 Type 2 diabetes mellitus with other specified complication: Secondary | ICD-10-CM

## 2020-11-24 DIAGNOSIS — F3341 Major depressive disorder, recurrent, in partial remission: Secondary | ICD-10-CM

## 2020-11-24 DIAGNOSIS — E66811 Obesity, class 1: Secondary | ICD-10-CM

## 2020-11-24 DIAGNOSIS — Z79899 Other long term (current) drug therapy: Secondary | ICD-10-CM

## 2020-11-24 DIAGNOSIS — E538 Deficiency of other specified B group vitamins: Secondary | ICD-10-CM

## 2020-11-24 DIAGNOSIS — Z86010 Personal history of colonic polyps: Secondary | ICD-10-CM

## 2020-11-24 DIAGNOSIS — I10 Essential (primary) hypertension: Secondary | ICD-10-CM | POA: Diagnosis not present

## 2020-11-24 DIAGNOSIS — M8580 Other specified disorders of bone density and structure, unspecified site: Secondary | ICD-10-CM

## 2020-11-24 DIAGNOSIS — N182 Chronic kidney disease, stage 2 (mild): Secondary | ICD-10-CM

## 2020-11-24 DIAGNOSIS — N181 Chronic kidney disease, stage 1: Secondary | ICD-10-CM

## 2020-11-24 DIAGNOSIS — Z860101 Personal history of adenomatous and serrated colon polyps: Secondary | ICD-10-CM

## 2020-11-24 DIAGNOSIS — J439 Emphysema, unspecified: Secondary | ICD-10-CM

## 2020-11-24 DIAGNOSIS — E1122 Type 2 diabetes mellitus with diabetic chronic kidney disease: Secondary | ICD-10-CM

## 2020-11-24 DIAGNOSIS — E669 Obesity, unspecified: Secondary | ICD-10-CM

## 2020-11-24 DIAGNOSIS — E785 Hyperlipidemia, unspecified: Secondary | ICD-10-CM

## 2020-11-24 DIAGNOSIS — K5790 Diverticulosis of intestine, part unspecified, without perforation or abscess without bleeding: Secondary | ICD-10-CM

## 2020-11-24 DIAGNOSIS — Z720 Tobacco use: Secondary | ICD-10-CM

## 2020-11-24 MED ORDER — ALPRAZOLAM 0.5 MG PO TABS: ORAL_TABLET | ORAL | 0 refills | Status: DC

## 2020-11-24 NOTE — Progress Notes (Signed)
FOLLOW UP OV  Assessment:    Aortic atherosclerosis ([place]) - Per CT 07/2019 Control blood pressure, cholesterol, glucose, increase exercise.   Essential hypertension - continue medications, DASH diet, exercise and monitor at home. Call if greater than 130/80.    T2DM ([place]) Currently fairly controlled by low dose metformin Education: Reviewed 'ABCs' of diabetes management (respective goals in parentheses):  A1C (<7), blood pressure (<130/80), and cholesterol (LDL <70) Eye Exam yearly and Dental Exam every 6 months. She will schedule diabetic eye exam with Dr. Delman Cheadle Foot exam completed Dietary recommendations Physical Activity recommendations       -     A1C   CKD stage 2 due to type 2 diabetes mellitus [facility]) Discussed general issues about diabetes pathophysiology and management., Educational material distributed., Suggested low cholesterol diet., Encouraged aerobic exercise., Discussed foot care., Reminded to get yearly retinal exam. -     CMP/GFR -     UA/ Microalbumin   Hyperlipidemia associated with T2DM ([place]) Did tolerat rosuvastatin 5 mg twice weekly, zetia 10 mg daily but non-compliant; discussed risks, benefits, barriers; receptive to restarting current plan LDL goal <70 -continue medications, check lipids, decrease fatty foods, increase activity.  -     Lipid panel -     TSH   Tobacco use (30+ years, current smoker)  Actively cutting back on smoking, wellbutrin helpful, declines chantix She is reducing gradually Continue annual low dose screening CT - order placed and pending    Emphysema ([place]) Per imaging; STOP SMOKING Denies notable sx, no inhalers, monitoring  Vitamin D deficiency Continue supplement    Osteopenia - get dexa q2y, continue Vit D and Ca, weight bearing exercises  Anxiety Well managed by current regimen; continue medications Stress management techniques discussed, increase water, good sleep hygiene discussed, increase exercise, and increase  veggies.    Diverticulosis Encourage high fiber diet;  History of adenomatous colon polyps Overdue colonoscopy, Dr. Havery Moros, patient has number to schedule Declines referral today   Family history of AAA [place] screening negative for AAA 08/2019, recent chest CT negative for aortic dilation   Encounter for long-term (current) use of medications -     Magnesium  Obesity- BMI 31 Recommended diet heavy in fruits and veggies and low in animal meats, cheeses, and dairy products, appropriate calorie intake Exercise program once retired.  Discussed appropriate weight for height  Follow up at next visit   Orders Placed This Encounter  Procedures   CBC with Differential/Platelet   COMPLETE METABOLIC PANEL WITH GFR   Magnesium   Lipid panel   TSH   Hemoglobin A1c     Over 40 minutes of exam, counseling, chart review and critical decision making was performed Future Appointments  Date Time Provider [place]  07/18/2021  2:00 PM Liane Comber, NP [facility] None  11/28/2021  4:00 PM Liane Comber, NP [facility] None     Plan:   During the course of the visit the patient was educated and counseled about appropriate screening and preventive services including:   Pneumococcal vaccine  Prevnar 13 Influenza vaccine Td vaccine Screening electrocardiogram Bone densitometry screening Colorectal cancer screening Diabetes screening Glaucoma screening Nutrition counseling  Advanced directives: requested   Subjective:  Joyce Shannon is a 65 y.o. female who presents for follow up. She has Essential hypertension; CKD stage 2 due to type 2 diabetes mellitus ([place]); Major depressive disorder, recurrent episode, in partial remission with anxious distress ([place]); Medication management; Hyperlipidemia associated with type 2 diabetes mellitus (  [place]); Tobacco use (30+ years, current smoker) ; Obesity (BMI 30.0-34.9); Vitamin D deficiency; Type 2 diabetes mellitus ([place]); Family  history of abdominal aortic aneurysm (AAA); Aortic arch atherosclerosis ([place]); Emphysema of lung ([place]); Osteopenia; B12 deficiency; Diverticulosis; and History of adenomatous polyp of colon on their problem list.  Was scheduled for AWV today, reports insurance won't start until Nov, defer to next OV.   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 Will be traveling to see family in [place] and [place].   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 On wellbutrin 150 mg daily and doing well with this, very rare use of xanax, typically rare use in the evening. She reports is sleeping fairly at night, improving, denies SE.    she currently continues to smoke <0.5 pack a day, down from previous 1 pack a day; discussed risks associated with smoking, patient is ready to quit, actively working on cutting down. Wellbutrin has been very helpful. She has 30 pack year history, Ct lung 07/20/2019 showed emphysema. CT for 2022 ordered in system, pending patient has number to schedule.   BMI is Body mass index is 31.16 kg/m., she has been working on diet and exercise, doing HIIT 3 days a week. Eating less meat, has a garden.  Wt Readings from Last 3 Encounters:  11/24/20 196 lb (88.9 kg)  07/18/20 189 lb (85.7 kg)  11/25/19 199 lb 6.4 oz (90.4 kg)    Her blood pressure has been controlled at home, today their BP is BP: 138/74 She does workout. She denies chest pain, shortness of breath, dizziness.   Aortic atherosclerosis per chest CT 07/2019  She is on cholesterol medication (was on rosuvastatin 5 mg daily, had myalgia, did tolerate twice a week, on zeita 10 mg daily) and denies myalgias - admits has been off of  med for several months, receptive to restart. Her cholesterol is not at goal. The cholesterol last visit was:   Lab Results  Component Value Date   CHOL 184 07/18/2020   HDL 50 07/18/2020   LDLCALC 108 (H) 07/18/2020   TRIG 149 07/18/2020   CHOLHDL 3.7 07/18/2020    She has been working on diet and exercise for T2 diabetes (on metformin 500 mg daily with largest meal, doesn't take 3 as prescribed), on bASA, statin, ACEi and denies increased appetite, nausea, paresthesia of the feet, polydipsia and polyuria. Last A1C in the office was:  Lab Results  Component Value Date   HGBA1C 7.2 (H) 07/18/2020   She has CKD II associated with T2DM monitored at this office. She is on ACEi. Last GFR: Lab Results  Component Value Date   GFRNONAA 68 07/18/2020   Patient is on Vitamin D supplement, was off at last visit, reports has been taking ? 5000 IU daily  Lab Results  Component Value Date   VD25OH 70 07/18/2020     She is on a B complex since last year  Lab Results  Component Value Date   VITAMINB12 395 07/18/2020      Medication Review: Current Outpatient Medications on File Prior to Visit  Medication Sig Dispense Refill   amLODipine-benazepril (LOTREL) 10-40 MG capsule TAKE 1 CAPSULE DAILY  FOR BLOOD PRESSURE 90 capsule 3   buPROPion (WELLBUTRIN XL) 150 MG 24 hr tablet TAKE 1 TABLET BY MOUTH DAILY FOR MOOD 90 tablet 3   Cyanocobalamin (B-12 PO) Take by mouth daily.     fexofenadine (ALLEGRA) 180 MG tablet Take 180 mg by mouth daily.     fluticasone (FLONASE) 50 MCG/ACT nasal spray Place 1 spray into both nostrils 2 (two) times daily as needed.     metFORMIN (GLUCOPHAGE-XR) 500 MG 24 hr tablet TAKE 1 TABLET 3 TIMES A DAY WITH MEALS FOR DIABETES 270 tablet 1   vitamin C (ASCORBIC ACID) 500 MG tablet Take 500 mg by mouth daily.     VITAMIN D, CHOLECALCIFEROL, PO Take 5,000 Units by mouth.      aspirin EC 81 MG tablet Take 81 mg by mouth daily. (Patient not taking: No sig reported)      ezetimibe (ZETIA) 10 MG tablet TAKE 1 TABLET BY MOUTH EVERY DAY FOR CHOLESTEROL (Patient not taking: No sig reported) 90 tablet 1   rosuvastatin (CRESTOR) 5 MG tablet Take 1 tab once to twice a week as tolerated for cholesterol. (Patient not taking: No sig reported) 30 tablet 3   No current facility-administered medications on file prior to visit.    Allergies  Allergen Reactions   Other     Tylenol #3   Tylox [Oxycodone-Acetaminophen]     Current Problems (verified) Patient Active Problem List   Diagnosis Date Noted   B12 deficiency 07/16/2020   Diverticulosis 07/16/2020   History of adenomatous polyp of colon 07/16/2020   Osteopenia 11/02/2019   Aortic arch atherosclerosis ([place]) 07/21/2019   Emphysema of lung ([place]) 07/21/2019   Family history of abdominal aortic aneurysm (AAA) 04/22/2018   Type 2 diabetes mellitus ([place]) 10/04/2017   Obesity (BMI 30.0-34.9) 06/21/2014   Vitamin D deficiency 06/21/2014   CKD stage 2 due to type 2 diabetes mellitus ([place]) 01/07/2014   Major depressive disorder, recurrent episode, in partial remission with anxious distress ([place]) 01/07/2014   Medication management 01/07/2014   Hyperlipidemia associated with type 2 diabetes mellitus ([place]) 01/07/2014   Tobacco use (30+ years, current smoker)  01/07/2014   Essential hypertension 01/22/2012    Screening Tests Immunization History  Administered Date(s) Administered   Fluad Quad(high Dose 65+) 10/19/2020   Influenza, High Dose Seasonal PF 11/03/2019   Influenza-Unspecified 10/28/2014, 11/06/2018   PFIZER(Purple Top)SARS-COV-2 Vaccination 03/21/2019, 04/15/2019, 11/26/2019   Pneumococcal Conjugate-13 08/20/2019   Pneumococcal Polysaccharide-23 03/19/2016   Td 10/07/2003   Tdap 03/19/2016   Tetanus: 2018 Pneumovax: 2018 Prevnar 13:  2021 Flu vaccine: 2022 at work  Shingrix: Check with insurance Covid 66 2/2 2021, pfizer + booster - will get new booster and forward date  LMP s/p  hysterectomy Pap: 2008 before hysterectomy, DONE [facility]: 08/31/2019, will call to schedule  DEXA: 10/2019 T score -1.9  Colonoscopy:12/2015 numerous polyps, Dr. Havery Moros WAS DUE 12/2018 - was referred back, patient preference to postpone due to travel  CT lungs 07/2019, will schedule CT - has number to schedule  AAA [place]: normal 08/2019  Names of Other Physician/Practitioners you currently use: 1. [facility] here for primary care 2. [facility], eye doctor, last visit 07/01/2019 abstracted - will call to schedule  3. Dr. Gilford Rile, dentist, last visit 2021, reminded to schedule   Patient Care Team: Unk Pinto, MD as PCP - General (Internal Medicine) Armbruster, Carlota Raspberry, MD as Consulting Physician (Gastroenterology)  SURGICAL HISTORY She  has  a past surgical history that includes Cesarean section; Abdominal hysterectomy; Cholecystectomy; ovarian cystectomy (1985); and Tonsillectomy. FAMILY HISTORY Her family history includes AAA (abdominal aortic aneurysm) in her maternal grandfather; Asthma in her daughter; Breast cancer (age of onset: 33) in her mother; Congestive Heart Failure in her father; Diabetes in her brother and mother; Heart attack (age of onset: 73) in her father; Heart disease in her father and mother; Hypertension in her father; Multiple sclerosis in her brother; Prostate cancer (age of onset: 13) in her father. SOCIAL HISTORY She  reports that she has been smoking cigarettes. She started smoking about 42 years ago. She has a 30.75 pack-year smoking history. She has never used smokeless tobacco. She reports that she does not drink alcohol and does not use drugs.   Review of Systems  Constitutional:  Negative for malaise/fatigue and weight loss.  HENT:  Negative for hearing loss and tinnitus.   Eyes:  Negative for blurred vision and double vision.  Respiratory:  Negative for cough, sputum production, shortness of breath and wheezing.    Cardiovascular:  Negative for chest pain, palpitations, orthopnea, claudication, leg swelling and PND.  Gastrointestinal:  Negative for abdominal pain, blood in stool, constipation, diarrhea, heartburn, melena, nausea and vomiting.  Genitourinary: Negative.   Musculoskeletal:  Negative for falls, joint pain and myalgias.  Skin:  Negative for rash.  Neurological:  Negative for dizziness, tingling, sensory change, weakness and headaches.  Endo/Heme/Allergies:  Negative for polydipsia.  Psychiatric/Behavioral: Negative.  Negative for depression, memory loss, substance abuse and suicidal ideas. The patient is not nervous/anxious and does not have insomnia.   All other systems reviewed and are negative.   Objective:     Today's Vitals   11/24/20 1533 11/24/20 1621  BP: (!) 150/82 138/74  Pulse: 73   Temp: (!) 97.3 F (36.3 C)   SpO2: 99%   Weight: 196 lb (88.9 kg)    Body mass index is 31.16 kg/m.  General appearance: alert, no distress, WD/WN, female HEENT: normocephalic, sclerae anicteric, TMs pearly, nares patent, no discharge or erythema, pharynx normal Oral cavity: MMM, good dentition, no lesions Neck: supple, no lymphadenopathy, no thyromegaly, no masses Heart: RRR, normal S1, S2, no murmurs Lungs: CTA bilaterally, no wheezes, rhonchi, or rales Abdomen: +bs, soft, non tender, non distended, no masses, no hepatomegaly, no splenomegaly Musculoskeletal: nontender, no swelling, no obvious deformity. Symmetrical ROM. Steady non-antalgic gait Extremities: no edema, no cyanosis, no clubbing Pulses: 2+ symmetric, upper and lower extremities, normal cap refill Neurological: alert, oriented x 3, CN2-12 intact, strength normal upper extremities and lower extremities, sensation normal throughout, DTRs 2+ throughout, no cerebellar signs, gait normal Psychiatric: normal affect, behavior normal, pleasant  Skin: warm, dry, intact; no rashes or concerning nevi, no ecchymoses. R temple with  well healed surgical scar.     Izora Ribas, NP   11/24/2020

## 2020-11-25 LAB — CBC WITH DIFFERENTIAL/PLATELET
Absolute Monocytes: 818 cells/uL (ref 200–950)
Basophils Absolute: 51 cells/uL (ref 0–200)
Basophils Relative: 0.5 %
Eosinophils Absolute: 273 cells/uL (ref 15–500)
Eosinophils Relative: 2.7 %
HCT: 40.4 % (ref 35.0–45.0)
Hemoglobin: 14 g/dL (ref 11.7–15.5)
Lymphs Abs: 3394 cells/uL (ref 850–3900)
MCH: 31.9 pg (ref 27.0–33.0)
MCHC: 34.7 g/dL (ref 32.0–36.0)
MCV: 92 fL (ref 80.0–100.0)
MPV: 11.1 fL (ref 7.5–12.5)
Monocytes Relative: 8.1 %
Neutro Abs: 5565 cells/uL (ref 1500–7800)
Neutrophils Relative %: 55.1 %
Platelets: 260 10*3/uL (ref 140–400)
RBC: 4.39 10*6/uL (ref 3.80–5.10)
RDW: 12.3 % (ref 11.0–15.0)
Total Lymphocyte: 33.6 %
WBC: 10.1 10*3/uL (ref 3.8–10.8)

## 2020-11-25 LAB — COMPLETE METABOLIC PANEL WITH GFR
AG Ratio: 1.5 (calc) (ref 1.0–2.5)
ALT: 9 U/L (ref 6–29)
AST: 11 U/L (ref 10–35)
Albumin: 4.2 g/dL (ref 3.6–5.1)
Alkaline phosphatase (APISO): 76 U/L (ref 37–153)
BUN: 12 mg/dL (ref 7–25)
CO2: 26 mmol/L (ref 20–32)
Calcium: 10.1 mg/dL (ref 8.6–10.4)
Chloride: 106 mmol/L (ref 98–110)
Creat: 0.82 mg/dL (ref 0.50–1.05)
Globulin: 2.8 g/dL (calc) (ref 1.9–3.7)
Glucose, Bld: 122 mg/dL — ABNORMAL HIGH (ref 65–99)
Potassium: 4.1 mmol/L (ref 3.5–5.3)
Sodium: 143 mmol/L (ref 135–146)
Total Bilirubin: 0.3 mg/dL (ref 0.2–1.2)
Total Protein: 7 g/dL (ref 6.1–8.1)
eGFR: 79 mL/min/{1.73_m2} (ref >=60)

## 2020-11-25 LAB — LIPID PANEL
Cholesterol: 180 mg/dL (ref <200)
HDL: 40 mg/dL — ABNORMAL LOW (ref >=50)
LDL Cholesterol (Calc): 98 mg/dL (calc)
Non-HDL Cholesterol (Calc): 140 mg/dL (calc) — ABNORMAL HIGH (ref <130)
Total CHOL/HDL Ratio: 4.5 (calc) (ref <5.0)
Triglycerides: 291 mg/dL — ABNORMAL HIGH (ref <150)

## 2020-11-25 LAB — MAGNESIUM: Magnesium: 2 mg/dL (ref 1.5–2.5)

## 2020-11-25 LAB — TSH: TSH: 2.97 mIU/L (ref 0.40–4.50)

## 2020-11-25 LAB — HEMOGLOBIN A1C
Hgb A1c MFr Bld: 7.2 % of total Hgb — ABNORMAL HIGH (ref <5.7)
Mean Plasma Glucose: 160 mg/dL
eAG (mmol/L): 8.9 mmol/L

## 2021-01-09 IMAGING — MG DIGITAL SCREENING BILAT W/ CAD
5 series · 5 of 5 positions shown · non-contrast
Comparison: Previous exam(s).

CLINICAL DATA: Screening.

EXAM:
DIGITAL SCREENING BILATERAL MAMMOGRAM WITH CAD

[R CC (1 of 2)]
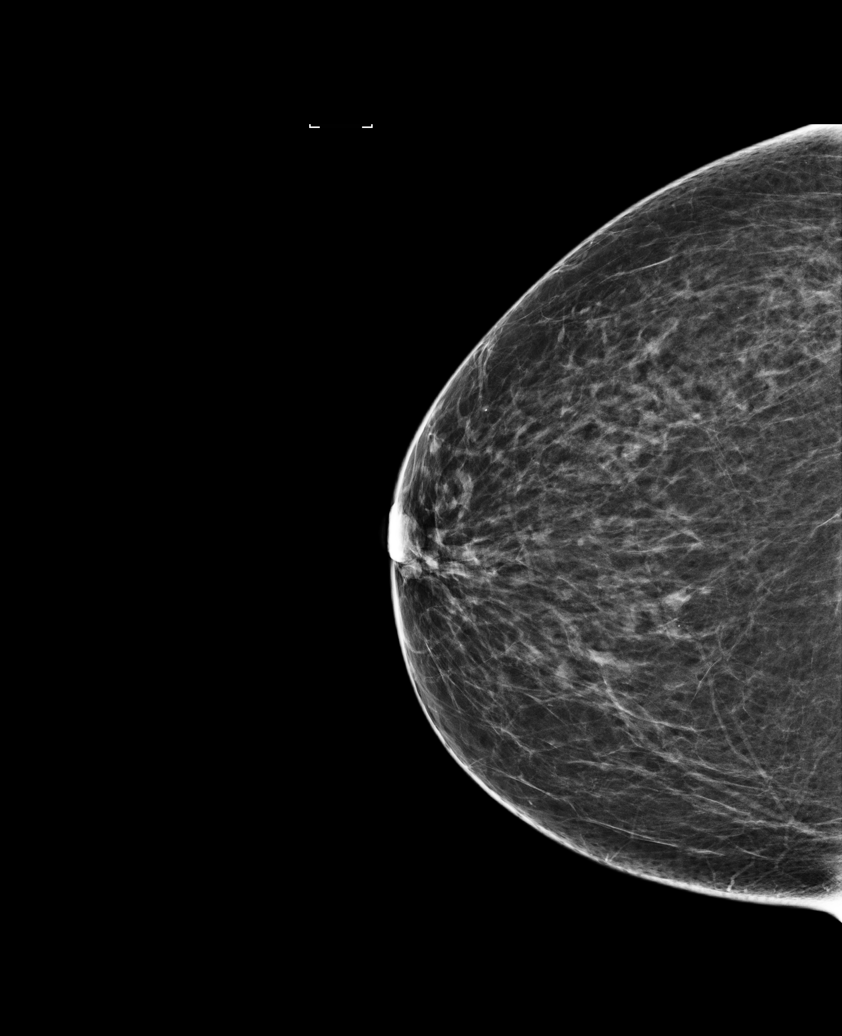

[R MLO]
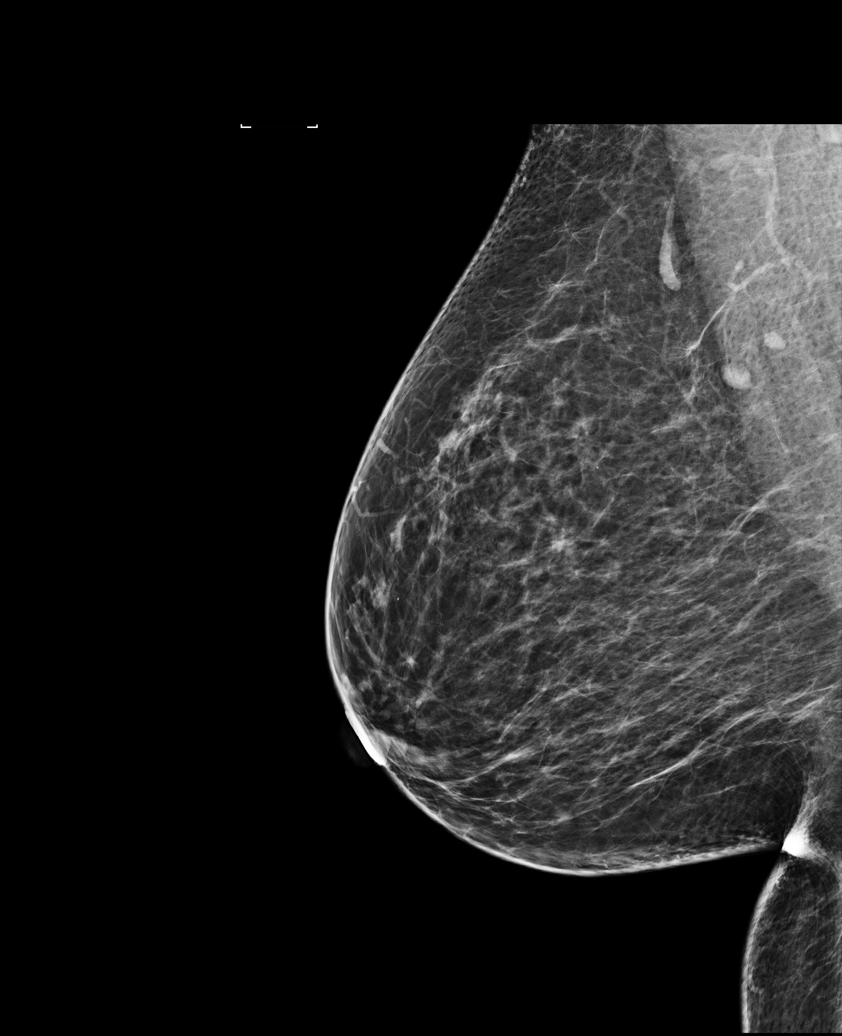

[R CC (2 of 2)]
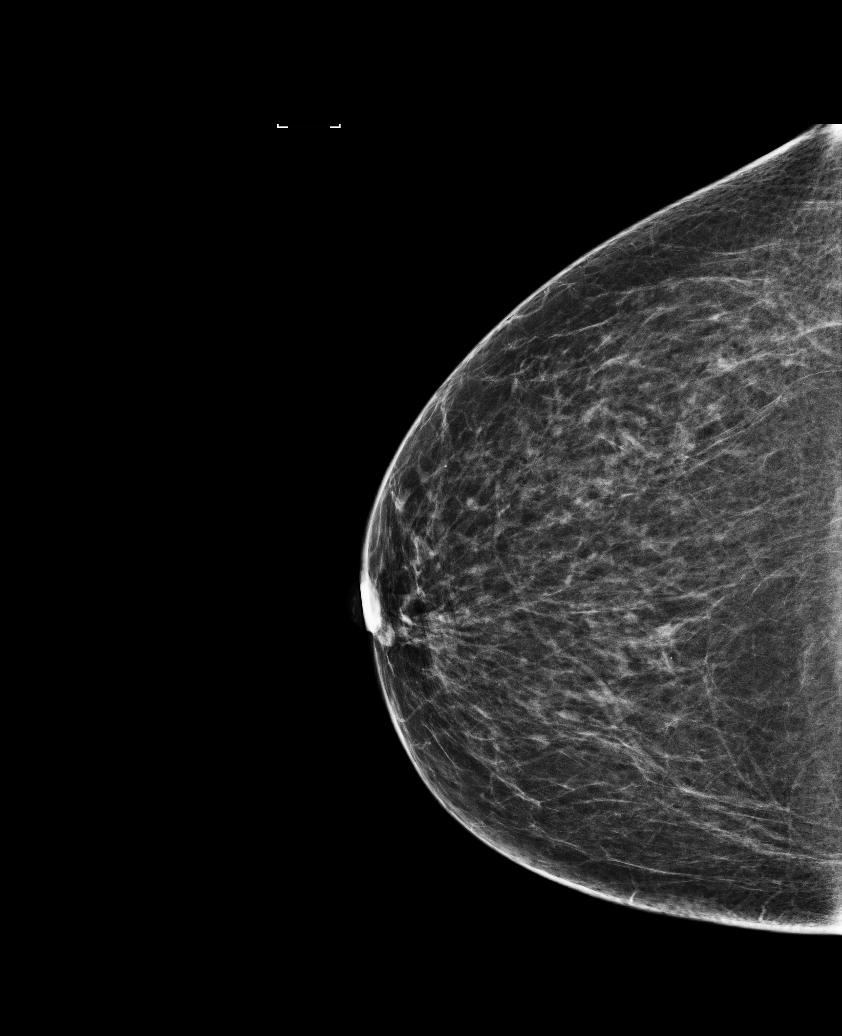

[L CC]
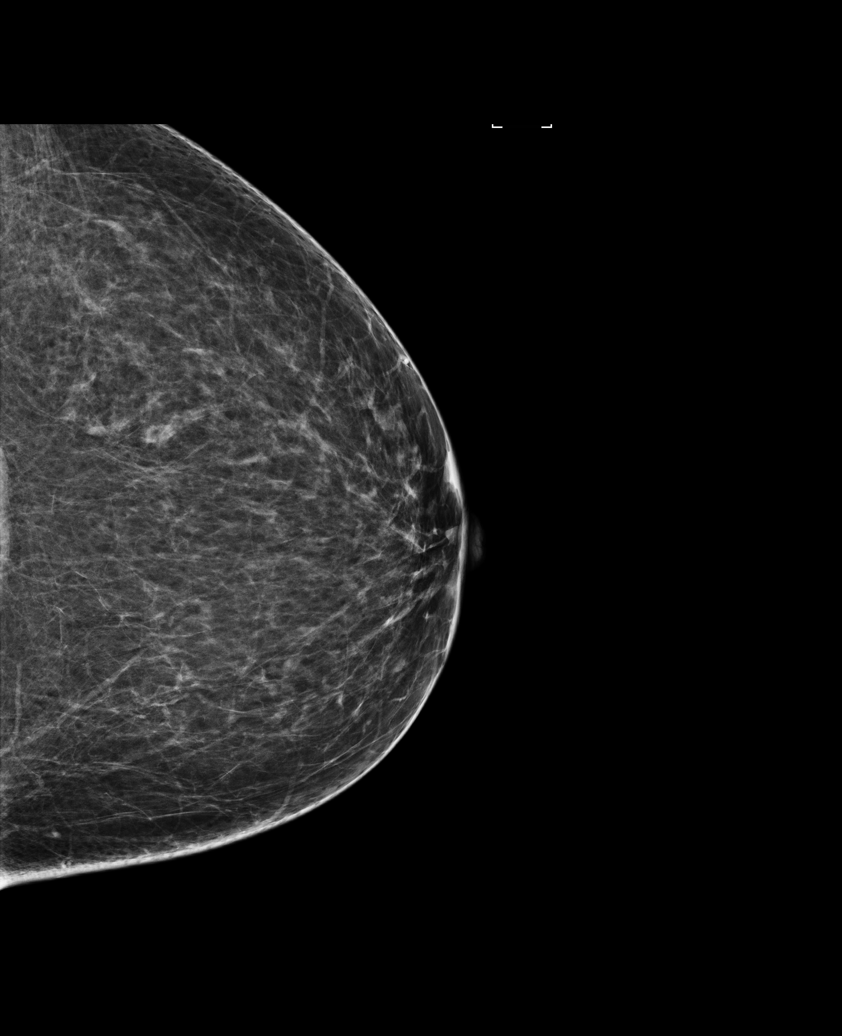

[L MLO]
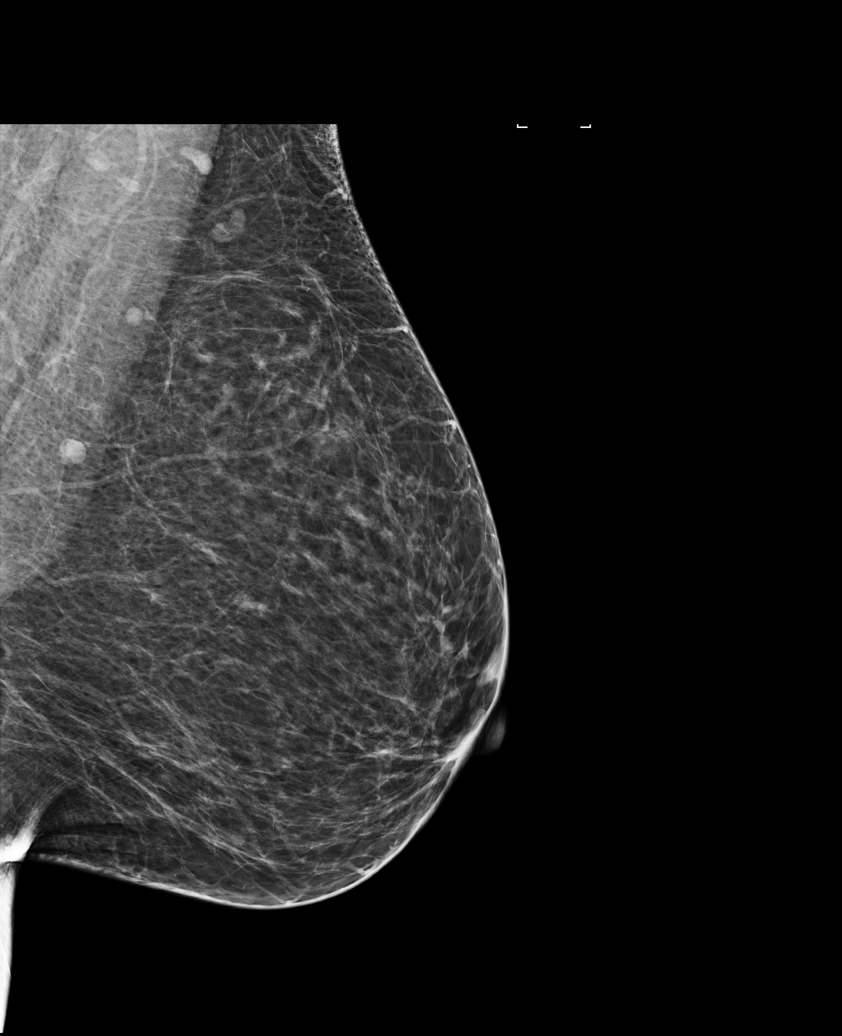

[5 of 5 positions shown; findings below may reference images not displayed]

ACR Breast Density Category b: There are scattered areas of
fibroglandular density.
FINDINGS: There are no findings suspicious for malignancy. Images were
processed with CAD.
IMPRESSION: No mammographic evidence of malignancy. A result letter of this
screening mammogram will be mailed directly to the patient.

RECOMMENDATION:
Screening mammogram in one year. (Code:AS-G-LCT)

BI-RADS CATEGORY  1: Negative.

## 2021-01-23 ENCOUNTER — Telehealth: Payer: Self-pay

## 2021-01-23 NOTE — Telephone Encounter (Signed)
Patient feels that her depression is getting worse. Requesting a recommendation to a psychologist. Please advise.

## 2021-03-10 NOTE — Progress Notes (Deleted)
AWV AND FOLLOW UP OV  Assessment:   Annual Medicare Wellness Visit Due annually  Health maintenance reviewed  ***  Aortic atherosclerosis ([place]) - Per CT 07/2019 Control blood pressure, cholesterol, glucose, increase exercise.   Essential hypertension - continue medications, DASH diet, exercise and monitor at home. Call if greater than 130/80.    T2DM ([place]) Currently fairly controlled by low dose metformin Education: Reviewed ABCs of diabetes management (respective goals in parentheses):  A1C (<7), blood pressure (<130/80), and cholesterol (LDL <70) Eye Exam yearly and Dental Exam every 6 months. She will schedule diabetic eye exam with Dr. Delman Cheadle Foot exam completed Dietary recommendations Physical Activity recommendations       -     A1C   CKD stage 2 due to type 2 diabetes mellitus [facility]) Discussed general issues about diabetes pathophysiology and management., Educational material distributed., Suggested low cholesterol diet., Encouraged aerobic exercise., Discussed foot care., Reminded to get yearly retinal exam. -     CMP/GFR -     UA/ Microalbumin   Hyperlipidemia associated with T2DM ([place]) Did tolerat rosuvastatin 5 mg twice weekly, zetia 10 mg daily but non-compliant; discussed risks, benefits, barriers; receptive to restarting current plan LDL goal <70 -continue medications, check lipids, decrease fatty foods, increase activity.  -     Lipid panel -     TSH   Tobacco use (30+ years, current smoker)  Actively cutting back on smoking, wellbutrin helpful, declines chantix She is reducing gradually Continue annual low dose screening CT - order placed and pending    Emphysema ([place]) Per imaging; STOP SMOKING Denies notable sx, no inhalers, monitoring  Vitamin D deficiency Continue supplement    Osteopenia - get dexa q2y, continue Vit D and Ca, weight bearing exercises  Anxiety Well managed by current regimen; continue medications Stress management techniques  discussed, increase water, good sleep hygiene discussed, increase exercise, and increase veggies.    Diverticulosis Encourage high fiber diet;  History of adenomatous colon polyps Overdue colonoscopy, Dr. Havery Moros, patient has number to schedule Declines referral today   Family history of AAA [place] screening negative for AAA 08/2019, recent chest CT negative for aortic dilation   Encounter for long-term (current) use of medications -     Magnesium  Obesity- BMI 31 Recommended diet heavy in fruits and veggies and low in animal meats, cheeses, and dairy products, appropriate calorie intake Exercise program once retired.  Discussed appropriate weight for height  Follow up at next visit   No orders of the defined types were placed in this encounter.    Over 40 minutes of exam, counseling, chart review and critical decision making was performed Future Appointments  Date Time Provider [place]  03/14/2021  9:30 AM Liane Comber, NP [facility] None  07/18/2021  2:00 PM Liane Comber, NP [facility] None     Plan:   During the course of the visit the patient was educated and counseled about appropriate screening and preventive services including:   Pneumococcal vaccine  Prevnar 13 Influenza vaccine Td vaccine Screening electrocardiogram Bone densitometry screening Colorectal cancer screening Diabetes screening Glaucoma screening Nutrition counseling  Advanced directives: requested   Subjective:  Joyce Shannon is a 66 y.o. female who presents for follow up. She has Essential hypertension; CKD stage 2 due to type 2 diabetes mellitus ([place]); Major depressive disorder, recurrent episode, in partial remission with anxious distress ([place]); Medication management; Hyperlipidemia associated with type 2 diabetes mellitus ([place]); Tobacco use (30+ years, current smoker) ; Obesity (  BMI 30.0-34.9); Vitamin D deficiency; Type 2 diabetes mellitus ([place]); Family history of abdominal  aortic aneurysm (AAA); Aortic arch atherosclerosis ([place]); Emphysema of lung ([place]); Osteopenia; B12 deficiency; Diverticulosis; and History of adenomatous polyp of colon on their problem list.  Was scheduled for AWV today, reports insurance won't start until Nov, defer to next OV.   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 Will be traveling to see family in [place] and [place].   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 On wellbutrin 150 mg daily and doing well with this, very rare use of xanax, typically rare use in the evening. She reports is sleeping fairly at night, improving, denies SE.    she currently continues to smoke <0.5 pack a day, down from previous 1 pack a day; discussed risks associated with smoking, patient is ready to quit, actively working on cutting down. Wellbutrin has been very helpful. She has 30 pack year history, Ct lung 07/20/2019 showed emphysema. CT for 2022 ordered in system, pending patient has number to schedule. ***  BMI is There is no height or weight on file to calculate BMI., she has been working on diet and exercise, doing HIIT 3 days a week. Eating less meat, has a garden.  Wt Readings from Last 3 Encounters:  11/24/20 196 lb (88.9 kg)  07/18/20 189 lb (85.7 kg)  11/25/19 199 lb 6.4 oz (90.4 kg)    Her blood pressure has been controlled at home, today their BP is   She does workout. She denies chest pain, shortness of breath, dizziness.   Aortic atherosclerosis per chest CT 07/2019  She is on cholesterol medication (was on rosuvastatin 5 mg daily, had myalgia, did tolerate twice a week, on zeita 10 mg daily) and denies myalgias - admits has been off of med  for several months, receptive to restart. Her cholesterol is not at goal. The cholesterol last visit was:   Lab Results  Component Value Date   CHOL 180 11/24/2020   HDL 40 (L) 11/24/2020   LDLCALC 98 11/24/2020   TRIG 291 (H) 11/24/2020   CHOLHDL 4.5 11/24/2020    She has been working on diet and exercise for T2 diabetes (on metformin 500 mg daily with largest meal, doesn't take 3 as prescribed), on bASA, statin, ACEi and denies increased appetite, nausea, paresthesia of the feet, polydipsia and polyuria. Last A1C in the office was:  Lab Results  Component Value Date   HGBA1C 7.2 (H) 11/24/2020   She has CKD II associated with T2DM monitored at this office. She is on ACEi. Last GFR: Lab Results  Component Value Date   GFRNONAA 68 07/18/2020   Patient is on Vitamin D supplement, was off at last visit, reports has been taking ? 5000 IU daily  Lab Results  Component Value Date   VD25OH 70 07/18/2020     She is on a B complex since last year  Lab Results  Component Value Date   VITAMINB12 395 07/18/2020      Medication Review: Current Outpatient Medications on File Prior to Visit  Medication Sig Dispense Refill   ALPRAZolam (XANAX) 0.5 MG tablet Take 1/2 to 1 tablet twice daily  as needed for severe anxiety. 30 tablet 0   amLODipine-benazepril (LOTREL) 10-40 MG capsule TAKE 1 CAPSULE DAILY FOR BLOOD PRESSURE 90 capsule 3   aspirin EC 81 MG tablet Take 81 mg by mouth daily. (Patient not taking: No sig reported)     buPROPion (WELLBUTRIN XL) 150 MG 24 hr tablet TAKE 1 TABLET BY MOUTH DAILY FOR MOOD 90 tablet 3   Cyanocobalamin (B-12 PO) Take by mouth daily.     ezetimibe (ZETIA) 10 MG tablet TAKE 1 TABLET BY MOUTH EVERY DAY FOR CHOLESTEROL (Patient not taking: No sig reported) 90 tablet 1   fexofenadine (ALLEGRA) 180 MG tablet Take 180 mg by mouth daily.     fluticasone (FLONASE) 50 MCG/ACT nasal spray Place 1 spray into both nostrils 2 (two) times daily as needed.     metFORMIN  (GLUCOPHAGE-XR) 500 MG 24 hr tablet TAKE 1 TABLET 3 TIMES A DAY WITH MEALS FOR DIABETES 270 tablet 1   rosuvastatin (CRESTOR) 5 MG tablet Take 1 tab once to twice a week as tolerated for cholesterol. (Patient not taking: No sig reported) 30 tablet 3   vitamin C (ASCORBIC ACID) 500 MG tablet Take 500 mg by mouth daily.     VITAMIN D, CHOLECALCIFEROL, PO Take 5,000 Units by mouth.      No current facility-administered medications on file prior to visit.    Allergies  Allergen Reactions   Other     Tylenol #3   Tylox [Oxycodone-Acetaminophen]     Current Problems (verified) Patient Active Problem List   Diagnosis Date Noted   B12 deficiency 07/16/2020   Diverticulosis 07/16/2020   History of adenomatous polyp of colon 07/16/2020   Osteopenia 11/02/2019   Aortic arch atherosclerosis ([place]) 07/21/2019   Emphysema of lung ([place]) 07/21/2019   Family history of abdominal aortic aneurysm (AAA) 04/22/2018   Type 2 diabetes mellitus ([place]) 10/04/2017   Obesity (BMI 30.0-34.9) 06/21/2014   Vitamin D deficiency 06/21/2014   CKD stage 2 due to type 2 diabetes mellitus ([place]) 01/07/2014   Major depressive disorder, recurrent episode, in partial remission with anxious distress ([place]) 01/07/2014   Medication management 01/07/2014   Hyperlipidemia associated with type 2 diabetes mellitus ([place]) 01/07/2014   Tobacco use (30+ years, current smoker)  01/07/2014   Essential hypertension 01/22/2012    Screening Tests Immunization History  Administered Date(s) Administered   Fluad Quad(high Dose 65+) 10/19/2020   Influenza, High Dose Seasonal PF 11/03/2019   Influenza-Unspecified 10/28/2014, 11/06/2018   PFIZER(Purple Top)SARS-COV-2 Vaccination 03/21/2019, 04/15/2019, 11/26/2019   Pneumococcal Conjugate-13 08/20/2019   Pneumococcal Polysaccharide-23 03/19/2016   Td 10/07/2003   Tdap 03/19/2016   Health Maintenance  Topic Date Due   Zoster Vaccines- Shingrix (1 of 2) Never done   COLONOSCOPY (Pts  45-76yrs Insurance coverage will need to be confirmed)  12/07/2018   COVID-19 Vaccine (4 - Booster for Pfizer series) 01/21/2020   OPHTHALMOLOGY EXAM  06/30/2020   Pneumonia Vaccine 53+ Years old (3 - PPSV23 if available, else PCV20) 03/19/2021   HEMOGLOBIN A1C  05/25/2021   FOOT EXAM  07/18/2021   MAMMOGRAM  11/12/2022   TETANUS/TDAP  03/19/2026   INFLUENZA VACCINE  Completed   DEXA SCAN  Completed   Hepatitis C Screening  Completed   HPV VACCINES  Aged Out     Tetanus: 2018 Pneumovax: 2018 Prevnar 13:  2021 Flu vaccine: 2022 at work  Shingrix: Check with insurance Covid 17 2/2 2021, pfizer + booster - will get new booster and  forward date  LMP s/p hysterectomy Pap: 2008 before hysterectomy, DONE [employer]: Gets annually at *** DEXA: 10/2019 T score -1.9  Colonoscopy:12/2015 numerous polyps, Dr. Havery Moros WAS DUE 12/2018 - was referred back, patient preference to postpone due to travel and never scheduled***  CT lungs 07/2019, overdue, will schedule CT - has number to schedule  AAA [place]: normal 08/2019  Names of Other Physician/Practitioners you currently use: 1. [facility] here for primary care 2. [facility], eye doctor, last visit 07/01/2019 abstracted - will call to schedule  3. Dr. Gilford Rile, dentist, last visit 2021, reminded to schedule   Patient Care Team: Unk Pinto, MD as PCP - General (Internal Medicine) Armbruster, Carlota Raspberry, MD as Consulting Physician (Gastroenterology)  SURGICAL HISTORY She  has a past surgical history that includes Cesarean section; Abdominal hysterectomy; Cholecystectomy; ovarian cystectomy (1985); and Tonsillectomy. FAMILY HISTORY Her family history includes AAA (abdominal aortic aneurysm) in her maternal grandfather; Asthma in her daughter; Breast cancer (age of onset: 77) in her mother; Congestive Heart Failure in her father; Diabetes in her brother and mother; Heart attack (age of onset: 27) in her  father; Heart disease in her father and mother; Hypertension in her father; Multiple sclerosis in her brother; Prostate cancer (age of onset: 45) in her father. SOCIAL HISTORY She  reports that she has been smoking cigarettes. She started smoking about 42 years ago. She has a 30.75 pack-year smoking history. She has never used smokeless tobacco. She reports that she does not drink alcohol and does not use drugs.  MEDICARE WELLNESS OBJECTIVES: Physical activity:   Cardiac risk factors:   Depression/mood screen:   Depression screen [facility] 2/9 07/18/2020  Decreased Interest 0  Down, Depressed, Hopeless 1  PHQ - 2 Score 1  Altered sleeping 1  Tired, decreased energy 0  Change in appetite 0  Feeling bad or failure about yourself  0  Trouble concentrating 0  Moving slowly or fidgety/restless 0  Suicidal thoughts 0  PHQ-9 Score 2  Difficult doing work/chores Not difficult at all    ADLs:  No flowsheet data found.   Cognitive Testing  Alert? Yes  Normal Appearance?Yes  Oriented to person? Yes  Place? Yes   Time? Yes  Recall of three objects?  Yes  Can perform simple calculations? Yes  Displays appropriate judgment?Yes  Can read the correct time from a watch face?Yes  EOL planning:       Review of Systems  Constitutional:  Negative for malaise/fatigue and weight loss.  HENT:  Negative for hearing loss and tinnitus.   Eyes:  Negative for blurred vision and double vision.  Respiratory:  Negative for cough, sputum production, shortness of breath and wheezing.   Cardiovascular:  Negative for chest pain, palpitations, orthopnea, claudication, leg swelling and PND.  Gastrointestinal:  Negative for abdominal pain, blood in stool, constipation, diarrhea, heartburn, melena, nausea and vomiting.  Genitourinary: Negative.   Musculoskeletal:  Negative for falls, joint pain and myalgias.  Skin:  Negative for rash.  Neurological:  Negative for dizziness, tingling, sensory change, weakness and  headaches.  Endo/Heme/Allergies:  Negative for polydipsia.  Psychiatric/Behavioral: Negative.  Negative for depression, memory loss, substance abuse and suicidal ideas. The patient is not nervous/anxious and does not have insomnia.   All other systems reviewed and are negative.   Objective:     There were no vitals filed for this visit.  There is no height or weight on file to calculate BMI.  General  appearance: alert, no distress, WD/WN, female HEENT: normocephalic, sclerae anicteric, TMs pearly, nares patent, no discharge or erythema, pharynx normal Oral cavity: MMM, good dentition, no lesions Neck: supple, no lymphadenopathy, no thyromegaly, no masses Heart: RRR, normal S1, S2, no murmurs Lungs: CTA bilaterally, no wheezes, rhonchi, or rales Abdomen: +bs, soft, non tender, non distended, no masses, no hepatomegaly, no splenomegaly Musculoskeletal: nontender, no swelling, no obvious deformity. Symmetrical ROM. Steady non-antalgic gait Extremities: no edema, no cyanosis, no clubbing Pulses: 2+ symmetric, upper and lower extremities, normal cap refill Neurological: alert, oriented x 3, CN2-12 intact, strength normal upper extremities and lower extremities, sensation normal throughout, DTRs 2+ throughout, no cerebellar signs, gait normal Psychiatric: normal affect, behavior normal, pleasant  Skin: warm, dry, intact; no rashes or concerning nevi, no ecchymoses. R temple with well healed surgical scar.    Medicare Attestation I have personally reviewed: The patient's medical and social history Their use of alcohol, tobacco or illicit drugs Their current medications and supplements The patient's functional ability including ADLs,fall risks, home safety risks, cognitive, and hearing and visual impairment Diet and physical activities Evidence for depression or mood disorders  The patient's weight, height, BMI, and visual acuity have been recorded in the chart.  I have made referrals,  counseling, and provided education to the patient based on review of the above and I have provided the patient with a written personalized care plan for preventive services.     Izora Ribas, NP   03/10/2021

## 2021-03-14 ENCOUNTER — Ambulatory Visit: Payer: BC Managed Care – PPO | Admitting: Adult Health

## 2021-03-14 ENCOUNTER — Other Ambulatory Visit: Payer: Self-pay

## 2021-03-14 DIAGNOSIS — I700 Atherosclerosis of aorta: Secondary | ICD-10-CM

## 2021-03-14 DIAGNOSIS — J439 Emphysema, unspecified: Secondary | ICD-10-CM

## 2021-03-14 DIAGNOSIS — E1169 Type 2 diabetes mellitus with other specified complication: Secondary | ICD-10-CM

## 2021-03-14 DIAGNOSIS — I10 Essential (primary) hypertension: Secondary | ICD-10-CM

## 2021-03-14 DIAGNOSIS — E1122 Type 2 diabetes mellitus with diabetic chronic kidney disease: Secondary | ICD-10-CM

## 2021-03-14 DIAGNOSIS — Z79899 Other long term (current) drug therapy: Secondary | ICD-10-CM

## 2021-03-14 DIAGNOSIS — E669 Obesity, unspecified: Secondary | ICD-10-CM

## 2021-03-14 DIAGNOSIS — Z720 Tobacco use: Secondary | ICD-10-CM

## 2021-03-14 DIAGNOSIS — Z0000 Encounter for general adult medical examination without abnormal findings: Secondary | ICD-10-CM

## 2021-03-14 DIAGNOSIS — F3341 Major depressive disorder, recurrent, in partial remission: Secondary | ICD-10-CM

## 2021-03-14 DIAGNOSIS — E559 Vitamin D deficiency, unspecified: Secondary | ICD-10-CM

## 2021-03-17 ENCOUNTER — Other Ambulatory Visit: Payer: Self-pay | Admitting: Adult Health

## 2021-05-13 ENCOUNTER — Other Ambulatory Visit: Payer: Self-pay | Admitting: Adult Health

## 2021-05-23 ENCOUNTER — Other Ambulatory Visit: Payer: Self-pay | Admitting: Adult Health

## 2021-05-23 DIAGNOSIS — E1169 Type 2 diabetes mellitus with other specified complication: Secondary | ICD-10-CM

## 2021-05-23 MED ORDER — ROSUVASTATIN CALCIUM 5 MG PO TABS: ORAL_TABLET | ORAL | 3 refills | Status: DC

## 2021-06-07 ENCOUNTER — Other Ambulatory Visit: Payer: Self-pay | Admitting: Adult Health

## 2021-07-18 ENCOUNTER — Encounter: Payer: Self-pay | Admitting: Adult Health

## 2021-07-18 ENCOUNTER — Ambulatory Visit (INDEPENDENT_AMBULATORY_CARE_PROVIDER_SITE_OTHER): Payer: Medicare Other | Admitting: Adult Health

## 2021-07-18 VITALS — BP 120/64 | HR 77 | Temp 97.1°F | Ht 67.0 in | Wt 194.0 lb

## 2021-07-18 DIAGNOSIS — E1169 Type 2 diabetes mellitus with other specified complication: Secondary | ICD-10-CM

## 2021-07-18 DIAGNOSIS — Z86010 Personal history of colonic polyps: Secondary | ICD-10-CM

## 2021-07-18 DIAGNOSIS — F17210 Nicotine dependence, cigarettes, uncomplicated: Secondary | ICD-10-CM

## 2021-07-18 DIAGNOSIS — I10 Essential (primary) hypertension: Secondary | ICD-10-CM

## 2021-07-18 DIAGNOSIS — Z136 Encounter for screening for cardiovascular disorders: Secondary | ICD-10-CM | POA: Diagnosis not present

## 2021-07-18 DIAGNOSIS — Z1211 Encounter for screening for malignant neoplasm of colon: Secondary | ICD-10-CM

## 2021-07-18 DIAGNOSIS — I83811 Varicose veins of right lower extremities with pain: Secondary | ICD-10-CM | POA: Insufficient documentation

## 2021-07-18 DIAGNOSIS — Z1329 Encounter for screening for other suspected endocrine disorder: Secondary | ICD-10-CM

## 2021-07-18 DIAGNOSIS — M8580 Other specified disorders of bone density and structure, unspecified site: Secondary | ICD-10-CM

## 2021-07-18 DIAGNOSIS — E669 Obesity, unspecified: Secondary | ICD-10-CM

## 2021-07-18 DIAGNOSIS — E2839 Other primary ovarian failure: Secondary | ICD-10-CM

## 2021-07-18 DIAGNOSIS — E559 Vitamin D deficiency, unspecified: Secondary | ICD-10-CM

## 2021-07-18 DIAGNOSIS — J439 Emphysema, unspecified: Secondary | ICD-10-CM

## 2021-07-18 DIAGNOSIS — F17200 Nicotine dependence, unspecified, uncomplicated: Secondary | ICD-10-CM

## 2021-07-18 DIAGNOSIS — E1122 Type 2 diabetes mellitus with diabetic chronic kidney disease: Secondary | ICD-10-CM

## 2021-07-18 DIAGNOSIS — Z122 Encounter for screening for malignant neoplasm of respiratory organs: Secondary | ICD-10-CM

## 2021-07-18 DIAGNOSIS — Z8249 Family history of ischemic heart disease and other diseases of the circulatory system: Secondary | ICD-10-CM

## 2021-07-18 DIAGNOSIS — I700 Atherosclerosis of aorta: Secondary | ICD-10-CM

## 2021-07-18 DIAGNOSIS — F3341 Major depressive disorder, recurrent, in partial remission: Secondary | ICD-10-CM

## 2021-07-18 DIAGNOSIS — Z79899 Other long term (current) drug therapy: Secondary | ICD-10-CM

## 2021-07-18 DIAGNOSIS — Z720 Tobacco use: Secondary | ICD-10-CM

## 2021-07-18 DIAGNOSIS — G720 Drug-induced myopathy: Secondary | ICD-10-CM

## 2021-07-18 DIAGNOSIS — E538 Deficiency of other specified B group vitamins: Secondary | ICD-10-CM

## 2021-07-18 DIAGNOSIS — Z0001 Encounter for general adult medical examination with abnormal findings: Secondary | ICD-10-CM

## 2021-07-18 MED ORDER — NEXLIZET 180-10 MG PO TABS: 1.0000 | ORAL_TABLET | Freq: Every day | ORAL | 3 refills | Status: DC

## 2021-07-18 MED ORDER — ALPRAZOLAM 0.5 MG PO TABS: ORAL_TABLET | ORAL | 0 refills | Status: AC

## 2021-07-18 MED ORDER — BLOOD GLUCOSE MONITOR KIT: PACK | 99 refills | Status: AC

## 2021-07-18 NOTE — Patient Instructions (Signed)
Joyce Shannon , Thank you for taking time to come for your Annual Wellness Visit. I appreciate your ongoing commitment to your health goals. Please review the following plan we discussed and let me know if I can assist you in the future.   These are the goals we discussed:  Goals       Blood Pressure < 130/80      Exercise 3x per week (10-15 min per time)      HEMOGLOBIN A1C < 7.0      LDL CALC < 70      Quit Smoking (pt-stated)      Weight (lb) < 170 lb (77.1 kg)        This is a list of the screening recommended for you and due dates:  Health Maintenance  Topic Date Due   Zoster (Shingles) Vaccine (1 of 2) Never done   Colon Cancer Screening  12/07/2018   Eye exam for diabetics  06/30/2020   Hemoglobin A1C  05/25/2021   COVID-19 Vaccine (4 - Booster for Pfizer series) 08/03/2021*   Flu Shot  09/05/2021   DEXA scan (bone density measurement)  11/01/2021   Complete foot exam   07/19/2022   Mammogram  11/12/2022   Tetanus Vaccine  03/19/2026   Hepatitis C Screening: USPSTF Recommendation to screen - Ages 18-79 yo.  Completed   HPV Vaccine  Aged Out   Pneumonia Vaccine  Discontinued  *Topic was postponed. The date shown is not the original due date.      Know what a healthy weight is for you (roughly BMI <25) and aim to maintain this  Aim for 7+ servings of fruits and vegetables daily  65-80+ fluid ounces of water or unsweet tea for healthy kidneys  Limit to max 1 drink of alcohol per day; avoid smoking/tobacco  Limit animal fats in diet for cholesterol and heart health - choose grass fed whenever available  Avoid highly processed foods, and foods high in saturated/trans fats  Aim for low stress - take time to unwind and care for your mental health  Aim for 150 min of moderate intensity exercise weekly for heart health, and weights twice weekly for bone health  Aim for 7-9 hours of sleep daily      SMOKING CESSATION  [employer]  [phone] for  more information or for a free program for smoking cessation help.   You can call QUIT SMART 1-800-QUIT-NOW for free nicotine patches or replacement therapy- if they are out- keep calling  [facility] Can call for smoking cessation classes, [phone]  If you have a smart phone, please look up Smoke Free app, this will help you stay on track and give you information about money you have saved, life that you have gained back and a ton of more information.     ADVANTAGES OF QUITTING SMOKING Within 20 minutes, blood pressure decreases. Your pulse is at normal level. After 8 hours, carbon monoxide levels in the blood return to normal. Your oxygen level increases. After 24 hours, the chance of having a heart attack starts to decrease. Your breath, hair, and body stop smelling like smoke. After 48 hours, damaged nerve endings begin to recover. Your sense of taste and smell improve. After 72 hours, the body is virtually free of nicotine. Your bronchial tubes relax and breathing becomes easier. After 2 to 12 weeks, lungs can hold more air. Exercise becomes easier and circulation improves. After 1 year, the risk of coronary  heart disease is cut in half. After 5 years, the risk of stroke falls to the same as a nonsmoker. After 10 years, the risk of lung cancer is cut in half and the risk of other cancers decreases significantly. After 15 years, the risk of coronary heart disease drops, usually to the level of a nonsmoker. You will have extra money to spend on things other than cigarettes.      Bempedoic Acid; Ezetimibe Tablets What is this medication? BEMPEDOIC ACID; EZETIMIBE (BEM pe DOE ik AS id; ez ET i mibe) treats high cholesterol. It works by reducing the amount of cholesterol made by your body. It also reduces the amount of cholesterol absorbed from the food you eat. This decreases the amount of bad cholesterol (such as LDL) in your blood. Changes to diet and exercise are  often combined with this medication. This medicine may be used for other purposes; ask your health care provider or pharmacist if you have questions. COMMON BRAND NAME(S): NEXLIZET What should I tell my care team before I take this medication? They need to know if you have any of these conditions: Gout Kidney problems Liver disease Tendon problems An unusual or allergic reaction to bempedoic acid, ezetimibe, other medications, foods, dyes, or preservatives Pregnant or trying to become pregnant Breast-feeding How should I use this medication? Take this medication by mouth. Take it as directed on the prescription label at the same time every day. Do not cut, crush, or chew this medication. Swallow the tablets whole. You can take it with or without food. If it upsets your stomach, take it with food. Keep taking it unless your care team tells you to stop. Take this medication at least 2 hours BEFORE or at least 4 hours AFTER administration of a bile acid sequestrant. Talk to your care team about the use of this medication in children. Special care may be needed. Take this medication by mouth. Take it as directed on the prescription label at the same time every day. Do not cut, crush or chew this medication. Swallow the tablets whole. You can take it with or without food. If it upsets your stomach, take it with food. Keep taking it unless your care team tells you to stop.Take this medication at least 2 hours BEFORE or at least 4 hours AFTER administration of a bile acid sequestrant.Talk to your care team about the use of this medication in children. Special care may be needed. Overdosage: If you think you have taken too much of this medicine contact a poison control center or emergency room at once. NOTE: This medicine is only for you. Do not share this medicine with others. What if I miss a dose? If you miss a dose, take it as soon as you can. If it is almost time for your next dose, take only that  dose. Do not take double or extra doses. What may interact with this medication? Do not take this medication with any of the following: Fenofibrate Gemfibrozil This medication may also interact with the following: Antacids Cyclosporine Other medications to lower cholesterol or triglycerides Pravastatin Simvastatin This list may not describe all possible interactions. Give your health care provider a list of all the medicines, herbs, non-prescription drugs, or dietary supplements you use. Also tell them if you smoke, drink alcohol, or use illegal drugs. Some items may interact with your medicine. What should I watch for while using this medication? Visit your care team for regular checks on your progress. Tell your  care team if your symptoms do not start to get better or if they get worse. Your care team may tell you to stop taking this medication if you develop muscle problems. If your muscle problems do not go away after stopping this medication, contact your care team. Taking this medication is only part of a total heart healthy program. Ask your care team if there are other changes you can make to improve your overall health. What side effects may I notice from receiving this medication? Side effects that you should report to your care team as soon as possible: Allergic reactions--skin rash, itching, hives, swelling of the face, lips, tongue, or throat High uric acid level--severe pain, redness, warmth, or swelling in joints, pain or trouble passing urine, pain in the lower back or sides Joint, muscle, or tendon pain, swelling, or stiffness Side effects that usually do not require medical attention (report to your care team if they continue or are bothersome): Diarrhea Fatigue Muscle spasms Stomach pain This list may not describe all possible side effects. Call your doctor for medical advice about side effects. You may report side effects to FDA at 1-800-FDA-1088. Where should I keep my  medication? Keep out of the reach of children and pets. Store between 15 and 30 degrees C (59 and 86 degrees F). Keep this medication in the original container. Protect from moisture. Keep the container tightly closed. Do not throw out the packet in the container. It keeps the medication dry. Avoid exposure to extreme heat. Get rid of any unused medication after the expiration date. To get rid of medications that are no longer needed or have expired: Take the medication to a medication take-back program. Check with your pharmacy or law enforcement to find a location. If you cannot return the medication, check the label or package insert to see if the medication should be thrown out in the trash or flushed down the toilet. If you are not sure, ask your care team. If it is safe to put it in the trash, empty the medication out of the container. Mix the medication with cat litter, dirt, coffee grounds, or other unwanted substance. Seal the mixture in a bag or container. Put it in the trash. NOTE: This sheet is a summary. It may not cover all possible information. If you have questions about this medicine, talk to your doctor, pharmacist, or health care provider.  2023 [employer]/Gold Standard (2021-03-03 00:00:00)

## 2021-07-18 NOTE — Progress Notes (Signed)
CPE AND FOLLOW UP  Assessment:    Encounter for Annual Physical Exam with abnormal findings Due annually  Health Maintenance reviewed Healthy lifestyle reviewed and goals set - schedule mammogram - colonoscopy referral  - schedule diabetes eye and send report - shingrix discussed; can get at pharmacy   Aortic atherosclerosis ([place])  - Per CT 07/2019 Control blood pressure, cholesterol, glucose, increase exercise.   Essential hypertension - continue medications, DASH diet, exercise and monitor at home. Call if greater than 130/80.  -     CBC with Differential/Platelet -     CMP/GFR -     TSH -     Magnesium  -     UA/Microalbumin -     EKG   T2DM [facility]) Education: Reviewed 'ABCs' of diabetes management (respective goals in parentheses):  A1C (<7), blood pressure (<130/80), and cholesterol (LDL <70) Eye Exam yearly and Dental Exam every 6 months. She will schedule diabetic eye exam with Dr. Delman Cheadle, report requested Foot exam completed Dietary recommendations Physical Activity recommendations Previously fairly controlled by metformin -  Admits to poor diet/lifestyle, anticipate elevation If A1C above 8 plan to start ozempic per discussion today        -     A1C   CKD stage 2 due to type 2 diabetes mellitus (HCC) Increase fluids, avoid NSAIDS, monitor sugars, will monitor Continue ACEi -     CMP/GFR -     UA/ Microalbumin   Hyperlipidemia associated with T2DM (HCC) Statin myalgias Hx of statin intolerance with rosuvastatin 5 mg, ? Lovastatin remotely Tolerates Zetia; discussed CVD risks and receptive to nexlizet, sent in new script LDL goal <70 -continue medications, check lipids, decrease fatty foods, increase activity.  -     Lipid panel -     TSH   Tobacco use (30+ years, current smoker)  Actively cutting back on smoking, wellbutrin helpful, declines chantix She is reducing gradually Continue annual low dose screening CT - order placed today    Emphysema  ([place]) Per imaging; STOP SMOKING Denies notable sx, no inhalers, monitoring  Vitamin D deficiency -     VITAMIN D 25 Hydroxy (Vit-D Deficiency, Fractures)   Osteopenia - get dexa q2y (schedule with mammogram 2023, order placed), continue Vit D and Ca, weight bearing exercises  Anxiety Well managed by current regimen; continue medications Stress management techniques discussed, increase water, good sleep hygiene discussed, increase exercise, and increase veggies.    Diverticulosis Encourage high fiber diet;  History of adenomatous colon polyps Overdue colonoscopy, Dr. Havery Moros Will placed referral back to coordinate  Family history of AAA [place] screening 08/2019, recent chest CT negative for aortic dilation   Encounter for long-term (current) use of medications -     Magnesium  Obesity Long discussion about weight loss, diet, and exercise Recommended diet heavy in fruits and veggies and low in animal meats, cheeses, and dairy products, appropriate calorie intake Patient will work on making better dietary choices Discussed appropriate weight for height  Follow up at next visit  Right varicose veins with pain - reminded daily compression 20-30 mmHg - vascular surgery referral placed   Orders Placed This Encounter  Procedures   CT CHEST LUNG CA SCREEN LOW DOSE W/O CM   DG Bone Density   CBC with Differential/Platelet   COMPLETE METABOLIC PANEL WITH GFR   Magnesium   Lipid panel   TSH   Hemoglobin A1c   VITAMIN D 25 Hydroxy (Vit-D Deficiency, Fractures)   Vitamin B12  Microalbumin / creatinine urine ratio   Urinalysis, Routine w reflex microscopic   Ambulatory referral to Vascular Surgery   Ambulatory referral to Gastroenterology   EKG 12-Lead   HM DIABETES FOOT EXAM     Over 40 minutes of exam, counseling, chart review and critical decision making was performed Future Appointments  Date Time Provider [place]  10/26/2021  2:30 PM Alycia Rossetti, NP  [facility] None  07/19/2022  2:00 PM Darrol Jump, NP [facility] None    Plan:   During the course of the visit the patient was educated and counseled about appropriate screening and preventive services including:   Pneumococcal vaccine  Prevnar 13 Influenza vaccine Td vaccine Screening electrocardiogram Bone densitometry screening Colorectal cancer screening Diabetes screening Glaucoma screening Nutrition counseling  Advanced directives: requested   Subjective:  Joyce Shannon is a 66 y.o. female who presents for CPE. She has Essential hypertension; CKD stage 2 due to type 2 diabetes mellitus ([place]); Major depressive disorder, recurrent episode, in partial remission with anxious distress ([place]); Medication management; Hyperlipidemia associated with type 2 diabetes mellitus ([place]); Tobacco use (30+ years, current smoker) ; Obesity (BMI 30.0-34.9); Vitamin D deficiency; Type 2 diabetes mellitus ([place]); Family history of abdominal aortic aneurysm (AAA); Aortic arch atherosclerosis ([place]); Emphysema of lung ([place]); Osteopenia; B12 deficiency; Diverticulosis; History of adenomatous polyp of colon; Varicose veins of leg with pain, right; and Statin myopathy on their problem list.  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 On wellbutrin 150 mg daily and doing well with this, very rare use of xanax, typically rare use in the evening. She reports is sleeping fairly at night, improving, denies SE.    she currently continues to smoke <0.5 pack a day, down from previous 1 pack a day; discussed risks associated with smoking, patient is ready to quit, actively working on  cutting down. Wellbutrin has been very helpful. She has 30 pack year history, Ct lung 07/20/2019 showed emphysema. Never scheduled CT in 2022. Receptive to this today, order placed.   Hx of L varicose veins treated remotely per patient, has de well since, recently she reports having more R lower leg varicosities with tenderness and aching despite compression hose.   BMI is Body mass index is 30.38 kg/m., she has been working on diet and exercise, doing HIIT 3 days a week. Eating less meat, has a garden.  Wt Readings from Last 3 Encounters:  07/18/21 194 lb (88 kg)  11/24/20 196 lb (88.9 kg)  07/18/20 189 lb (85.7 kg)   Aortic atherosclerosis per chest CT 07/2019  Her blood pressure has been controlled at home, today their BP is BP: 120/64 She does workout. She denies chest pain, shortness of breath, dizziness.   She is on cholesterol medication (was on rosuvastatin 5 mg daily, had myalgia, she recalls has failed another, ? Lovastatin remotely, recently prescribed zeita 10 mg) and denies myalgias - admits has been off of med for several months, receptive to restart. Her cholesterol is not at goal. The cholesterol last visit was:   Lab Results  Component Value Date   CHOL 180 11/24/2020   HDL 40 (L) 11/24/2020  [place] 98 11/24/2020   TRIG 291 (H) 11/24/2020   CHOLHDL 4.5 11/24/2020    She has been working on diet and exercise for T2 diabetes (on metformin 500 mg BID, doesn't take 3 as prescribed), on bASA, ACEi and denies increased appetite, nausea, paresthesia of the feet, polydipsia and polyuria. Doesn't have glucometer supplies -  Last A1C in the office was:  Lab Results  Component Value Date   HGBA1C 7.2 (H) 11/24/2020   She has CKD II associated with T2DM monitored at this office. She is on ACEi. Last GFR: Lab Results  Component Value Date   EGFR 79 11/24/2020   Patient is on Vitamin D supplement, reports has been taking ? 5000 IU daily  Lab Results  Component Value Date    VD25OH 90 07/18/2020     She is on a B complex since last year  Lab Results  Component Value Date   VITAMINB12 395 07/18/2020      Medication Review: Current Outpatient Medications on File Prior to Visit  Medication Sig Dispense Refill   amLODipine-benazepril (LOTREL) 10-40 MG capsule TAKE 1 CAPSULE DAILY FOR BLOOD PRESSURE 90 capsule 3   aspirin EC 81 MG tablet Take 81 mg by mouth daily.     buPROPion (WELLBUTRIN XL) 150 MG 24 hr tablet TAKE 1 TABLET BY MOUTH DAILY FOR MOOD 30 tablet 11   Cyanocobalamin (B-12 PO) Take by mouth daily.     fexofenadine (ALLEGRA) 180 MG tablet Take 180 mg by mouth daily.     fluticasone (FLONASE) 50 MCG/ACT nasal spray Place 1 spray into both nostrils 2 (two) times daily as needed.     metFORMIN (GLUCOPHAGE-XR) 500 MG 24 hr tablet TAKE 1 TABLET 3 TIMES A DAY WITH MEALS FOR DIABETES 90 tablet 2   vitamin C (ASCORBIC ACID) 500 MG tablet Take 500 mg by mouth daily.     VITAMIN D, CHOLECALCIFEROL, PO Take 5,000 Units by mouth.      No current facility-administered medications on file prior to visit.    Allergies  Allergen Reactions   Other     Tylenol #3   Rosuvastatin     Myalgia    Tylox [Oxycodone-Acetaminophen]     Current Problems (verified) Patient Active Problem List   Diagnosis Date Noted   Varicose veins of leg with pain, right 07/18/2021   Statin myopathy 07/18/2021   B12 deficiency 07/16/2020   Diverticulosis 07/16/2020   History of adenomatous polyp of colon 07/16/2020   Osteopenia 11/02/2019   Aortic arch atherosclerosis ([place]) 07/21/2019   Emphysema of lung ([place]) 07/21/2019   Family history of abdominal aortic aneurysm (AAA) 04/22/2018   Type 2 diabetes mellitus ([place]) 10/04/2017   Obesity (BMI 30.0-34.9) 06/21/2014   Vitamin D deficiency 06/21/2014   CKD stage 2 due to type 2 diabetes mellitus ([place]) 01/07/2014   Major depressive disorder, recurrent episode, in partial remission with anxious distress ([place]) 01/07/2014    Medication management 01/07/2014   Hyperlipidemia associated with type 2 diabetes mellitus ([place]) 01/07/2014   Tobacco use (30+ years, current smoker)  01/07/2014   Essential hypertension 01/22/2012    Screening Tests Immunization History  Administered Date(s) Administered   Fluad Quad(high Dose 65+) 10/19/2020   Influenza, High Dose Seasonal PF 11/03/2019   Influenza-Unspecified 10/28/2014, 11/06/2018   PFIZER(Purple Top)SARS-COV-2 Vaccination 03/21/2019, 04/15/2019, 11/26/2019   Pneumococcal Conjugate-13 08/20/2019   Pneumococcal Polysaccharide-23 03/19/2016   Td 10/07/2003   Tdap 03/19/2016   Health Maintenance  Topic Date Due  Zoster Vaccines- Shingrix (1 of 2) Never done   COLONOSCOPY (Pts 45-72yr Insurance coverage will need to be confirmed)  12/07/2018   OPHTHALMOLOGY EXAM  06/30/2020   HEMOGLOBIN A1C  05/25/2021   COVID-19 Vaccine (4 - Booster for Pfizer series) 08/03/2021 (Originally 01/21/2020)   INFLUENZA VACCINE  09/05/2021   DEXA SCAN  11/01/2021   FOOT EXAM  07/19/2022   MAMMOGRAM  11/12/2022   TETANUS/TDAP  03/19/2026   Hepatitis C Screening  Completed   HPV VACCINES  Aged Out   Pneumonia Vaccine 66 Years old  Discontinued   Shingrix: discussed, can get at pharmacy   LMP s/p hysterectomy Pap: 2008 before hysterectomy, DONE MGM: 11/2021 breast center DEXA: 10/2019 T score -1.9, DEXA ordered to schedule with next mammogram   Colonoscopy:12/2015 numerous polyps, Dr. AHavery MorosWAS DUE 12/2018 - declined at the time, receptive today  CT lungs 07/2019, will schedule CT AAA [place] normal 08/2019  Names of Other Physician/Practitioners you currently use: 1. [facility] here for primary care 2. [facility], eye doctor, last visit 07/01/2019 abstracted - will call to schedule  3. Dr. WGilford Rile dentist, last visit 2021, reminded to schedule, looking for a new provider  Patient Care Team: MUnk Pinto MD as PCP - General  (Internal Medicine) Armbruster, SCarlota Raspberry MD as Consulting Physician (Gastroenterology)  SURGICAL HISTORY She  has a past surgical history that includes Cesarean section; Abdominal hysterectomy; Cholecystectomy; ovarian cystectomy (1985); and Tonsillectomy. FAMILY HISTORY Her family history includes AAA (abdominal aortic aneurysm) in her maternal grandfather; Asthma in her daughter; Breast cancer (age of onset: 610 in her mother; Congestive Heart Failure in her father; Diabetes in her brother and mother; Heart attack (age of onset: 622 in her father; Heart disease in her father and mother; Hypertension in her father; Multiple sclerosis in her brother; Prostate cancer (age of onset: 645 in her father. SOCIAL HISTORY She  reports that she has been smoking cigarettes. She started smoking about 43 years ago. She has a 32.25 pack-year smoking history. She has never used smokeless tobacco. She reports that she does not drink alcohol and does not use drugs.   Review of Systems  Constitutional:  Negative for malaise/fatigue and weight loss.  HENT:  Negative for hearing loss and tinnitus.   Eyes:  Negative for blurred vision and double vision.  Respiratory:  Negative for cough, sputum production, shortness of breath and wheezing.   Cardiovascular:  Negative for chest pain, palpitations, orthopnea, claudication, leg swelling and PND.       Tender R varicose veins, aching  Gastrointestinal:  Negative for abdominal pain, blood in stool, constipation, diarrhea, heartburn, melena, nausea and vomiting.  Genitourinary: Negative.   Musculoskeletal:  Negative for falls, joint pain and myalgias.  Skin:  Negative for rash.  Neurological:  Negative for dizziness, tingling, sensory change, weakness and headaches.  Endo/Heme/Allergies:  Negative for polydipsia.  Psychiatric/Behavioral: Negative.  Negative for depression, memory loss, substance abuse and suicidal ideas. The patient is not nervous/anxious and does  not have insomnia.   All other systems reviewed and are negative.    Objective:     Today's Vitals   07/18/21 1402 07/18/21 1430  BP: (!) 153/90 120/64  Pulse: 77   Temp: (!) 97.1 F (36.2 C)   SpO2: 96%   Weight: 194 lb (88 kg)   Height: 5' 7"  (1.702 m)    Body mass index is 30.38 kg/m.  General appearance: alert, no distress, WD/WN, female  HEENT: normocephalic, sclerae anicteric, TMs pearly, nares patent, no discharge or erythema, pharynx normal Oral cavity: MMM, good dentition, no lesions Neck: supple, no lymphadenopathy, no thyromegaly, no masses Heart: RRR, normal S1, S2, no murmurs Lungs: CTA bilaterally, no wheezes, rhonchi, or rales Abdomen: +bs, soft, non tender, non distended, no masses, no hepatomegaly, no splenomegaly Musculoskeletal: nontender, no swelling, no obvious deformity. Symmetrical ROM. Steady non-antalgic gait Extremities: no edema, no cyanosis, no clubbing. + tender R lower leg varicose veins.  Pulses: 2+ symmetric, upper and lower extremities, normal cap refill Neurological: alert, oriented x 3, CN2-12 intact, strength normal upper extremities and lower extremities, sensation normal throughout, DTRs 2+ throughout, no cerebellar signs, gait normal Psychiatric: normal affect, behavior normal, pleasant  Breasts: Getting mammograms, no concerns, declines GU: Defer Skin: warm, dry, intact; no rashes or concerning nevi, no ecchymoses. R temple with smooth 2 cm mobile mass to temple area.   EKG: NSR, IRBBB   The patient's weight, height, BMI, and visual acuity have been recorded in the chart.  I have made referrals, counseling, and provided education to the patient based on review of the above and I have provided the patient with a written personalized care plan for preventive services.     Izora Ribas, NP   07/18/2021

## 2021-07-19 LAB — URINALYSIS, ROUTINE W REFLEX MICROSCOPIC
Bilirubin Urine: NEGATIVE
Glucose, UA: NEGATIVE
Hgb urine dipstick: NEGATIVE
Ketones, ur: NEGATIVE
Leukocytes,Ua: NEGATIVE
Nitrite: NEGATIVE
Protein, ur: NEGATIVE
Specific Gravity, Urine: 1.015 (ref 1.001–1.035)
pH: 5.5 (ref 5.0–8.0)

## 2021-07-19 LAB — CBC WITH DIFFERENTIAL/PLATELET
Absolute Monocytes: 813 cells/uL (ref 200–950)
Basophils Absolute: 49 cells/uL (ref 0–200)
Basophils Relative: 0.5 %
Eosinophils Absolute: 196 cells/uL (ref 15–500)
Eosinophils Relative: 2 %
HCT: 42.3 % (ref 35.0–45.0)
Hemoglobin: 14.5 g/dL (ref 11.7–15.5)
Lymphs Abs: 3087 cells/uL (ref 850–3900)
MCH: 31.5 pg (ref 27.0–33.0)
MCHC: 34.3 g/dL (ref 32.0–36.0)
MCV: 91.8 fL (ref 80.0–100.0)
MPV: 9.9 fL (ref 7.5–12.5)
Monocytes Relative: 8.3 %
Neutro Abs: 5655 cells/uL (ref 1500–7800)
Neutrophils Relative %: 57.7 %
Platelets: 245 10*3/uL (ref 140–400)
RBC: 4.61 10*6/uL (ref 3.80–5.10)
RDW: 12.3 % (ref 11.0–15.0)
Total Lymphocyte: 31.5 %
WBC: 9.8 10*3/uL (ref 3.8–10.8)

## 2021-07-19 LAB — MICROALBUMIN / CREATININE URINE RATIO
Creatinine, Urine: 94 mg/dL (ref 20–275)
Microalb Creat Ratio: 28 mcg/mg creat (ref <30)
Microalb, Ur: 2.6 mg/dL

## 2021-07-19 LAB — COMPLETE METABOLIC PANEL WITH GFR
AG Ratio: 1.4 (calc) (ref 1.0–2.5)
ALT: 8 U/L (ref 6–29)
AST: 11 U/L (ref 10–35)
Albumin: 4.2 g/dL (ref 3.6–5.1)
Alkaline phosphatase (APISO): 69 U/L (ref 37–153)
BUN: 13 mg/dL (ref 7–25)
CO2: 25 mmol/L (ref 20–32)
Calcium: 9.8 mg/dL (ref 8.6–10.4)
Chloride: 106 mmol/L (ref 98–110)
Creat: 0.81 mg/dL (ref 0.50–1.05)
Globulin: 3 g/dL (calc) (ref 1.9–3.7)
Glucose, Bld: 149 mg/dL — ABNORMAL HIGH (ref 65–99)
Potassium: 4.2 mmol/L (ref 3.5–5.3)
Sodium: 140 mmol/L (ref 135–146)
Total Bilirubin: 0.5 mg/dL (ref 0.2–1.2)
Total Protein: 7.2 g/dL (ref 6.1–8.1)
eGFR: 80 mL/min/{1.73_m2} (ref >=60)

## 2021-07-19 LAB — LIPID PANEL
Cholesterol: 202 mg/dL — ABNORMAL HIGH (ref <200)
HDL: 41 mg/dL — ABNORMAL LOW (ref >=50)
LDL Cholesterol (Calc): 122 mg/dL (calc) — ABNORMAL HIGH
Non-HDL Cholesterol (Calc): 161 mg/dL (calc) — ABNORMAL HIGH (ref <130)
Total CHOL/HDL Ratio: 4.9 (calc) (ref <5.0)
Triglycerides: 253 mg/dL — ABNORMAL HIGH (ref <150)

## 2021-07-19 LAB — HEMOGLOBIN A1C
Hgb A1c MFr Bld: 7.5 % of total Hgb — ABNORMAL HIGH (ref <5.7)
Mean Plasma Glucose: 169 mg/dL
eAG (mmol/L): 9.3 mmol/L

## 2021-07-19 LAB — VITAMIN D 25 HYDROXY (VIT D DEFICIENCY, FRACTURES): Vit D, 25-Hydroxy: 37 ng/mL (ref 30–100)

## 2021-07-19 LAB — MAGNESIUM: Magnesium: 2.1 mg/dL (ref 1.5–2.5)

## 2021-07-19 LAB — TSH: TSH: 3.34 mIU/L (ref 0.40–4.50)

## 2021-07-19 LAB — VITAMIN B12: Vitamin B-12: 324 pg/mL (ref 200–1100)

## 2021-08-14 ENCOUNTER — Ambulatory Visit (INDEPENDENT_AMBULATORY_CARE_PROVIDER_SITE_OTHER): Payer: Medicare Other | Admitting: Nurse Practitioner

## 2021-08-14 ENCOUNTER — Encounter: Payer: Self-pay | Admitting: Nurse Practitioner

## 2021-08-14 ENCOUNTER — Other Ambulatory Visit: Payer: Self-pay

## 2021-08-14 VITALS — BP 122/70 | HR 72 | Temp 97.7°F | Ht 67.0 in | Wt 192.0 lb

## 2021-08-14 DIAGNOSIS — R0981 Nasal congestion: Secondary | ICD-10-CM

## 2021-08-14 DIAGNOSIS — G4483 Primary cough headache: Secondary | ICD-10-CM

## 2021-08-14 DIAGNOSIS — R062 Wheezing: Secondary | ICD-10-CM

## 2021-08-14 DIAGNOSIS — J0110 Acute frontal sinusitis, unspecified: Secondary | ICD-10-CM

## 2021-08-14 DIAGNOSIS — Z1152 Encounter for screening for COVID-19: Secondary | ICD-10-CM | POA: Diagnosis not present

## 2021-08-14 LAB — POC COVID19 BINAXNOW: SARS Coronavirus 2 Ag: NEGATIVE

## 2021-08-14 MED ORDER — BENZONATATE 100 MG PO CAPS: 100.0000 mg | ORAL_CAPSULE | Freq: Three times a day (TID) | ORAL | 1 refills | Status: DC | PRN

## 2021-08-14 MED ORDER — PROMETHAZINE-DM 6.25-15 MG/5ML PO SYRP: 5.0000 mL | ORAL_SOLUTION | Freq: Four times a day (QID) | ORAL | 0 refills | Status: DC | PRN

## 2021-08-14 MED ORDER — AZITHROMYCIN 250 MG PO TABS: ORAL_TABLET | ORAL | 1 refills | Status: DC

## 2021-08-14 NOTE — Progress Notes (Signed)
Assessment and Plan:  Joyce Shannon was seen today for an episodic visit.  Diagnoses and all order for this visit:  1. Encounter for screening for COVID-19 Negative  - POC COVID-19  2. Acute non-recurrent frontal sinusitis Stay well hydrated. Tylenol OTC PRN as directed.   - azithromycin (ZITHROMAX) 250 MG tablet; Take 2 tablets (500 mg) on Day 1 followed by 1 tablet  (250 mg) daily until complete.  Dispense: 6 each; Refill: 1  3. Primary cough headache Stay well hydrated. Tylenol OTC PRN as directed.   - benzonatate (TESSALON PERLES) 100 MG capsule; Take 1 capsule (100 mg total) by mouth 3 (three) times daily as needed for cough.  Dispense: 30 capsule; Refill: 1 - promethazine-dextromethorphan (PROMETHAZINE-DM) 6.25-15 MG/5ML syrup; Take 5 mLs by mouth 4 (four) times daily as needed for cough.  Dispense: 240 mL; Refill: 0  4. Nasal congestion Stay well hydrated. Continue Mucinex  5. Wheezing Continue to monitor If s/s fail to improve discussed CXR for further evaluation of PNA   Notify office for further evaluation and treatment, questions or concerns if s/s fail to improve. The risks and benefits of my recommendations, as well as other treatment options were discussed with the patient today. Questions were answered.  Further disposition pending results of labs. Discussed med's effects and SE's.    Over 20 minutes of exam, counseling, chart review, and critical decision making was performed.   Future Appointments  Date Time Provider [place]  10/26/2021  2:30 PM Alycia Rossetti, NP [facility] None  07/19/2022  2:00 PM Ashantae Pangallo, Kenney Houseman, NP [facility] None    ------------------------------------------------------------------------------------------------------------------   HPI BP 122/70   Pulse 72   Temp 97.7 F (36.5 C)   Ht 5' 7"  (1.702 m)   Wt 192 lb (87.1 kg)   SpO2 98%   BMI 30.07 kg/m     Joyce Shannon is a 66 y.o. female who presents  for evaluation of symptoms of a URI, possible sinusitis. Symptoms include achiness, congestion, cough described as productive, facial pain, headache described as throbbing, low grade fever, nasal congestion, post nasal drip, and sinus pressure. Onset of symptoms was 10 days ago, and has been unchanged since that time. Treatment to date: antihistamines and nasal steroids and mucinex. Recently exposed to daughter who traveled from [place].   Past Medical History:  Diagnosis Date   Anemia    Asthma    B12 deficiency 04/23/2018   Depression    Diabetes mellitus without complication ([place])    Hyperlipidemia    Hypertension    Right bundle branch block 05/2005   Varicose vein of leg    Vitamin D deficiency      Allergies  Allergen Reactions   Other     Tylenol #3   Rosuvastatin     Myalgia    Tylox [Oxycodone-Acetaminophen]     Current Outpatient Medications on File Prior to Visit  Medication Sig   ALPRAZolam (XANAX) 0.5 MG tablet Take 1/2 to 1 tablet twice daily as needed for severe anxiety.   amLODipine-benazepril (LOTREL) 10-40 MG capsule TAKE 1 CAPSULE DAILY FOR BLOOD PRESSURE   aspirin EC 81 MG tablet Take 81 mg by mouth daily.   Bempedoic Acid-Ezetimibe (NEXLIZET) 180-10 MG TABS Take 1 tablet by mouth daily.   blood glucose meter kit and supplies KIT Test blood sugar once daily and as needed.   buPROPion (WELLBUTRIN XL) 150 MG 24 hr tablet TAKE 1 TABLET BY MOUTH DAILY FOR MOOD  Cyanocobalamin (B-12 PO) Take by mouth daily.   fexofenadine (ALLEGRA) 180 MG tablet Take 180 mg by mouth daily.   fluticasone (FLONASE) 50 MCG/ACT nasal spray Place 1 spray into both nostrils 2 (two) times daily as needed.   metFORMIN (GLUCOPHAGE-XR) 500 MG 24 hr tablet TAKE 1 TABLET 3 TIMES A DAY WITH MEALS FOR DIABETES   vitamin C (ASCORBIC ACID) 500 MG tablet Take 500 mg by mouth daily.   VITAMIN D, CHOLECALCIFEROL, PO Take 5,000 Units by mouth.    No current facility-administered medications on  file prior to visit.    ROS: all negative except what is noted in the HPI.    Physical Exam:  BP 122/70   Pulse 72   Temp 97.7 F (36.5 C)   Ht 5' 7"  (1.702 m)   Wt 192 lb (87.1 kg)   SpO2 98%   BMI 30.07 kg/m   General Appearance: NAD.  Awake, conversant and cooperative. Eyes: PERRLA, EOMs intact.  Sclera white.  Conjunctiva without erythema. Sinuses: Frontal/maxillary tenderness.  No nasal discharge. Nares patent.  ENT/Mouth: Ext aud canals clear.  Bilateral TMs w/DOL and without erythema or bulging. Hearing intact.  Posterior pharynx without swelling or exudate.  Tonsils without swelling or erythema.  Neck: Supple.  No masses, nodules or thyromegaly. Respiratory: Right lower lobe with rhonchi upon expiration.  Right upper lobe with wheezing upon expiration.  Effort is regular with non-labored breathing.  Cardio: RRR with no MRGs. Brisk peripheral pulses without edema.  Abdomen: Active BS in all four quadrants.  Soft and non-tender without guarding, rebound tenderness, hernias or masses. Lymphatics: Non tender without lymphadenopathy.  Musculoskeletal: Full ROM, 5/5 strength, normal ambulation.  No clubbing or cyanosis. Skin: Appropriate color for ethnicity. Warm without rashes, lesions, ecchymosis, ulcers.  Neuro: CN II-XII grossly normal. Normal muscle tone without cerebellar symptoms and intact sensation.   Psych: AO X 3,  appropriate mood and affect, insight and judgment.     Darrol Jump, NP 11:00 AM [facility]

## 2021-08-14 NOTE — Patient Instructions (Signed)

## 2021-10-08 ENCOUNTER — Other Ambulatory Visit: Payer: Self-pay | Admitting: Internal Medicine

## 2021-10-08 DIAGNOSIS — I10 Essential (primary) hypertension: Secondary | ICD-10-CM

## 2021-10-25 ENCOUNTER — Encounter (INDEPENDENT_AMBULATORY_CARE_PROVIDER_SITE_OTHER): Payer: Self-pay

## 2021-10-25 ENCOUNTER — Encounter (INDEPENDENT_AMBULATORY_CARE_PROVIDER_SITE_OTHER): Payer: Self-pay | Admitting: Nurse Practitioner

## 2021-10-25 NOTE — Progress Notes (Unsigned)
3 MONTH FOLLOW UP  Assessment:    Aortic atherosclerosis Per CT 07/2019 Control blood pressure, cholesterol, glucose, increase exercise.   Essential hypertension - continue medications, DASH diet, exercise and monitor at home. Call if greater than 130/80.  -     CBC with Differential/Platelet -     CMP/GFR -     TSH   T2DM Currently fairly controlled by low dose metformin Education: Reviewed 'ABCs' of diabetes management (respective goals in parentheses):  A1C (<7), blood pressure (<130/80), and cholesterol (LDL <70) Eye Exam yearly and Dental Exam every 6 months. She will schedule diabetic eye exam with Dr. Delman Cheadle Foot exam completed Dietary recommendations Physical Activity recommendations   CKD stage 2 due to type 2 diabetes mellitus [facility]) Discussed general issues about diabetes pathophysiology and management., Educational material distributed., Suggested low cholesterol diet., Encouraged aerobic exercise., Discussed foot care., Reminded to get yearly retinal exam. -     CMP/GFR -     Microalbumin   Hyperlipidemia, unspecified hyperlipidemia type On rosuvastatin 5 mg BID, now on nexlizet daily,  LDL goal <70 -continue medications, check lipids, decrease fatty foods, increase activity.  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    Over 40 minutes of exam, counseling, chart review and critical decision making was performed Future Appointments  Date Time Provider  [place]  10/26/2021  2:30 PM Alycia Rossetti, NP [facility] None  07/19/2022  2:00 PM Darrol Jump, NP [facility] None     Subjective:  Joyce Shannon is a 66 y.o. female who presents for 3 month follow up.   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 On wellbutrin 150 mg daily and doing well with this, very rare use of xanax, typically in the evening. She reports is sleeping fairly at night, improving, denies SE.    she currently continues to smoke <0.5 pack a day, down from previous 1 pack a day; discussed risks associated with smoking, patient is ready to quit, actively working on cutting down. Wellbutrin has been very helpful. She has 30 pack year history, Ct lung 07/20/2019 which showed emphysema.   BMI is There is no height or weight on file to calculate BMI., she has been working on diet and exercise, no longer walking due to new job, needs to find new exercise. May restart program with [facility]. Wt Readings from Last 3 Encounters:  08/14/21 192 lb (87.1 kg)  07/18/21 194 lb (88 kg)  11/24/20 196 lb (88.9 kg)   Aortic atherosclerosis per chest CT 07/2019  Her blood pressure has been controlled at home, today their BP is   She does not workout. She denies chest pain, shortness of breath, dizziness.   She is on cholesterol medication (was on rosuvastatin 5 mg daily, had myalgia, was tolerating  1-2 days a week but, recently started nexlizet and stopped) and denies myalgias. Her cholesterol is not at goal. The cholesterol last visit was:   Lab Results  Component Value Date   CHOL 202 (H) 07/18/2021   HDL 41 (L) 07/18/2021   LDLCALC 122 (H) 07/18/2021   TRIG 253 (H) 07/18/2021   CHOLHDL 4.9 07/18/2021    She has been  working on diet and exercise for T2 diabetes (on metformin 500 mg daily with largest meal, advised to increase to 2-3 tabs, reports will take 1-3 depending on meals), and denies increased appetite, nausea, paresthesia of the feet, polydipsia and polyuria.  Does have supplies but not checking glucose -  Last A1C in the office was:  Lab Results  Component Value Date   HGBA1C 7.5 (H) 07/18/2021   She has CKD II associated with T2DM monitored at this office, on benzapril. Last GFR: Lab Results  Component Value Date   GFRNONAA 68 07/18/2020   Patient is on Vitamin D supplement, was off at last check, reports has been taking ? 1000 IU daily  Lab Results  Component Value Date   VD25OH 37 07/18/2021      Medication Review: Current Outpatient Medications on File Prior to Visit  Medication Sig Dispense Refill   ALPRAZolam (XANAX) 0.5 MG tablet Take 1/2 to 1 tablet twice daily as needed for severe anxiety. 30 tablet 0   amLODipine-benazepril (LOTREL) 10-40 MG capsule TAKE 1 CAPSULE BY MOUTH EVERY DAY FOR BLOOD PRESSURE 30 capsule 11   aspirin EC 81 MG tablet Take 81 mg by mouth daily.     azithromycin (ZITHROMAX) 250 MG tablet Take 2 tablets (500 mg) on Day 1 followed by 1 tablet  (250 mg) daily until complete. 6 each 1   Bempedoic Acid-Ezetimibe (NEXLIZET) 180-10 MG TABS Take 1 tablet by mouth daily. 90 tablet 3   benzonatate (TESSALON PERLES) 100 MG capsule Take 1 capsule (100 mg total) by mouth 3 (three) times daily as needed for cough. 30 capsule 1   blood glucose meter kit and supplies KIT Test blood sugar once daily and as needed. 1 each PRN   buPROPion (WELLBUTRIN XL) 150 MG 24 hr tablet TAKE 1 TABLET BY MOUTH DAILY FOR MOOD 30 tablet 11   Cyanocobalamin (B-12 PO) Take by mouth daily.     fexofenadine (ALLEGRA) 180 MG tablet Take 180 mg by mouth daily.     fluticasone (FLONASE) 50 MCG/ACT nasal spray Place 1 spray into both nostrils 2 (two) times daily as needed.     metFORMIN  (GLUCOPHAGE-XR) 500 MG 24 hr tablet TAKE 1 TABLET 3 TIMES A DAY WITH MEALS FOR DIABETES 90 tablet 2   promethazine-dextromethorphan (PROMETHAZINE-DM) 6.25-15 MG/5ML syrup Take 5 mLs by mouth 4 (four) times daily as needed for cough. 240 mL 0   vitamin C (ASCORBIC ACID) 500 MG tablet Take 500 mg by mouth daily.     VITAMIN D, CHOLECALCIFEROL, PO Take 5,000 Units by mouth.      No current facility-administered medications on file prior to visit.    Allergies  Allergen Reactions   Other     Tylenol #3   Rosuvastatin     Myalgia    Tylox [Oxycodone-Acetaminophen]     Current Problems (verified) Patient Active Problem List   Diagnosis Date Noted   Varicose veins of leg with pain, right 07/18/2021   Statin myopathy 07/18/2021   B12 deficiency 07/16/2020   Diverticulosis 07/16/2020   History of adenomatous  polyp of colon 07/16/2020   Osteopenia 11/02/2019   Aortic arch atherosclerosis ([place]) 07/21/2019   Emphysema of lung ([place]) 07/21/2019   Family history of abdominal aortic aneurysm (AAA) 04/22/2018   Type 2 diabetes mellitus ([place]) 10/04/2017   Obesity (BMI 30.0-34.9) 06/21/2014   Vitamin D deficiency 06/21/2014   CKD stage 2 due to type 2 diabetes mellitus ([place]) 01/07/2014   Major depressive disorder, recurrent episode, in partial remission with anxious distress ([place]) 01/07/2014   Medication management 01/07/2014   Hyperlipidemia associated with type 2 diabetes mellitus ([place]) 01/07/2014   Tobacco use (30+ years, current smoker)  01/07/2014   Essential hypertension 01/22/2012    SURGICAL HISTORY She  has a past surgical history that includes Cesarean section; Abdominal hysterectomy; Cholecystectomy; ovarian cystectomy (1985); and Tonsillectomy. FAMILY HISTORY Her family history includes AAA (abdominal aortic aneurysm) in her maternal grandfather; Asthma in her daughter; Breast cancer (age of onset: 25) in her mother; Congestive Heart Failure in her father; Diabetes in her brother  and mother; Heart attack (age of onset: 9) in her father; Heart disease in her father and mother; Hypertension in her father; Multiple sclerosis in her brother; Prostate cancer (age of onset: 40) in her father. SOCIAL HISTORY She  reports that she has been smoking cigarettes. She started smoking about 43 years ago. She has a 32.25 pack-year smoking history. She has never used smokeless tobacco. She reports that she does not drink alcohol and does not use drugs.   Review of Systems  Constitutional:  Negative for malaise/fatigue and weight loss.  HENT:  Negative for hearing loss and tinnitus.   Eyes:  Negative for blurred vision and double vision.  Respiratory:  Negative for cough, sputum production, shortness of breath and wheezing.   Cardiovascular:  Negative for chest pain, palpitations, orthopnea, claudication, leg swelling and PND.  Gastrointestinal:  Negative for abdominal pain, blood in stool, constipation, diarrhea, heartburn, melena, nausea and vomiting.  Genitourinary: Negative.   Musculoskeletal:  Negative for falls, joint pain and myalgias.  Skin:  Negative for rash.  Neurological:  Negative for dizziness, tingling, sensory change, weakness and headaches.  Endo/Heme/Allergies:  Negative for polydipsia.  Psychiatric/Behavioral: Negative.  Negative for depression, memory loss, substance abuse and suicidal ideas. The patient is not nervous/anxious and does not have insomnia.   All other systems reviewed and are negative.    Objective:     There were no vitals filed for this visit.  There is no height or weight on file to calculate BMI.  General appearance: alert, no distress, WD/WN, female HEENT: normocephalic, sclerae anicteric, TMs pearly, nares patent, no discharge or erythema, pharynx normal Oral cavity: MMM, no lesions Neck: supple, no lymphadenopathy, no thyromegaly, no masses Heart: RRR, normal S1, S2, no murmurs Lungs: CTA bilaterally, no wheezes, rhonchi, or  rales Abdomen: +bs, soft, non tender, non distended, no masses, no hepatomegaly, no splenomegaly Musculoskeletal: nontender, no swelling, no obvious deformity Extremities: no edema, no cyanosis, no clubbing Pulses: 2+ symmetric, upper and lower extremities, normal cap refill Neurological: alert, oriented x 3, CN2-12 intact, strength normal upper extremities and lower extremities, sensation normal throughout, DTRs 2+ throughout, no cerebellar signs, gait normal Psychiatric: normal affect, behavior normal, pleasant     Alycia Rossetti, NP   10/25/2021

## 2021-10-26 ENCOUNTER — Ambulatory Visit (INDEPENDENT_AMBULATORY_CARE_PROVIDER_SITE_OTHER): Payer: Medicare Other | Admitting: Nurse Practitioner

## 2021-10-26 ENCOUNTER — Encounter: Payer: Self-pay | Admitting: Nurse Practitioner

## 2021-10-26 VITALS — BP 130/72 | HR 72 | Temp 97.5°F | Ht 67.0 in | Wt 195.0 lb

## 2021-10-26 DIAGNOSIS — E1122 Type 2 diabetes mellitus with diabetic chronic kidney disease: Secondary | ICD-10-CM

## 2021-10-26 DIAGNOSIS — Z122 Encounter for screening for malignant neoplasm of respiratory organs: Secondary | ICD-10-CM

## 2021-10-26 DIAGNOSIS — I700 Atherosclerosis of aorta: Secondary | ICD-10-CM

## 2021-10-26 DIAGNOSIS — E1169 Type 2 diabetes mellitus with other specified complication: Secondary | ICD-10-CM

## 2021-10-26 DIAGNOSIS — F3341 Major depressive disorder, recurrent, in partial remission: Secondary | ICD-10-CM

## 2021-10-26 DIAGNOSIS — J439 Emphysema, unspecified: Secondary | ICD-10-CM

## 2021-10-26 DIAGNOSIS — N182 Chronic kidney disease, stage 2 (mild): Secondary | ICD-10-CM

## 2021-10-26 DIAGNOSIS — I10 Essential (primary) hypertension: Secondary | ICD-10-CM

## 2021-10-26 DIAGNOSIS — Z79899 Other long term (current) drug therapy: Secondary | ICD-10-CM

## 2021-10-26 DIAGNOSIS — E669 Obesity, unspecified: Secondary | ICD-10-CM

## 2021-10-26 DIAGNOSIS — F17200 Nicotine dependence, unspecified, uncomplicated: Secondary | ICD-10-CM

## 2021-10-26 DIAGNOSIS — E559 Vitamin D deficiency, unspecified: Secondary | ICD-10-CM

## 2021-10-26 DIAGNOSIS — Z87891 Personal history of nicotine dependence: Secondary | ICD-10-CM

## 2021-10-26 DIAGNOSIS — E785 Hyperlipidemia, unspecified: Secondary | ICD-10-CM

## 2021-10-26 DIAGNOSIS — Z720 Tobacco use: Secondary | ICD-10-CM

## 2021-10-26 NOTE — Patient Instructions (Signed)

## 2021-10-27 LAB — CBC WITH DIFFERENTIAL/PLATELET
Absolute Monocytes: 1106 cells/uL — ABNORMAL HIGH (ref 200–950)
Basophils Absolute: 46 cells/uL (ref 0–200)
Basophils Relative: 0.4 %
Eosinophils Absolute: 239 cells/uL (ref 15–500)
Eosinophils Relative: 2.1 %
HCT: 41.1 % (ref 35.0–45.0)
Hemoglobin: 14.2 g/dL (ref 11.7–15.5)
Lymphs Abs: 3705 cells/uL (ref 850–3900)
MCH: 31.6 pg (ref 27.0–33.0)
MCHC: 34.5 g/dL (ref 32.0–36.0)
MCV: 91.3 fL (ref 80.0–100.0)
MPV: 9.9 fL (ref 7.5–12.5)
Monocytes Relative: 9.7 %
Neutro Abs: 6304 cells/uL (ref 1500–7800)
Neutrophils Relative %: 55.3 %
Platelets: 312 10*3/uL (ref 140–400)
RBC: 4.5 10*6/uL (ref 3.80–5.10)
RDW: 13 % (ref 11.0–15.0)
Total Lymphocyte: 32.5 %
WBC: 11.4 10*3/uL — ABNORMAL HIGH (ref 3.8–10.8)

## 2021-10-27 LAB — COMPLETE METABOLIC PANEL WITH GFR
AG Ratio: 1.6 (calc) (ref 1.0–2.5)
ALT: 15 U/L (ref 6–29)
AST: 16 U/L (ref 10–35)
Albumin: 4.4 g/dL (ref 3.6–5.1)
Alkaline phosphatase (APISO): 57 U/L (ref 37–153)
BUN: 12 mg/dL (ref 7–25)
CO2: 28 mmol/L (ref 20–32)
Calcium: 9.9 mg/dL (ref 8.6–10.4)
Chloride: 106 mmol/L (ref 98–110)
Creat: 0.8 mg/dL (ref 0.50–1.05)
Globulin: 2.8 g/dL (calc) (ref 1.9–3.7)
Glucose, Bld: 101 mg/dL — ABNORMAL HIGH (ref 65–99)
Potassium: 4.6 mmol/L (ref 3.5–5.3)
Sodium: 140 mmol/L (ref 135–146)
Total Bilirubin: 0.6 mg/dL (ref 0.2–1.2)
Total Protein: 7.2 g/dL (ref 6.1–8.1)
eGFR: 81 mL/min/{1.73_m2} (ref >=60)

## 2021-10-27 LAB — LIPID PANEL
Cholesterol: 110 mg/dL (ref <200)
HDL: 31 mg/dL — ABNORMAL LOW (ref >=50)
LDL Cholesterol (Calc): 50 mg/dL (calc)
Non-HDL Cholesterol (Calc): 79 mg/dL (calc) (ref <130)
Total CHOL/HDL Ratio: 3.5 (calc) (ref <5.0)
Triglycerides: 230 mg/dL — ABNORMAL HIGH (ref <150)

## 2021-10-27 LAB — HEMOGLOBIN A1C
Hgb A1c MFr Bld: 7.4 % of total Hgb — ABNORMAL HIGH (ref <5.7)
Mean Plasma Glucose: 166 mg/dL
eAG (mmol/L): 9.2 mmol/L

## 2021-10-30 ENCOUNTER — Encounter: Payer: Self-pay | Admitting: Nurse Practitioner

## 2021-10-30 ENCOUNTER — Other Ambulatory Visit: Payer: Self-pay | Admitting: Nurse Practitioner

## 2021-10-30 DIAGNOSIS — E1122 Type 2 diabetes mellitus with diabetic chronic kidney disease: Secondary | ICD-10-CM

## 2021-10-30 MED ORDER — SEMAGLUTIDE(0.25 OR 0.5MG/DOS) 2 MG/3ML ~~LOC~~ SOPN: 0.2500 mg | PEN_INJECTOR | SUBCUTANEOUS | 1 refills | Status: DC

## 2021-11-20 ENCOUNTER — Ambulatory Visit
Admission: RE | Admit: 2021-11-20 | Discharge: 2021-11-20 | Disposition: A | Discharge status: Home / Self Care | Payer: Medicare Other | Source: Ambulatory Visit | Attending: Nurse Practitioner | Admitting: Nurse Practitioner

## 2021-11-20 DIAGNOSIS — Z122 Encounter for screening for malignant neoplasm of respiratory organs: Secondary | ICD-10-CM

## 2021-11-20 DIAGNOSIS — F17200 Nicotine dependence, unspecified, uncomplicated: Secondary | ICD-10-CM

## 2021-11-20 DIAGNOSIS — Z87891 Personal history of nicotine dependence: Secondary | ICD-10-CM

## 2021-11-20 DIAGNOSIS — J439 Emphysema, unspecified: Secondary | ICD-10-CM

## 2021-11-20 DIAGNOSIS — Z720 Tobacco use: Secondary | ICD-10-CM

## 2021-11-22 ENCOUNTER — Other Ambulatory Visit: Payer: Self-pay | Admitting: Nurse Practitioner

## 2021-11-22 DIAGNOSIS — R911 Solitary pulmonary nodule: Secondary | ICD-10-CM

## 2021-11-28 ENCOUNTER — Ambulatory Visit: Payer: BC Managed Care – PPO | Admitting: Adult Health

## 2021-12-04 ENCOUNTER — Encounter (INDEPENDENT_AMBULATORY_CARE_PROVIDER_SITE_OTHER): Payer: Self-pay

## 2022-01-25 ENCOUNTER — Ambulatory Visit (INDEPENDENT_AMBULATORY_CARE_PROVIDER_SITE_OTHER): Payer: Medicare Other | Admitting: Internal Medicine

## 2022-01-25 ENCOUNTER — Encounter: Payer: Self-pay | Admitting: Internal Medicine

## 2022-01-25 VITALS — BP 138/78 | HR 73 | Temp 97.5°F | Ht 67.0 in | Wt 193.0 lb

## 2022-01-25 DIAGNOSIS — E1122 Type 2 diabetes mellitus with diabetic chronic kidney disease: Secondary | ICD-10-CM | POA: Diagnosis not present

## 2022-01-25 DIAGNOSIS — E538 Deficiency of other specified B group vitamins: Secondary | ICD-10-CM

## 2022-01-25 DIAGNOSIS — E559 Vitamin D deficiency, unspecified: Secondary | ICD-10-CM

## 2022-01-25 DIAGNOSIS — E1169 Type 2 diabetes mellitus with other specified complication: Secondary | ICD-10-CM

## 2022-01-25 DIAGNOSIS — E785 Hyperlipidemia, unspecified: Secondary | ICD-10-CM

## 2022-01-25 DIAGNOSIS — N182 Chronic kidney disease, stage 2 (mild): Secondary | ICD-10-CM

## 2022-01-25 DIAGNOSIS — Z79899 Other long term (current) drug therapy: Secondary | ICD-10-CM

## 2022-01-25 DIAGNOSIS — I10 Essential (primary) hypertension: Secondary | ICD-10-CM

## 2022-01-25 DIAGNOSIS — I700 Atherosclerosis of aorta: Secondary | ICD-10-CM

## 2022-01-25 MED ORDER — METFORMIN HCL ER 500 MG PO TB24: ORAL_TABLET | ORAL | 3 refills | Status: DC

## 2022-01-25 NOTE — Patient Instructions (Signed)

## 2022-01-25 NOTE — Progress Notes (Signed)
Future Appointments  Date Time Provider Department  01/25/2022  3:30 PM Unk Pinto, MD [facility]  Needs 3 mo Medicare Wellness     07/19/2022                          cpe  2:00 PM Darrol Jump, NP [facility]    History of Present Illness:      This very nice 66 y.o. MWF presents for 3 month follow up with HTN, HLD, T2_NIDDM and Vitamin D Deficiency.  Chest CT scan in 2021 showed Aortic Arch Atherosclerosis.         Patient is treated for HTN  circa 2000 & BP has been controlled at home. Today's BP is at goal - 138/78. Patient has had no complaints of any cardiac type chest pain, palpitations, dyspnea Vertell Limber /PND, dizziness, claudication or dependent edema.        Hyperlipidemia is controlled with diet & Nexlizet. Patient denies myalgias or other med SE's. Last Lipids were at goal except elevated Trig's :  Lab Results  Component Value Date   CHOL 110 10/26/2021   HDL 31 (L) 10/26/2021   LDLCALC 50 10/26/2021   TRIG 230 (H) 10/26/2021   CHOLHDL 3.5 10/26/2021     Also, the patient has history of T2_NIDDM (2013) w/CKD2  (GFR 80)  and has had no symptoms of reactive hypoglycemia, diabetic polys, paresthesias or visual blurring.  Last A1c was not at goal :  Lab Results  Component Value Date   HGBA1C 7.4 (H) 10/26/2021                                                        Further, the patient also has history of Vitamin D Deficiency  ("15" /2017) and supplements vitamin D. Last vitamin D was low :  Lab Results  Component Value Date   VD25OH 37 07/18/2021     Current Outpatient Medications on File Prior to Visit  Medication Sig   ALPRAZolam 0.5 MG tablet Take 1/2 to 1 tablet twice daily as needed    amLODipine-benazepril 10-40 MG  TAKE 1 CAPSULE  EVERY DAY    NEXLIZET 180-10 MG TABS Take 1 tablet  daily.   buPROPion  XL 150 MG 24 hr tablet TAKE 1 TABLET  DAILY FOR MOOD   B-12  tablets  Take  daily.   fexofenadine  180 MG tablet Take  daily.    FLONASE  nasal spray Place 1 spray into both nostrils 2  times daily as needed.   vitamin C 500 MG tablet Take daily   VITAMIN D  5,000 Units  Take daily   metFORMIN -XR 500 MG  Patient not taking: Reported on 01/25/2022   Semaglutide,0.25 or 0.'5MG'$ /DOS, 2 MG/3ML  Patient not taking: Reported on 01/25/2022     Allergies  Allergen Reactions   Other     Tylenol #3   Rosuvastatin     Myalgia    Tylox [Oxycodone-Acetaminophen]      PMHx:   Past Medical History:  Diagnosis Date   Anemia    Asthma    B12 deficiency 04/23/2018   Depression    Diabetes mellitus without complication (HCC)    Hyperlipidemia    Hypertension    Right bundle branch block  05/2005   Varicose vein of leg    Vitamin D deficiency      Immunization History  Administered Date(s) Administered   Fluad Quad(high Dose) 10/19/2020   Influenza, High Dose  11/03/2019   Influenza- 10/28/2014, 11/06/2018, 10/17/2021   PFIZER-SARS-COV-2 Vacc 03/21/2019, 04/15/2019, 11/26/2019   Pneumococcal -13 08/20/2019   Pneumococcal -23 03/19/2016   Td 10/07/2003   Tdap 03/19/2016     Past Surgical History:  Procedure Laterality Date   ABDOMINAL HYSTERECTOMY     Total    CESAREAN SECTION     CHOLECYSTECTOMY     ovarian cystectomy  1985   TONSILLECTOMY      FHx:    Reviewed / unchanged  SHx:    Reviewed / unchanged   Systems Review:  Constitutional: Denies fever, chills, wt changes, headaches, insomnia, fatigue, night sweats, change in appetite. Eyes: Denies redness, blurred vision, diplopia, discharge, itchy, watery eyes.  ENT: Denies discharge, congestion, post nasal drip, epistaxis, sore throat, earache, hearing loss, dental pain, tinnitus, vertigo, sinus pain, snoring.  CV: Denies chest pain, palpitations, irregular heartbeat, syncope, dyspnea, diaphoresis, orthopnea, PND, claudication or edema. Respiratory: denies cough, dyspnea, DOE, pleurisy, hoarseness, laryngitis, wheezing.  Gastrointestinal: Denies  dysphagia, odynophagia, heartburn, reflux, water brash, abdominal pain or cramps, nausea, vomiting, bloating, diarrhea, constipation, hematemesis, melena, hematochezia  or hemorrhoids. Genitourinary: Denies dysuria, frequency, urgency, nocturia, hesitancy, discharge, hematuria or flank pain. Musculoskeletal: Denies arthralgias, myalgias, stiffness, jt. swelling, pain, limping or strain/sprain.  Skin: Denies pruritus, rash, hives, warts, acne, eczema or change in skin lesion(s). Neuro: No weakness, tremor, incoordination, spasms, paresthesia or pain. Psychiatric: Denies confusion, memory loss or sensory loss. Endo: Denies change in weight, skin or hair change.  Heme/Lymph: No excessive bleeding, bruising or enlarged lymph nodes.  Physical Exam  BP 138/78   Pulse 73   Temp (!) 97.5 F (36.4 C)   Ht '5\' 7"'$  (1.702 m)   Wt 193 lb (87.5 kg)   SpO2 95%   BMI 30.23 kg/m   Appears  well nourished, well groomed  and in no distress.  Eyes: PERRLA, EOMs, conjunctiva no swelling or erythema. Sinuses: No frontal/maxillary tenderness ENT/Mouth: EAC's clear, TM's nl w/o erythema, bulging. Nares clear w/o erythema, swelling, exudates. Oropharynx clear without erythema or exudates. Oral hygiene is good. Tongue normal, non obstructing. Hearing intact.  Neck: Supple. Thyroid not palpable. Car 2+/2+ without bruits, nodes or JVD. Chest: Respirations nl with BS clear & equal w/o rales, rhonchi, wheezing or stridor.  Cor: Heart sounds normal w/ regular rate and rhythm without sig. murmurs, gallops, clicks or rubs. Peripheral pulses normal and equal  without edema.  Abdomen: Soft & bowel sounds normal. Non-tender w/o guarding, rebound, hernias, masses or organomegaly.  Lymphatics: Unremarkable.  Musculoskeletal: Full ROM all peripheral extremities, joint stability, 5/5 strength and normal gait.  Skin: Warm, dry without exposed rashes, lesions or ecchymosis apparent.  Neuro: Cranial nerves intact, reflexes  equal bilaterally. Sensory-motor testing grossly intact. Tendon reflexes grossly intact.  Pysch: Alert & oriented x 3.  Insight and judgement nl & appropriate. No ideations.  Assessment and Plan:  1. Essential hypertension  - Continue medication, monitor blood pressure at home.  - Continue DASH diet.  Reminder to go to the ER if any CP,  SOB, nausea, dizziness, severe HA, changes vision/speech.   - CBC with Differential/Platelet - COMPLETE METABOLIC PANEL WITH GFR - Magnesium  2. Hyperlipidemia associated with type 2 diabetes mellitus ([place])  - Continue diet/meds, exercise,& lifestyle modifications.  -  Continue monitor periodic cholesterol/liver & renal functions  - TSH   - Lipid panel - TSH  3. Type 2 diabetes mellitus with stage 2 chronic kidney                                disease, without long-term current use of insulin ([place])   - Patient was intolerant to Ozempic & desires to retry Metformin  - Continue diet, exercise  - Lifestyle modifications.  - Monitor appropriate labs   - Hemoglobin A1c - Insulin, random  4. B12 deficiency  - Vitamin B12  5. Vitamin D deficiency  - Continue supplementation    - VITAMIN D 25 Hydroxy   6. Aortic arch atherosclerosis (HCC)  - Lipid panel  7. Medication management  - CBC with Differential/Platelet - COMPLETE METABOLIC PANEL WITH GFR - Magnesium - Lipid panel - TSH - Hemoglobin A1c - Insulin, random - VITAMIN D 25 Hydroxy - Vitamin B12          Discussed  regular exercise, BP monitoring, weight control to achieve/maintain BMI less than 25 and discussed med and SE's. Recommended labs to assess /monitor clinical status .  I discussed the assessment and treatment plan with the patient. The patient was provided an opportunity to ask questions and all were answered. The patient agreed with the plan and demonstrated an understanding of the instructions.  I provided over 30 minutes of exam, counseling, chart review and   complex critical decision making.        The patient was advised to call back or seek an in-person evaluation if the symptoms worsen or if the condition fails to improve as anticipated.   Kirtland Bouchard, MD

## 2022-01-26 LAB — CBC WITH DIFFERENTIAL/PLATELET
Absolute Monocytes: 750 cells/uL (ref 200–950)
Basophils Absolute: 70 cells/uL (ref 0–200)
Basophils Relative: 0.7 %
Eosinophils Absolute: 190 cells/uL (ref 15–500)
Eosinophils Relative: 1.9 %
HCT: 41.2 % (ref 35.0–45.0)
Hemoglobin: 14.6 g/dL (ref 11.7–15.5)
Lymphs Abs: 3470 cells/uL (ref 850–3900)
MCH: 32 pg (ref 27.0–33.0)
MCHC: 35.4 g/dL (ref 32.0–36.0)
MCV: 90.4 fL (ref 80.0–100.0)
MPV: 10.3 fL (ref 7.5–12.5)
Monocytes Relative: 7.5 %
Neutro Abs: 5520 cells/uL (ref 1500–7800)
Neutrophils Relative %: 55.2 %
Platelets: 252 10*3/uL (ref 140–400)
RBC: 4.56 10*6/uL (ref 3.80–5.10)
RDW: 12 % (ref 11.0–15.0)
Total Lymphocyte: 34.7 %
WBC: 10 10*3/uL (ref 3.8–10.8)

## 2022-01-26 LAB — COMPLETE METABOLIC PANEL WITH GFR
AG Ratio: 1.5 (calc) (ref 1.0–2.5)
ALT: 13 U/L (ref 6–29)
AST: 13 U/L (ref 10–35)
Albumin: 4.3 g/dL (ref 3.6–5.1)
Alkaline phosphatase (APISO): 83 U/L (ref 37–153)
BUN: 13 mg/dL (ref 7–25)
CO2: 26 mmol/L (ref 20–32)
Calcium: 9.8 mg/dL (ref 8.6–10.4)
Chloride: 106 mmol/L (ref 98–110)
Creat: 0.83 mg/dL (ref 0.50–1.05)
Globulin: 2.9 g/dL (calc) (ref 1.9–3.7)
Glucose, Bld: 128 mg/dL — ABNORMAL HIGH (ref 65–99)
Potassium: 4.1 mmol/L (ref 3.5–5.3)
Sodium: 142 mmol/L (ref 135–146)
Total Bilirubin: 0.3 mg/dL (ref 0.2–1.2)
Total Protein: 7.2 g/dL (ref 6.1–8.1)
eGFR: 77 mL/min/{1.73_m2} (ref >=60)

## 2022-01-26 LAB — LIPID PANEL
Cholesterol: 208 mg/dL — ABNORMAL HIGH (ref <200)
HDL: 43 mg/dL — ABNORMAL LOW (ref >=50)
Non-HDL Cholesterol (Calc): 165 mg/dL (calc) — ABNORMAL HIGH (ref <130)
Total CHOL/HDL Ratio: 4.8 (calc) (ref <5.0)
Triglycerides: 421 mg/dL — ABNORMAL HIGH (ref <150)

## 2022-01-26 LAB — TSH: TSH: 2.99 mIU/L (ref 0.40–4.50)

## 2022-01-26 LAB — VITAMIN B12: Vitamin B-12: 405 pg/mL (ref 200–1100)

## 2022-01-26 LAB — HEMOGLOBIN A1C
Hgb A1c MFr Bld: 7.2 % of total Hgb — ABNORMAL HIGH (ref <5.7)
Mean Plasma Glucose: 160 mg/dL
eAG (mmol/L): 8.9 mmol/L

## 2022-01-26 LAB — INSULIN, RANDOM: Insulin: 21.7 u[IU]/mL — ABNORMAL HIGH

## 2022-01-26 LAB — VITAMIN D 25 HYDROXY (VIT D DEFICIENCY, FRACTURES): Vit D, 25-Hydroxy: 61 ng/mL (ref 30–100)

## 2022-01-26 LAB — MAGNESIUM: Magnesium: 2 mg/dL (ref 1.5–2.5)

## 2022-01-28 NOTE — Progress Notes (Signed)
<><><><><><><><><><><><><><><><><><><><><><><><><><><><><><><><><> <><><><><><><><><><><><><><><><><><><><><><><><><><><><><><><><><> - Test results slightly outside the reference range are not unusual. If there is anything important, I will review this with you,  otherwise it is considered normal test values.  If you have further questions,  please do not hesitate to contact me at the office or via My Chart.  <><><><><><><><><><><><><><><><><><><><><><><><><><><><><><><><><>   - Total Chol =  208   is very high risk for Heart Attack /Stroke /Vascular Dementia     ( Ideal or Goal is less than 180 ! )  &  - question is whetheryou are taking the Nexlizet   ? ! ? !   - The cause is Bad Diet !  - But need to  get on a better diet    - Read or listen to   Dr Alden Benjamin 's book   " How Not to Die ! "     - Recommend a stricter plant based low cholesterol diet    - Cholesterol only comes from animal sources                                                                       - ie. meat,              dairy,        egg yolks  - Eat all the vegetables you want.   - Avoid Meat, Avoid Meat , Avoid Meat  ! ! !                                                                            - especially red meat - Beef AND Pork  - Avoid cheese & dairy - milk & ice cream.   - Cheese  & Egg yolks  are  the most concentrated form of trans-fats which                                                                          is the worst thing to clog up our arteries.   - Veggie cheese is OK which can be found in                                              the fresh produce section at  [employer] or [employer] or [employer] ============================================================ ============================================================                       You absolutely must get on a better " plant based  diet  &       STOP eating Animal Products if you want to live !  ============================================================ ============================================================  Suggest a better diet  to a lowering your cholesterol ,   But  your diet is the most important of all  - All the medicine in the world can't fix a bad diet,               if you continue to eat animal products   I suggest that you  RESTART your Ozempic at a higher dose                                                     to help get your sugars & Cholesterol in better control  <><><><><><><><><><><><><><><><><><><><><><><><><><><><><><><><><> <><><><><><><><><><><><><><><><><><><><><><><><><><><><><><><><><>  - Also Triglycerides (   421  ) or fats in blood are too high  ( a result od "bad" diet !  )                (   Ideal or  Goal is less than 150  !  )    - Recommend avoid fried & greasy foods,  sweets / candy,   - Avoid white rice  (brown or wild rice or Quinoa is OK),   - Avoid white potatoes  (sweet potatoes are OK)   - Avoid anything made from white flour  - bagels, doughnuts, rolls, buns, biscuits, white and   wheat breads, pizza crust and traditional  pasta made of white flour & egg white  - (vegetarian pasta or spinach or wheat pasta is OK).    - Multi-grain bread is OK - like multi-grain flat bread or  sandwich thins.   - Avoid alcohol in excess.   - Exercise is also important. <><><><><><><><><><><><><><><><><><><><><><><><><><><><><><><><><> <><><><><><><><><><><><><><><><><><><><><><><><><><><><><><><><><>  -  A1c = 7.2%  ( ideal  or goal is less than   5.7% )  - Only you can fix this with a better plant based diet  !  <><><><><><><><><><><><><><><><><><><><><><><><><><><><><><><><><> <><><><><><><><><><><><><><><><><><><><><><><><><><><><><><><><><>  - Vitamin D  = 61 - Good level -- Pleas continue dose same     <><><><><><><><><><><><><><><><><><><><><><><><><><><><><><><><><> <><><><><><><><><><><><><><><><><><><><><><><><><><><><><><><><><>   -  Vitamin B12 =    405 is  Very Low  (Ideal or Goal   Vit B12 is between 450 - 1,100   )   Low Vit B12 may be associated with Anemia , Fatigue,   Peripheral Neuropathy, Dementia, "Brain Fog", & Depression   - Recommend take a sub-lingual form of Vitamin B12 tablet   1,000 to 5,000 mcg tab that                                                 you dissolve under your tongue / Daily   - Can get Baron Sane - best price at [employer] or on [employer] <><><><><><><><><><><><><><><><><><><><><><><><><><><><><><><><><> <><><><><><><><><><><><><><><><><><><><><><><><><><><><><><><><><>  -  All Else - CBC - Kidneys - Electrolytes - Liver - Magnesium & Thyroid    - all  Normal / OK <><><><><><><><><><><><><><><><><><><><><><><><><><><><><><><><><> <><><><><><><><><><><><><><><><><><><><><><><><><><><><><><><><><>

## 2022-02-03 ENCOUNTER — Encounter: Payer: Self-pay | Admitting: Internal Medicine

## 2022-02-17 ENCOUNTER — Encounter: Payer: Self-pay | Admitting: Internal Medicine

## 2022-03-03 ENCOUNTER — Encounter: Payer: Self-pay | Admitting: Internal Medicine

## 2022-03-07 ENCOUNTER — Encounter: Payer: Self-pay | Admitting: Nurse Practitioner

## 2022-03-07 ENCOUNTER — Other Ambulatory Visit: Payer: Self-pay

## 2022-03-07 ENCOUNTER — Ambulatory Visit (INDEPENDENT_AMBULATORY_CARE_PROVIDER_SITE_OTHER): Payer: Medicare Other | Admitting: Nurse Practitioner

## 2022-03-07 VITALS — HR 75 | Temp 97.2°F

## 2022-03-07 DIAGNOSIS — R051 Acute cough: Secondary | ICD-10-CM

## 2022-03-07 DIAGNOSIS — R6889 Other general symptoms and signs: Secondary | ICD-10-CM

## 2022-03-07 DIAGNOSIS — J0110 Acute frontal sinusitis, unspecified: Secondary | ICD-10-CM | POA: Diagnosis not present

## 2022-03-07 DIAGNOSIS — Z1152 Encounter for screening for COVID-19: Secondary | ICD-10-CM

## 2022-03-07 LAB — POCT INFLUENZA A/B
Influenza A, POC: NEGATIVE
Influenza B, POC: NEGATIVE

## 2022-03-07 LAB — POC COVID19 BINAXNOW: SARS Coronavirus 2 Ag: POSITIVE — AB

## 2022-03-07 MED ORDER — PREDNISONE 10 MG PO TABS: ORAL_TABLET | ORAL | 0 refills | Status: DC

## 2022-03-07 MED ORDER — PROMETHAZINE-DM 6.25-15 MG/5ML PO SYRP: 5.0000 mL | ORAL_SOLUTION | Freq: Four times a day (QID) | ORAL | 0 refills | Status: DC | PRN

## 2022-03-07 NOTE — Progress Notes (Signed)
THIS ENCOUNTER IS A VIRTUAL VISIT DUE TO COVID-19 - PATIENT WAS NOT SEEN IN THE OFFICE.  PATIENT HAS CONSENTED TO VIRTUAL VISIT / TELEMEDICINE VISIT   Virtual Visit via telephone Note  I connected with  Joyce Shannon on 03/07/2022 by telephone.  I verified that I am speaking with the correct person using two identifiers.    I discussed the limitations of evaluation and management by telemedicine and the availability of in person appointments. The patient expressed understanding and agreed to proceed.  History of Present Illness:  Pulse 75   Temp (!) 97.2 F (36.2 C)   SpO2 96%   67 y.o. patient contacted office reporting URI sx nasal congestion, bilateral earache, cough, PND. she tested positive by rapid covid test in office. OV was conducted by telephone to minimize exposure. This patient has been vaccinated for covid 19, last 11/2019  Sx began 4 days ago with nasal congestion  Treatments tried so far: alka seltzer plus, tylenol  She has responded to low dose steroid taper before and states keeping her BG well controlled.    Exposures: unknown   Medications  Current Outpatient Medications (Endocrine & Metabolic):    metFORMIN (GLUCOPHAGE-XR) 500 MG 24 hr tablet, Take 1 tablets 2 x /day with Breakfast & Lunch  and  2 tablets at Supper for Diabetes   Semaglutide,0.25 or 0.'5MG'$ /DOS, 2 MG/3ML SOPN, Inject 0.25 mg into the skin once a week. (Patient not taking: Reported on 01/25/2022)  Current Outpatient Medications (Cardiovascular):    amLODipine-benazepril (LOTREL) 10-40 MG capsule, TAKE 1 CAPSULE BY MOUTH EVERY DAY FOR BLOOD PRESSURE   Bempedoic Acid-Ezetimibe (NEXLIZET) 180-10 MG TABS, Take 1 tablet by mouth daily. (Patient not taking: Reported on 03/07/2022)  Current Outpatient Medications (Respiratory):    fexofenadine (ALLEGRA) 180 MG tablet, Take 180 mg by mouth daily.   fluticasone (FLONASE) 50 MCG/ACT nasal spray, Place 1 spray into both nostrils 2 (two) times daily as  needed.  Current Outpatient Medications (Analgesics):    aspirin EC 81 MG tablet, Take 81 mg by mouth daily. Every other day  Current Outpatient Medications (Hematological):    Cyanocobalamin (B-12 PO), Take by mouth daily.  Current Outpatient Medications (Other):    ALPRAZolam (XANAX) 0.5 MG tablet, Take 1/2 to 1 tablet twice daily as needed for severe anxiety.   blood glucose meter kit and supplies KIT, Test blood sugar once daily and as needed.   buPROPion (WELLBUTRIN XL) 150 MG 24 hr tablet, TAKE 1 TABLET BY MOUTH DAILY FOR MOOD   vitamin C (ASCORBIC ACID) 500 MG tablet, Take 500 mg by mouth daily.   VITAMIN D, CHOLECALCIFEROL, PO, Take 5,000 Units by mouth.   Allergies:  Allergies  Allergen Reactions   Other     Tylenol #3   Rosuvastatin     Myalgia    Tylox [Oxycodone-Acetaminophen]     Problem list She has Essential hypertension; CKD stage 2 due to type 2 diabetes mellitus ([place]); Major depressive disorder, recurrent episode, in partial remission with anxious distress ([place]); Medication management; Hyperlipidemia associated with type 2 diabetes mellitus ([place]); Tobacco use (30+ years, current smoker) ; Obesity (BMI 30.0-34.9); Vitamin D deficiency; Type 2 diabetes mellitus ([place]); Family history of abdominal aortic aneurysm (AAA); Aortic arch atherosclerosis ([place]); Emphysema of lung ([place]); Osteopenia; B12 deficiency; Diverticulosis; History of adenomatous polyp of colon; Varicose veins of leg with pain, right; and Statin myopathy on their problem list.   Social History:   reports that she has been smoking  cigarettes. She started smoking about 43 years ago. She has a 32.25 pack-year smoking history. She has never used smokeless tobacco. She reports that she does not drink alcohol and does not use drugs.  Observations/Objective:  General : Well sounding patient in no apparent distress HEENT: no hoarseness, no cough for duration of visit Lungs: speaks in complete sentences, no  audible wheezing, no apparent distress Neurological: alert, oriented x 3 Psychiatric: pleasant, judgement appropriate   Assessment and Plan:  Covid 19 Covid 19 positive per rapid screening test in office Risk factors include: DM2, Emphysema, tobacco use Symptoms are: mild Due to co morbid conditions and risk factors, discussed antivirals  Immue support reviewed Vitamin C, Zinc, Vitamin D Take tylenol PRN temp 101+ Push hydration Regular ambulation or calf exercises exercises for clot prevention and 81 mg ASA unless contraindicated Sx supportive therapy suggested Follow up via mychart or telephone if needed Advised patient obtain O2 monitor; present to ED if persistently <90% or with severe dyspnea, CP, fever uncontrolled by tylenol, confusion, sudden decline       Should remain in isolation 5 days from testing positive and then wear a mask when around other people for the following 5 days  1. Flu-like symptoms Negative  - POCT Influenza A/B  2. Encounter for screening for COVID-19 Positive  - POC COVID-19 - predniSONE (DELTASONE) 10 MG tablet; 1 tab 3 x day for 2 days, then 1 tab 2 x day for 2 days, then 1 tab 1 x day for 3 days  Dispense: 13 tablet; Refill: 0 - promethazine-dextromethorphan (PROMETHAZINE-DM) 6.25-15 MG/5ML syrup; Take 5 mLs by mouth 4 (four) times daily as needed for cough.  Dispense: 240 mL; Refill: 0  3. Acute non-recurrent frontal sinusitis  - predniSONE (DELTASONE) 10 MG tablet; 1 tab 3 x day for 2 days, then 1 tab 2 x day for 2 days, then 1 tab 1 x day for 3 days  Dispense: 13 tablet; Refill: 0  4. Acute cough  - promethazine-dextromethorphan (PROMETHAZINE-DM) 6.25-15 MG/5ML syrup; Take 5 mLs by mouth 4 (four) times daily as needed for cough.  Dispense: 240 mL; Refill: 0  Discussed monitoring BG to keep well balanced while take steroid taper.   Follow Up Instructions:  I discussed the assessment and treatment plan with the patient. The patient was  provided an opportunity to ask questions and all were answered. The patient agreed with the plan and demonstrated an understanding of the instructions.   The patient was advised to call back or seek an in-person evaluation if the symptoms worsen or if the condition fails to improve as anticipated.  I provided 15 minutes of non-face-to-face time during this encounter.   Darrol Jump, NP

## 2022-03-09 ENCOUNTER — Other Ambulatory Visit (INDEPENDENT_AMBULATORY_CARE_PROVIDER_SITE_OTHER): Payer: Self-pay | Admitting: Vascular Surgery

## 2022-03-09 DIAGNOSIS — I83811 Varicose veins of right lower extremities with pain: Secondary | ICD-10-CM

## 2022-03-11 NOTE — Progress Notes (Deleted)
MRN : [id number]  Joyce Shannon is a 67 y.o. (1955-06-16) female who presents with chief complaint of varicose veins hurt.  History of Present Illness:   The patient is seen for evaluation of symptomatic varicose veins. The patient relates burning and stinging which worsened steadily throughout the course of the day, particularly with standing. The patient also notes an aching and throbbing pain over the varicosities, particularly with prolonged dependent positions. The symptoms are significantly improved with elevation.  The patient also notes that during hot weather the symptoms are greatly intensified. The patient states the pain from the varicose veins interferes with work, daily exercise, shopping and household maintenance. At this point, the symptoms are persistent and severe enough that they're having a negative impact on lifestyle and are interfering with daily activities.  There is no history of DVT, PE or superficial thrombophlebitis. There is no history of ulceration or hemorrhage. The patient denies a significant family history of varicose veins.  The patient has not worn graduated compression in the past. At the present time the patient has not been using over-the-counter analgesics. There is no history of prior surgical intervention or sclerotherapy.   No outpatient medications have been marked as taking for the 03/12/22 encounter (Appointment) with Delana Meyer, Dolores Lory, MD.    Past Medical History:  Diagnosis Date   Anemia    Asthma    B12 deficiency 04/23/2018   Depression    Diabetes mellitus without complication ([place])    Hyperlipidemia    Hypertension    Right bundle branch block 05/2005   Varicose vein of leg    Vitamin D deficiency     Past Surgical History:  Procedure Laterality Date   ABDOMINAL HYSTERECTOMY     Total    CESAREAN SECTION     CHOLECYSTECTOMY     ovarian cystectomy  1985   TONSILLECTOMY      Social History Social History    Tobacco Use   Smoking status: Every Day    Packs/day: 0.75    Years: 43.00    Total pack years: 32.25    Types: Cigarettes    Start date: 04/22/1978   Smokeless tobacco: Never   Tobacco comments:    currently <0.5 pack   Vaping Use   Vaping Use: Never used  Substance Use Topics   Alcohol use: No   Drug use: No    Family History Family History  Problem Relation Age of Onset   Heart disease Mother    Diabetes Mother    Breast cancer Mother 80       breast   Heart disease Father    Hypertension Father    Heart attack Father 61   Congestive Heart Failure Father    Prostate cancer Father 46       Prostate   Multiple sclerosis Brother    Diabetes Brother    Asthma Daughter    AAA (abdominal aortic aneurysm) Maternal Grandfather    Colon cancer Neg Hx     Allergies  Allergen Reactions   Other     Tylenol #3   Rosuvastatin     Myalgia    Tylox [Oxycodone-Acetaminophen]      REVIEW OF SYSTEMS (Negative unless checked)  Constitutional: '[]'$ Weight loss  '[]'$ Fever  '[]'$ Chills Cardiac: '[]'$ Chest pain   '[]'$ Chest pressure   '[]'$ Palpitations   '[]'$ Shortness of breath when laying flat   '[]'$ Shortness of breath with exertion. Vascular:  '[]'$ Pain in legs with walking   [  x]Pain in legs with standing  '[]'$ History of DVT   '[]'$ Phlebitis   '[]'$ Swelling in legs   '[x]'$ Varicose veins   '[]'$ Non-healing ulcers Pulmonary:   '[]'$ Uses home oxygen   '[]'$ Productive cough   '[]'$ Hemoptysis   '[]'$ Wheeze  '[]'$ COPD   '[]'$ Asthma Neurologic:  '[]'$ Dizziness   '[]'$ Seizures   '[]'$ History of stroke   '[]'$ History of TIA  '[]'$ Aphasia   '[]'$ Vissual changes   '[]'$ Weakness or numbness in arm   '[]'$ Weakness or numbness in leg Musculoskeletal:   '[]'$ Joint swelling   '[]'$ Joint pain   '[]'$ Low back pain Hematologic:  '[]'$ Easy bruising  '[]'$ Easy bleeding   '[]'$ Hypercoagulable state   '[]'$ Anemic Gastrointestinal:  '[]'$ Diarrhea   '[]'$ Vomiting  '[]'$ Gastroesophageal reflux/heartburn   '[]'$ Difficulty swallowing. Genitourinary:  '[]'$ Chronic kidney disease   '[]'$ Difficult urination  '[]'$ Frequent  urination   '[]'$ Blood in urine Skin:  '[]'$ Rashes   '[]'$ Ulcers  Psychological:  '[]'$ History of anxiety   '[]'$  History of major depression.  Physical Examination  There were no vitals filed for this visit. There is no height or weight on file to calculate BMI. Gen: WD/WN, NAD Head: [place]/AT, No temporalis wasting.  Ear/Nose/Throat: Hearing grossly intact, nares w/o erythema or drainage, pinna without lesions Eyes: PER, EOMI, sclera nonicteric.  Neck: Supple, no gross masses.  No JVD.  Pulmonary:  Good air movement, no audible wheezing, no use of accessory muscles.  Cardiac: RRR, precordium not hyperdynamic. Vascular:  Large varicosities present, greater than 10 mm ***.  Veins are tender to palpation  Mild venous stasis changes to the legs bilaterally.  Trace soft pitting edema CEAP C3sEpAsPr Vessel Right Left  Radial Palpable Palpable  Gastrointestinal: soft, non-distended. No guarding/no peritoneal signs.  Musculoskeletal: M/S 5/5 throughout.  No deformity.  Neurologic: CN 2-12 intact. Pain and light touch intact in extremities.  Symmetrical.  Speech is fluent. Motor exam as listed above. Psychiatric: Judgment intact, Mood & affect appropriate for pt's clinical situation. Dermatologic: Venous rashes no ulcers noted.  No changes consistent with cellulitis. Lymph : No lichenification or skin changes of chronic lymphedema.  CBC Lab Results  Component Value Date   WBC 10.0 01/25/2022   HGB 14.6 01/25/2022   HCT 41.2 01/25/2022   MCV 90.4 01/25/2022   PLT 252 01/25/2022    BMET    Component Value Date/Time   NA 142 01/25/2022 1533   K 4.1 01/25/2022 1533   CL 106 01/25/2022 1533   CO2 26 01/25/2022 1533   GLUCOSE 128 (H) 01/25/2022 1533   BUN 13 01/25/2022 1533   CREATININE 0.83 01/25/2022 1533   CALCIUM 9.8 01/25/2022 1533   GFRNONAA 68 07/18/2020 1446   GFRAA 78 07/18/2020 1446   CrCl cannot be calculated (Patient's most recent lab result is older than the maximum 21 days  allowed.).  COAG Lab Results  Component Value Date   INR 0.9 10/30/2016   INR CANCELED 10/30/2016    Radiology No results found.   Assessment/Plan There are no diagnoses linked to this encounter.   Hortencia Pilar, MD  03/11/2022 5:03 PM

## 2022-03-12 ENCOUNTER — Encounter (INDEPENDENT_AMBULATORY_CARE_PROVIDER_SITE_OTHER): Payer: Self-pay

## 2022-03-12 ENCOUNTER — Encounter (INDEPENDENT_AMBULATORY_CARE_PROVIDER_SITE_OTHER): Payer: Self-pay | Admitting: Vascular Surgery

## 2022-03-12 DIAGNOSIS — E1122 Type 2 diabetes mellitus with diabetic chronic kidney disease: Secondary | ICD-10-CM

## 2022-03-12 DIAGNOSIS — I10 Essential (primary) hypertension: Secondary | ICD-10-CM

## 2022-03-12 DIAGNOSIS — I83811 Varicose veins of right lower extremities with pain: Secondary | ICD-10-CM

## 2022-03-12 DIAGNOSIS — I700 Atherosclerosis of aorta: Secondary | ICD-10-CM

## 2022-03-12 DIAGNOSIS — J439 Emphysema, unspecified: Secondary | ICD-10-CM

## 2022-03-18 ENCOUNTER — Encounter: Payer: Self-pay | Admitting: Internal Medicine

## 2022-04-27 ENCOUNTER — Encounter (INDEPENDENT_AMBULATORY_CARE_PROVIDER_SITE_OTHER): Payer: Medicare Other | Admitting: Nurse Practitioner

## 2022-04-27 ENCOUNTER — Encounter (INDEPENDENT_AMBULATORY_CARE_PROVIDER_SITE_OTHER): Payer: Medicare Other

## 2022-05-01 ENCOUNTER — Ambulatory Visit: Payer: BC Managed Care – PPO | Admitting: Nurse Practitioner

## 2022-05-19 ENCOUNTER — Other Ambulatory Visit: Payer: Self-pay | Admitting: Nurse Practitioner

## 2022-07-19 ENCOUNTER — Encounter: Payer: BC Managed Care – PPO | Admitting: Nurse Practitioner

## 2022-08-06 ENCOUNTER — Encounter: Payer: Self-pay | Admitting: Nurse Practitioner

## 2022-08-06 ENCOUNTER — Ambulatory Visit (INDEPENDENT_AMBULATORY_CARE_PROVIDER_SITE_OTHER): Payer: Medicare Other | Admitting: Nurse Practitioner

## 2022-08-06 VITALS — BP 136/78 | HR 79 | Temp 97.5°F | Ht 67.0 in | Wt 193.2 lb

## 2022-08-06 DIAGNOSIS — E1122 Type 2 diabetes mellitus with diabetic chronic kidney disease: Secondary | ICD-10-CM

## 2022-08-06 DIAGNOSIS — L03116 Cellulitis of left lower limb: Secondary | ICD-10-CM

## 2022-08-06 DIAGNOSIS — R600 Localized edema: Secondary | ICD-10-CM

## 2022-08-06 DIAGNOSIS — S80862A Insect bite (nonvenomous), left lower leg, initial encounter: Secondary | ICD-10-CM | POA: Diagnosis not present

## 2022-08-06 DIAGNOSIS — W57XXXA Bitten or stung by nonvenomous insect and other nonvenomous arthropods, initial encounter: Secondary | ICD-10-CM

## 2022-08-06 DIAGNOSIS — N181 Chronic kidney disease, stage 1: Secondary | ICD-10-CM

## 2022-08-06 MED ORDER — SULFAMETHOXAZOLE-TRIMETHOPRIM 800-160 MG PO TABS
1.0000 | ORAL_TABLET | Freq: Two times a day (BID) | ORAL | 0 refills | Status: AC
Start: 2022-08-06 — End: 2022-08-11

## 2022-08-06 MED ORDER — PREDNISONE 10 MG PO TABS
ORAL_TABLET | ORAL | 0 refills | Status: DC
Start: 2022-08-06 — End: 2022-08-30

## 2022-08-06 NOTE — Patient Instructions (Signed)
Cellulitis, Adult  Cellulitis is a skin infection. The infected area is often warm, red, swollen, and sore. It occurs most often on the legs, feet, and toes, but can happen on any part of the body. This condition can be life-threatening without treatment. It is very important to get treated right away. What are the causes? This condition is caused by bacteria. The bacteria enter through a break in the skin, such as: A cut. A burn. A bug bite. An animal bite. An open sore. A crack. What increases the risk? Having a weak body's defense system (immune system). Being older than 69 years old. Having a blood sugar problem (diabetes). Having a long-term liver disease (cirrhosis) or kidney disease. Being very overweight (obese). Having a skin problem, such as: An itchy rash. A rash caused by a fungus. A rash with blisters. Slow movement of blood in the veins (venous stasis). Fluid buildup below the skin (edema). This condition is more likely to occur in people who: Have open cuts, burns, bites, or scrapes on the skin. Have been treated with high-energy rays (radiation). Use IV drugs. What are the signs or symptoms? Skin that: Looks red or purple, or slightly darker than your usual skin color. Has streaks. Has spots. Is swollen. Is sore or painful when you touch it. Is warm. A fever. Chills. Blisters. Tiredness (fatigue). How is this treated? Medicines to treat infections or allergies. Rest. Placing cold or warm cloths on the skin. Staying in the hospital, if the condition is very bad. You may need medicines through an IV. Follow these instructions at home: Medicines Take over-the-counter and prescription medicines only as told by your doctor. If you were prescribed antibiotics, take them as told by your doctor. Do not stop using them even if you start to feel better. General instructions Drink enough fluid to keep your pee (urine) pale yellow. Do not touch or rub the  infected area. Raise (elevate) the infected area above the level of your heart while you are sitting or lying down. Return to your normal activities when your doctor says that it is safe. Place cold or warm cloths on the area as told by your doctor. Keep all follow-up visits. Your doctor will need to make sure that a more serious infection is not developing. Contact a doctor if: You have a fever. You do not start to get better after 1-2 days of treatment. Your bone or joint under the infected area starts to hurt after the skin has healed. Your infection comes back in the same area or another area. Signs of this may include: You have a swollen bump in the area. Your red area gets larger, turns dark in color, or hurts more. You have more fluid coming from the wound. Pus or a bad smell develops in your infected area. You have more pain. You feel sick and have muscle aches and weakness. You develop vomiting or watery poop that will not go away. Get help right away if: You see red streaks coming from the area. You notice the skin turns purple or black and falls off. These symptoms may be an emergency. Get help right away. Call 911. Do not wait to see if the symptoms will go away. Do not drive yourself to the hospital. This information is not intended to replace advice given to you by your health care provider. Make sure you discuss any questions you have with your health care provider. Document Revised: 09/19/2021 Document Reviewed: 09/19/2021 Elsevier Patient Education    2024 [employer]  

## 2022-08-06 NOTE — Progress Notes (Signed)
Assessment and Plan:  Joyce Shannon was seen today for an episodic visit.  Diagnoses and all order for this visit:  1. Insect bite of left lower leg, initial encounter   2. Cellulitis of left lower extremity/edema Start treatment with oral antibiotic Bactrim considering aggressive cellulitis and diabetic co-morbidity Start steroid taper - monitor BG. Erythematous area outlined with permanent marker. Continue to monitor for spreading of redness past outline - notify office or report to ER for IV abx treatment.  - predniSONE (DELTASONE) 10 MG tablet; 1 tab 3 x day for 2 days, then 1 tab 2 x day for 2 days, then 1 tab 1 x day for 3 days  Dispense: 13 tablet; Refill: 0 - sulfamethoxazole-trimethoprim (BACTRIM DS) 800-160 MG tablet; Take 1 tablet by mouth 2 (two) times daily for 5 days.  Dispense: 10 tablet; Refill: 0  3. Type 2 diabetes mellitus with stage 1 chronic kidney disease, without long-term current use of insulin (HCC) Keep BG well controlled to help decrease risk for infection. Continue medications as directed.  Notify office for further evaluation and treatment, questions or concerns if s/s fail to improve. The risks and benefits of my recommendations, as well as other treatment options were discussed with the patient today. Questions were answered.  Further disposition pending results of labs. Discussed med's effects and SE's.    Over 15 minutes of exam, counseling, chart review, and critical decision making was performed.   Future Appointments  Date Time Provider Department Center  12/18/2022 11:00 AM Adela Glimpse, NP [facility] None  08/07/2023  2:00 PM Arissa Fagin, Archie Patten, NP [facility] None    ------------------------------------------------------------------------------------------------------------------   HPI BP 136/78   Pulse 79   Temp (!) 97.5 F (36.4 C)   Ht 5\' 7"  (1.702 m)   Wt 193 lb 3.2 oz (87.6 kg)   SpO2 96%   BMI 30.26 kg/m   67 y.o.female  presents for evaluation of LLE cellulitis secondary to an insect bite x6 days ago.  She presented to the UC and was treated with topical Mupirocin abx ointment to which she has been using without effectiveness.  Bite has now spread in a circular shape over the side of her ankle and top of her foot.  Area is red and purple with swelling.  She denies fever, chills, N/V.  She is a diabetic.    Past Medical History:  Diagnosis Date   Anemia    Asthma    B12 deficiency 04/23/2018   Depression    Diabetes mellitus without complication ([facility])    Hyperlipidemia    Hypertension    Right bundle branch block 05/2005   Varicose vein of leg    Vitamin D deficiency      Allergies  Allergen Reactions   Other     Tylenol #3   Rosuvastatin     Myalgia    Tylox [Oxycodone-Acetaminophen]     Current Outpatient Medications on File Prior to Visit  Medication Sig   ALPRAZolam (XANAX) 0.5 MG tablet Take 1/2 to 1 tablet twice daily as needed for severe anxiety.   amLODipine-benazepril (LOTREL) 10-40 MG capsule TAKE 1 CAPSULE BY MOUTH EVERY DAY FOR BLOOD PRESSURE   aspirin EC 81 MG tablet Take 81 mg by mouth daily. Every other day   blood glucose meter kit and supplies KIT Test blood sugar once daily and as needed.   buPROPion (WELLBUTRIN XL) 150 MG 24 hr tablet TAKE 1 TABLET BY MOUTH DAILY FOR MOOD   Cyanocobalamin (  B-12 PO) Take by mouth daily.   fexofenadine (ALLEGRA) 180 MG tablet Take 180 mg by mouth daily.   fluticasone (FLONASE) 50 MCG/ACT nasal spray Place 1 spray into both nostrils 2 (two) times daily as needed.   metFORMIN (GLUCOPHAGE-XR) 500 MG 24 hr tablet Take 1 tablets 2 x /day with Breakfast & Lunch  and  2 tablets at Supper for Diabetes   Semaglutide,0.25 or 0.5MG /DOS, 2 MG/3ML SOPN Inject 0.25 mg into the skin once a week.   vitamin C (ASCORBIC ACID) 500 MG tablet Take 500 mg by mouth daily.   Bempedoic Acid-Ezetimibe (NEXLIZET) 180-10 MG TABS Take 1 tablet by mouth daily. (Patient not  taking: Reported on 03/07/2022)   promethazine-dextromethorphan (PROMETHAZINE-DM) 6.25-15 MG/5ML syrup Take 5 mLs by mouth 4 (four) times daily as needed for cough.   VITAMIN D, CHOLECALCIFEROL, PO Take 5,000 Units by mouth.    No current facility-administered medications on file prior to visit.    ROS: all negative except what is noted in the HPI.   Physical Exam:  BP 136/78   Pulse 79   Temp (!) 97.5 F (36.4 C)   Ht 5\' 7"  (1.702 m)   Wt 193 lb 3.2 oz (87.6 kg)   SpO2 96%   BMI 30.26 kg/m   General Appearance: NAD.  Awake, conversant and cooperative. Eyes: PERRLA, EOMs intact.  Sclera white.  Conjunctiva without erythema. Sinuses: No frontal/maxillary tenderness.  No nasal discharge. Nares patent.  ENT/Mouth: Ext aud canals clear.  Bilateral TMs w/DOL and without erythema or bulging. Hearing intact.  Posterior pharynx without swelling or exudate.  Tonsils without swelling or erythema.  Neck: Supple.  No masses, nodules or thyromegaly. Respiratory: Effort is regular with non-labored breathing. Breath sounds are equal bilaterally without rales, rhonchi, wheezing or stridor.  Cardio: RRR with no MRGs. Brisk peripheral pulses without edema.  Abdomen: Active BS in all four quadrants.  Soft and non-tender without guarding, rebound tenderness, hernias or masses. Lymphatics: Non tender without lymphadenopathy.  Musculoskeletal: Full ROM, 5/5 strength, normal ambulation.  No clubbing or cyanosis. Skin: LLE with moderate to severe erythema associate with purplish coloring spreading across top of metatarsals and lateral malleolus.  Center bite noted to be approximately 2 mm in diameter.  No pustule. Moderate edema. Warm to touch. Surrounding skin appropriate color for ethnicity.  Neuro: CN II-XII grossly normal. Normal muscle tone without cerebellar symptoms and intact sensation.   Psych: AO X 3,  appropriate mood and affect, insight and judgment.     Adela Glimpse, NP 2:16 PM [facility]  Adult & Adolescent Internal Medicine

## 2022-08-07 ENCOUNTER — Encounter: Payer: BC Managed Care – PPO | Admitting: Nurse Practitioner

## 2022-08-30 ENCOUNTER — Encounter: Payer: Self-pay | Admitting: Nurse Practitioner

## 2022-08-30 ENCOUNTER — Ambulatory Visit (INDEPENDENT_AMBULATORY_CARE_PROVIDER_SITE_OTHER): Payer: Medicare Other | Admitting: Nurse Practitioner

## 2022-08-30 VITALS — BP 130/78 | HR 76 | Temp 98.3°F | Ht 67.0 in | Wt 191.8 lb

## 2022-08-30 DIAGNOSIS — R6889 Other general symptoms and signs: Secondary | ICD-10-CM | POA: Diagnosis not present

## 2022-08-30 DIAGNOSIS — G4483 Primary cough headache: Secondary | ICD-10-CM

## 2022-08-30 DIAGNOSIS — R52 Pain, unspecified: Secondary | ICD-10-CM

## 2022-08-30 DIAGNOSIS — Z1152 Encounter for screening for COVID-19: Secondary | ICD-10-CM

## 2022-08-30 DIAGNOSIS — J069 Acute upper respiratory infection, unspecified: Secondary | ICD-10-CM | POA: Diagnosis not present

## 2022-08-30 LAB — POCT INFLUENZA A/B
Influenza A, POC: NEGATIVE
Influenza B, POC: NEGATIVE

## 2022-08-30 LAB — POC COVID19 BINAXNOW: SARS Coronavirus 2 Ag: NEGATIVE

## 2022-08-30 MED ORDER — AZITHROMYCIN 250 MG PO TABS
ORAL_TABLET | ORAL | 1 refills | Status: DC
Start: 2022-08-30 — End: 2022-12-18

## 2022-08-30 MED ORDER — PREDNISONE 10 MG PO TABS
ORAL_TABLET | ORAL | 0 refills | Status: DC
Start: 2022-08-30 — End: 2022-12-18

## 2022-08-30 NOTE — Patient Instructions (Signed)

## 2022-08-30 NOTE — Progress Notes (Signed)
Assessment and Plan:  Joyce Shannon was seen today for an episodic visit.  Diagnoses and all order for this visit:  Encounter for screening for COVID-19 Negative  - POC COVID-19  Flu-like symptoms Negative  - POCT Influenza A/B  Upper respiratory tract infection, unspecified type Continue to stay well hydrated to keep mucus thin and productive. Continue immune boosting support with Vitamin D, Vitamin C and Zinc.  - predniSONE (DELTASONE) 10 MG tablet; 1 tab 3 x day for 2 days, then 1 tab 2 x day for 2 days, then 1 tab 1 x day for 3 days  Dispense: 13 tablet; Refill: 0 - azithromycin (ZITHROMAX) 250 MG tablet; Take 2 tablets on  Day 1,  followed by 1 tablet  daily for 4 more days    for Sinusitis  /Bronchitis  Dispense: 6 each; Refill: 1  Primary cough headache/body aches Recommend 1,000 mg Acetaminophen up to QID PRN for HA and body aches.  Notify office for further evaluation and treatment, questions or concerns if s/s fail to improve. The risks and benefits of my recommendations, as well as other treatment options were discussed with the patient today. Questions were answered.  Further disposition pending results of labs. Discussed med's effects and SE's.    Over 15 minutes of exam, counseling, chart review, and critical decision making was performed.   Future Appointments  Date Time Provider Department Center  12/18/2022 11:00 AM Adela Glimpse, NP [facility] None  08/07/2023  2:00 PM Rachyl Wuebker, Archie Patten, NP [facility] None    ------------------------------------------------------------------------------------------------------------------   HPI BP 130/78   Pulse 76   Temp 98.3 F (36.8 C)   Ht 5\' 7"  (1.702 m)   Wt 191 lb 12.8 oz (87 kg)   SpO2 98%   BMI 30.04 kg/m    Patient complains of symptoms of a URI, possible sinusitis. Symptoms include achiness, congestion, cough described as nonproductive, facial pain, headache described as pressure, nasal congestion,  post nasal drip, sinus pressure, and sore throat. Onset of symptoms was 4 days ago, and has been gradually worsening since that time. Treatment to date: antihistamines and decongestants.  Denies any recent exposure.  Denies SOB, difficulty breathing.   Past Medical History:  Diagnosis Date   Anemia    Asthma    B12 deficiency 04/23/2018   Depression    Diabetes mellitus without complication (HCC)    Hyperlipidemia    Hypertension    Right bundle branch block 05/2005   Varicose vein of leg    Vitamin D deficiency      Allergies  Allergen Reactions   Other     Tylenol #3   Rosuvastatin     Myalgia    Tylox [Oxycodone-Acetaminophen]     Current Outpatient Medications on File Prior to Visit  Medication Sig   ALPRAZolam (XANAX) 0.5 MG tablet Take 1/2 to 1 tablet twice daily as needed for severe anxiety.   amLODipine-benazepril (LOTREL) 10-40 MG capsule TAKE 1 CAPSULE BY MOUTH EVERY DAY FOR BLOOD PRESSURE   aspirin EC 81 MG tablet Take 81 mg by mouth daily. Every other day   Bempedoic Acid-Ezetimibe (NEXLIZET) 180-10 MG TABS Take 1 tablet by mouth daily.   blood glucose meter kit and supplies KIT Test blood sugar once daily and as needed.   buPROPion (WELLBUTRIN XL) 150 MG 24 hr tablet TAKE 1 TABLET BY MOUTH DAILY FOR MOOD   Cyanocobalamin (B-12 PO) Take by mouth daily.   fexofenadine (ALLEGRA) 180 MG tablet Take 180 mg  by mouth daily.   fluticasone (FLONASE) 50 MCG/ACT nasal spray Place 1 spray into both nostrils 2 (two) times daily as needed.   metFORMIN (GLUCOPHAGE-XR) 500 MG 24 hr tablet Take 1 tablets 2 x /day with Breakfast & Lunch  and  2 tablets at Supper for Diabetes   predniSONE (DELTASONE) 10 MG tablet 1 tab 3 x day for 2 days, then 1 tab 2 x day for 2 days, then 1 tab 1 x day for 3 days   Semaglutide,0.25 or 0.5MG /DOS, 2 MG/3ML SOPN Inject 0.25 mg into the skin once a week.   vitamin C (ASCORBIC ACID) 500 MG tablet Take 500 mg by mouth daily.   No current  facility-administered medications on file prior to visit.    ROS: all negative except what is noted in the HPI.   Physical Exam:  BP 130/78   Pulse 76   Temp 98.3 F (36.8 C)   Ht 5\' 7"  (1.702 m)   Wt 191 lb 12.8 oz (87 kg)   SpO2 98%   BMI 30.04 kg/m   General Appearance: NAD.  Awake, conversant and cooperative. Eyes: PERRLA, EOMs intact.  Sclera white.  Conjunctiva without erythema. Sinuses: Frontal/maxillary tenderness.  No nasal discharge. Nares patent.  ENT/Mouth: Ext aud canals clear.  Bilateral TMs w/DOL and without erythema or bulging. Hearing intact.  Posterior pharynx without swelling or exudate.  Tonsils without swelling or erythema.  Neck: Supple.  No masses, nodules or thyromegaly. Respiratory: Effort is regular with non-labored breathing. Breath sounds are equal bilaterally without rales, rhonchi, wheezing or stridor.  Cardio: RRR with no MRGs. Brisk peripheral pulses without edema.  Abdomen: Active BS in all four quadrants.  Soft and non-tender without guarding, rebound tenderness, hernias or masses. Lymphatics: Non tender without lymphadenopathy.  Musculoskeletal: Full ROM, 5/5 strength, normal ambulation.  No clubbing or cyanosis. Skin: Appropriate color for ethnicity. Warm without rashes, lesions, ecchymosis, ulcers.  Neuro: CN II-XII grossly normal. Normal muscle tone without cerebellar symptoms and intact sensation.   Psych: AO X 3,  appropriate mood and affect, insight and judgment.     Adela Glimpse, NP 12:11 PM [facility]

## 2022-10-30 ENCOUNTER — Other Ambulatory Visit: Payer: Self-pay | Admitting: Nurse Practitioner

## 2022-10-30 DIAGNOSIS — I10 Essential (primary) hypertension: Secondary | ICD-10-CM

## 2022-12-18 ENCOUNTER — Encounter: Payer: Self-pay | Admitting: Nurse Practitioner

## 2022-12-18 ENCOUNTER — Ambulatory Visit (INDEPENDENT_AMBULATORY_CARE_PROVIDER_SITE_OTHER): Payer: Medicare Other | Admitting: Nurse Practitioner

## 2022-12-18 VITALS — BP 136/76 | HR 77 | Temp 98.0°F | Resp 16 | Ht 67.0 in | Wt 193.8 lb

## 2022-12-18 DIAGNOSIS — K5790 Diverticulosis of intestine, part unspecified, without perforation or abscess without bleeding: Secondary | ICD-10-CM

## 2022-12-18 DIAGNOSIS — Z0001 Encounter for general adult medical examination with abnormal findings: Secondary | ICD-10-CM | POA: Diagnosis not present

## 2022-12-18 DIAGNOSIS — Z0000 Encounter for general adult medical examination without abnormal findings: Secondary | ICD-10-CM

## 2022-12-18 DIAGNOSIS — I10 Essential (primary) hypertension: Secondary | ICD-10-CM

## 2022-12-18 DIAGNOSIS — R6889 Other general symptoms and signs: Secondary | ICD-10-CM | POA: Diagnosis not present

## 2022-12-18 DIAGNOSIS — I700 Atherosclerosis of aorta: Secondary | ICD-10-CM

## 2022-12-18 DIAGNOSIS — Z1231 Encounter for screening mammogram for malignant neoplasm of breast: Secondary | ICD-10-CM

## 2022-12-18 DIAGNOSIS — J439 Emphysema, unspecified: Secondary | ICD-10-CM

## 2022-12-18 DIAGNOSIS — Z720 Tobacco use: Secondary | ICD-10-CM

## 2022-12-18 DIAGNOSIS — E1169 Type 2 diabetes mellitus with other specified complication: Secondary | ICD-10-CM | POA: Diagnosis not present

## 2022-12-18 DIAGNOSIS — F3341 Major depressive disorder, recurrent, in partial remission: Secondary | ICD-10-CM

## 2022-12-18 DIAGNOSIS — M8580 Other specified disorders of bone density and structure, unspecified site: Secondary | ICD-10-CM

## 2022-12-18 DIAGNOSIS — G720 Drug-induced myopathy: Secondary | ICD-10-CM

## 2022-12-18 DIAGNOSIS — M85852 Other specified disorders of bone density and structure, left thigh: Secondary | ICD-10-CM

## 2022-12-18 DIAGNOSIS — E1122 Type 2 diabetes mellitus with diabetic chronic kidney disease: Secondary | ICD-10-CM | POA: Diagnosis not present

## 2022-12-18 DIAGNOSIS — F17210 Nicotine dependence, cigarettes, uncomplicated: Secondary | ICD-10-CM

## 2022-12-18 DIAGNOSIS — I83811 Varicose veins of right lower extremities with pain: Secondary | ICD-10-CM

## 2022-12-18 DIAGNOSIS — E559 Vitamin D deficiency, unspecified: Secondary | ICD-10-CM

## 2022-12-18 DIAGNOSIS — Z79899 Other long term (current) drug therapy: Secondary | ICD-10-CM

## 2022-12-18 DIAGNOSIS — Z860101 Personal history of adenomatous and serrated colon polyps: Secondary | ICD-10-CM

## 2022-12-18 NOTE — Progress Notes (Signed)
WELLNESS & FOLLOW UP  Assessment:   Annual Medicare Wellness  Due annually  Health maintenance reviewed Healthily lifestyle goals set Schedule mammogram Colonoscopy referral  Schedule diabetes eye and send report  Essential hypertension Continue amlodipine besy-benazepril HCL Discussed DASH (Dietary Approaches to Stop Hypertension) DASH diet is lower in sodium than a typical American diet. Cut back on foods that are high in saturated fat, cholesterol, and trans fats. Eat more whole-grain foods, fish, poultry, and nuts Remain active and exercise as tolerated daily.  Monitor BP at home-Call if greater than 130/80.  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 Stay well hydrated. Avoid high salt foods. Avoid NSAIDS. Keep BP and BG well controlled.   Take medications as prescribed. Remain active and exercise as tolerated daily. Maintain weight.  Continue to monitor. Check CMP/GFR   Hyperlipidemia associated with T2DM (HCC) Statin myalgias/Aortic atherosclerosis (HCC)  - Per CT 07/2019 Hx of statin intolerance/myopathy Discussed lifestyle modifications. Recommended diet heavy in fruits and veggies, omega 3's. Decrease consumption of animal meats, cheeses, and dairy products. Remain active and exercise as tolerated. Continue to monitor. Check lipids/TSH   Tobacco use (30+ years, current smoker) Actively cutting back on smoking, wellbutrin helpful, declines chantix - continue to work on reducing  gradually Smoking cessation instruction/counseling given:  counseled patient on the dangers of tobacco use, advised patient to stop smoking, and reviewed strategies to maximize success Continue annual low dose screening CT - Due    Emphysema (HCC) Per imaging; smoking cessation discussed Denies notable sx, no inhalers, monitoring  Vitamin D deficiency Continue supplement for goal of 60-100 Monitor Vitamin D levels  Osteopenia - left femur (10/2019 T Score -1.9) DEXA Due  Pursue a combination of weight-bearing exercises and strength training. Patients with severe mobility impairment should be referred for physical therapy. Advised on fall prevention measures including proper lighting in all rooms, removal of area rugs and floor clutter, use of walking devices as deemed appropriate, avoidance of uneven walking surfaces. Smoking cessation and moderate alcohol consumption if applicable Consume 800 to 1000 IU of vitamin D daily with a goal vitamin D serum value of 30 ng/mL or higher. Aim for 1000 to 1200 mg of elemental calcium daily through supplements and/or dietary sources.  Anxiety with Depression  Continue Wellbutrin.   Xanax PRN Reviewed relaxation techniques.  Sleep hygiene. Recommended Cognitive Behavioral Therapy (CBT) PRN. Recommended mindfulness meditation and exercise.   Encouraged personality growth wand development through coping techniques and problem-solving skills. Limit/Decrease/Monitor drug/alcohol intake.     Diverticulosis Encourage high fiber diet; Stay well hydrated  History of adenomatous colon polyps Overdue colonoscopy, Dr. Adela Lank Referral placed   Obesity with DM2 Discussed appropriate BMI Diet modification. Physical activity. Encouraged/praised to build confidence.  Right varicose veins with pain Daily compression 20-30 mmHg  Screening for breast cancer Mammogram due - order placed  Orders Placed This Encounter  Procedures   MM Digital  Screening    Standing Status:   Future    Standing Expiration Date:   12/18/2023    Order Specific Question:  Reason for Exam (SYMPTOM  OR DIAGNOSIS REQUIRED)    Answer:   Screening for breast cancer    Order Specific Question:   Preferred imaging location?    Answer:   [facility]   DG Bone Density    Standing Status:   Future    Standing Expiration Date:   12/18/2023    Order Specific Question:   Reason for Exam (SYMPTOM  OR DIAGNOSIS REQUIRED)    Answer:   Osteopenia    Order Specific Question:   Preferred imaging location?    Answer:   [facility]   CT CHEST LUNG CA SCREEN LOW DOSE W/O CM    Standing Status:   Future    Standing Expiration Date:   12/18/2023    Order Specific Question:   Preferred Imaging Location?    Answer:   [facility]   CBC with Differential/Platelet   COMPLETE METABOLIC PANEL WITH GFR   Lipid panel   Hemoglobin A1c   VITAMIN D 25 Hydroxy (Vit-D Deficiency, Fractures)   Ambulatory referral to Gastroenterology    Referral Priority:   Routine    Referral Type:   Consultation    Referral Reason:   Specialty Services Required    Referred to Provider:   Benancio Deeds, MD    Number of Visits Requested:   1   Notify office for further evaluation and treatment, questions or concerns if any reported s/s fail to improve.   The patient was advised to call back or seek an in-person evaluation if any symptoms worsen or if the condition fails to improve as anticipated.   Further disposition pending results of labs. Discussed med's effects and SE's.    I discussed the assessment and treatment plan with the patient. The patient was provided an opportunity to ask questions and all were answered. The patient agreed with the plan and demonstrated an understanding of the instructions.  Discussed med's effects and SE's. Screening labs and tests as requested with regular follow-up as recommended.  I provided 40 minutes of face-to-face time during  this encounter including counseling, chart review, and critical decision making was preformed.  Today's Plan of Care is based on a patient-centered health care approach known as shared decision making - the decisions, tests and treatments allow for patient preferences and values to be balanced with clinical evidence.    Future Appointments  Date Time Provider Department Center  08/07/2023  2:00 PM Adela Glimpse, NP [facility] None  12/18/2023 11:00 AM Adela Glimpse, NP [facility] None    Plan:   During the course of the visit the patient was educated and counseled about appropriate screening and preventive services including:   Pneumococcal vaccine  Prevnar 13 Influenza vaccine Td vaccine Screening electrocardiogram Bone densitometry screening Colorectal cancer screening Diabetes screening Glaucoma screening Nutrition counseling  Advanced directives: requested   Subjective:  Joyce Shannon is a 67 y.o. female who presents for annual wellness visit and follow up. She has Essential hypertension; CKD stage 2 due to type 2 diabetes mellitus (HCC); Major depressive disorder, recurrent episode, in partial remission with anxious distress (HCC); Medication management; Hyperlipidemia associated with type 2 diabetes mellitus (HCC); Tobacco use (30+ years, current smoker) ; Obesity (BMI 30.0-34.9); Vitamin D deficiency; Type 2 diabetes mellitus (HCC); Family history of abdominal aortic aneurysm (AAA); Aortic arch atherosclerosis (HCC); Emphysema of lung (HCC); Osteopenia; B12 deficiency; Diverticulosis; History of adenomatous polyp of colon; Varicose veins of leg with pain, right; and Statin myopathy on  their problem list.  Overall she reports feeling well today.  She has no new concerns at this time.  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   She has hx of depression/anxiety, r/t being primary caregiver for husband, reports mood has been labile since his passing, doing some journaling. On wellbutrin 150 mg daily and doing well with this, very rare use of xanax, typically rare use in the evening. She reports is sleeping fairly at night, improving, denies SE.    She continues to smoke <0.5 pack a day, down from previous 1 pack a day; discussed risks associated with smoking, actively working on cutting down. Wellbutrin has been very helpful. She has 30 pack year history, Ct lung 07/19/2021 showed new 5.9 nodular opacity in the paraspinal right lower lobe, most likely atelectasis or scarring.  She had a follow up recommend for 6 months, which she has not followed through with.  Continues to have emphysema.   Hx of L varicose veins treated remotely per patient, has done well since, recently she reports having more R lower leg varicosities with tenderness and aching despite compression hose.  Overall stable at this this point and continues to follow PRN>   BMI is Body mass index is 30.35 kg/m., she has been working on diet and exercise. Wt Readings from Last 3 Encounters:  12/18/22 193 lb 12.8 oz (87.9 kg)  08/30/22 191 lb 12.8 oz (87 kg)  08/06/22 193 lb 3.2 oz (87.6 kg)   Aortic atherosclerosis per chest CT 07/2019  Her blood pressure has been controlled at home, today their BP is BP: 136/76 She does workout. She denies chest pain, shortness of breath, dizziness.   She is on cholesterol medication (was on rosuvastatin 5 mg daily, had myalgia, she recalls has also failed Lovastatin recently prescribed zeita 10 mg but no longer taking) Her cholesterol is not at goal.  Nexlizet was sent but not covered. The cholesterol last visit was:   Lab Results  Component Value Date   CHOL 208 (H)  01/25/2022   HDL 43 (L) 01/25/2022   LDLCALC  01/25/2022     Comment:     . LDL cholesterol not calculated. Triglyceride levels greater than 400 mg/dL invalidate calculated LDL results. . Reference range: <100 . Desirable range <100 mg/dL for primary prevention;   <70 mg/dL for patients with CHD or diabetic patients  with > or = 2 CHD risk factors. Marland Kitchen LDL-C is now calculated using the Martin-Hopkins  calculation, which is a validated novel method providing  better accuracy than the Friedewald equation in the  estimation of LDL-C.  Horald Pollen et al. Lenox Ahr. 1610;960(45): 2061-2068  (http://education.[facility].com/faq/FAQ164)    TRIG 421 (H) 01/25/2022   CHOLHDL 4.8 01/25/2022    She has been working on diet and exercise for T2 diabetes (on metformin 500 mg BID, doesn't take 3 as prescribed), on bASA, ACEi and denies increased appetite, nausea, paresthesia of the feet, polydipsia and polyuria.  Stain  myopathy.  Doesn't have glucometer supplies -  Last A1C in the office was:  Lab Results  Component Value Date   HGBA1C 7.2 (H) 01/25/2022   She has CKD II associated with T2DM  monitored at this office. She is on ACEi. Last GFR: Lab Results  Component Value Date   EGFR 77 01/25/2022   Patient is on Vitamin D supplement, reports has been taking 5000 IU daily  Lab Results  Component Value Date   VD25OH 73 01/25/2022     She is on a B complex since last year  Lab Results  Component Value Date   VITAMINB12 405 01/25/2022      Medication Review: Current Outpatient Medications on File Prior to Visit  Medication Sig Dispense Refill   ALPRAZolam (XANAX) 0.5 MG tablet Take 1/2 to 1 tablet twice daily as needed for severe anxiety. 30 tablet 0   amLODipine-benazepril (LOTREL) 10-40 MG capsule TAKE 1 CAPSULE BY MOUTH EVERY DAY FOR BLOOD PRESSURE 30 capsule 11   aspirin EC 81 MG tablet Take 81 mg by mouth daily. Every other day     blood glucose meter kit and supplies KIT Test  blood sugar once daily and as needed. 1 each PRN   buPROPion (WELLBUTRIN XL) 150 MG 24 hr tablet TAKE 1 TABLET BY MOUTH DAILY FOR MOOD 30 tablet 11   Cyanocobalamin (B-12 PO) Take by mouth daily.     fexofenadine (ALLEGRA) 180 MG tablet Take 180 mg by mouth daily.     fluticasone (FLONASE) 50 MCG/ACT nasal spray Place 1 spray into both nostrils 2 (two) times daily as needed.     metFORMIN (GLUCOPHAGE-XR) 500 MG 24 hr tablet Take 1 tablets 2 x /day with Breakfast & Lunch  and  2 tablets at Supper for Diabetes 360 tablet 3   vitamin C (ASCORBIC ACID) 500 MG tablet Take 500 mg by mouth daily.     azithromycin (ZITHROMAX) 250 MG tablet Take 2 tablets on  Day 1,  followed by 1 tablet  daily for 4 more days    for Sinusitis  /Bronchitis (Patient not taking: Reported on 12/18/2022) 6 each 1   predniSONE (DELTASONE) 10 MG tablet 1 tab 3 x day for 2 days, then 1 tab 2 x day for 2 days, then 1 tab 1 x day for 3 days (Patient not taking: Reported on 12/18/2022) 13 tablet 0   No current facility-administered medications on file prior to visit.    Allergies  Allergen Reactions   Other     Tylenol #3   Rosuvastatin     Myalgia    Tylox [Oxycodone-Acetaminophen]     Current Problems (verified) Patient Active Problem List   Diagnosis Date Noted   Varicose veins of leg with pain, right 07/18/2021   Statin myopathy 07/18/2021   B12 deficiency 07/16/2020   Diverticulosis 07/16/2020   History of adenomatous polyp of colon 07/16/2020   Osteopenia 11/02/2019   Aortic arch atherosclerosis (HCC) 07/21/2019   Emphysema of lung (HCC) 07/21/2019   Family history of abdominal aortic aneurysm (AAA) 04/22/2018   Type 2 diabetes mellitus (HCC) 10/04/2017   Obesity (BMI 30.0-34.9) 06/21/2014   Vitamin D deficiency 06/21/2014   CKD stage 2 due to type 2 diabetes mellitus (HCC) 01/07/2014   Major depressive disorder, recurrent episode, in partial remission with anxious distress (HCC) 01/07/2014   Medication  management 01/07/2014   Hyperlipidemia associated with type 2 diabetes mellitus (HCC) 01/07/2014   Tobacco use (30+ years, current smoker)  01/07/2014   Essential hypertension 01/22/2012    Screening Tests Immunization History  Administered Date(s) Administered   Fluad Quad(high Dose 65+) 10/19/2020   Influenza, High Dose Seasonal PF 11/03/2019   Influenza-Unspecified  10/28/2014, 11/06/2018, 10/17/2021   PFIZER(Purple Top)SARS-COV-2 Vaccination 03/21/2019, 04/15/2019, 11/26/2019   Pneumococcal Conjugate-13 08/20/2019   Pneumococcal Polysaccharide-23 03/19/2016   Td 10/07/2003   Tdap 03/19/2016   Health Maintenance  Topic Date Due   Zoster Vaccines- Shingrix (1 of 2) Never done   Colonoscopy  12/07/2018   OPHTHALMOLOGY EXAM  06/30/2020   DEXA SCAN  11/01/2021   Diabetic kidney evaluation - Urine ACR  07/19/2022   FOOT EXAM  07/19/2022   HEMOGLOBIN A1C  07/27/2022   INFLUENZA VACCINE  09/06/2022   COVID-19 Vaccine (4 - 2023-24 season) 10/07/2022   MAMMOGRAM  11/12/2022   Lung Cancer Screening  11/21/2022   Diabetic kidney evaluation - eGFR measurement  01/26/2023   Medicare Annual Wellness (AWV)  12/18/2023   DTaP/Tdap/Td (3 - Td or Tdap) 03/19/2026   Hepatitis C Screening  Completed   HPV VACCINES  Aged Out   Pneumonia Vaccine 48+ Years old  Discontinued   Shingrix: discussed, can get at pharmacy   LMP s/p hysterectomy Pap: 2008 before hysterectomy, DONE [facility]: 11/2020 breast center - overdue DEXA: 10/2019 T score -1.9, -overdue  Colonoscopy:12/2015 numerous polyps, Dr. Adela Lank WAS DUE 12/2018 -  plans to follow up  CT lungs 11/2021 - Overdue as of 05/2022 AAA [place]: normal 08/2019  Names of Other Physician/Practitioners you currently use: 1. [facility] here for primary care 2. [facility], eye doctor, last visit 07/01/2019 abstracted - will call to schedule  3. Dr. Dan Humphreys, dentist, last visit 2021, reminded to schedule, no new  skin issues  Patient Care Team: Lucky Cowboy, MD as PCP - General (Internal Medicine) Armbruster, Willaim Rayas, MD as Consulting Physician (Gastroenterology)  SURGICAL HISTORY She  has a past surgical history that includes Cesarean section; Abdominal hysterectomy; Cholecystectomy; ovarian cystectomy (1985); and Tonsillectomy. FAMILY HISTORY Her family history includes AAA (abdominal aortic aneurysm) in her maternal grandfather; Asthma in her daughter; Breast cancer (age of onset: 60) in her mother; Congestive Heart Failure in her father; Diabetes in her brother and mother; Heart attack (age of onset: 77) in her father; Heart disease in her father and mother; Hypertension in her father; Multiple sclerosis in her brother; Prostate cancer (age of onset: 86) in her father. SOCIAL HISTORY She  reports that she has been smoking cigarettes. She started smoking about 44 years ago. She has a 33.5 pack-year smoking history. She has never used smokeless tobacco. She reports that she does not drink alcohol and does not use drugs.   Review of Systems  Constitutional:  Negative for malaise/fatigue and weight loss.  HENT:  Negative for hearing loss and tinnitus.   Eyes:  Negative for blurred vision and double vision.  Respiratory:  Negative for cough, sputum production, shortness of breath and wheezing.   Cardiovascular:  Negative for chest pain, palpitations, orthopnea, claudication, leg swelling and PND.       Tender R varicose veins, aching  Gastrointestinal:  Negative for abdominal pain, blood in stool, constipation, diarrhea, heartburn, melena, nausea and vomiting.  Genitourinary: Negative.   Musculoskeletal:  Negative for falls, joint pain and myalgias.  Skin:  Negative for rash.  Neurological:  Negative for dizziness, tingling, sensory change, weakness and headaches.  Endo/Heme/Allergies:  Negative for polydipsia.  Psychiatric/Behavioral: Negative.  Negative for depression, memory loss, substance  abuse and suicidal ideas. The patient is not nervous/anxious and does not have insomnia.   All other systems reviewed and are negative.    Objective:  Today's Vitals   12/18/22 1045  BP: 136/76  Pulse: 77  Resp: 16  Temp: 98 F (36.7 C)  SpO2: 96%  Weight: 193 lb 12.8 oz (87.9 kg)  Height: 5\' 7"  (1.702 m)   Body mass index is 30.35 kg/m.  General appearance: alert, no distress, WD/WN, female HEENT: normocephalic, sclerae anicteric, TMs pearly, nares patent, no discharge or erythema, pharynx normal Oral cavity: MMM, good dentition, no lesions Neck: supple, no lymphadenopathy, no thyromegaly, no masses Heart: RRR, normal S1, S2, no murmurs Lungs: CTA bilaterally, no wheezes, rhonchi, or rales Abdomen: +bs, soft, non tender, non distended, no masses, no hepatomegaly, no splenomegaly Musculoskeletal: nontender, no swelling, no obvious deformity. Symmetrical ROM. Steady non-antalgic gait Extremities: no edema, no cyanosis, no clubbing. + tender R lower leg varicose veins.  Pulses: 2+ symmetric, upper and lower extremities, normal cap refill Neurological: alert, oriented x 3, CN2-12 intact, strength normal upper extremities and lower extremities, sensation normal throughout, DTRs 2+ throughout, no cerebellar signs, gait normal Psychiatric: normal affect, behavior normal, pleasant  Breasts: Getting mammograms, no concerns, declines GU: Defer Skin: warm, dry, intact; no rashes or concerning nevi, no ecchymoses. R temple with smooth 2 cm mobile mass to temple area.   MEDICARE WELLNESS OBJECTIVES: Physical activity: Current Exercise Habits: Home exercise routine Cardiac risk factors:   Depression/mood screen:      12/18/2022   11:53 AM  Depression screen PHQ 2/9  Decreased Interest 0  Down, Depressed, Hopeless 0  PHQ - 2 Score 0    ADLs:     12/18/2022   11:53 AM 01/25/2022   12:34 AM  In your present state of health, do you have any difficulty performing the following  activities:  Hearing? 0 0  Vision? 0 0  Difficulty concentrating or making decisions? 0 0  Walking or climbing stairs? 0 0  Dressing or bathing? 0 0  Doing errands, shopping? 0 0     Cognitive Testing  Alert? Yes  Normal Appearance?Yes  Oriented to person? Yes  Place? Yes   Time? Yes  Recall of three objects?  Yes  Can perform simple calculations? Yes  Displays appropriate judgment?Yes  Can read the correct time from a watch face?Yes  EOL planning: Does Patient Have a Medical Advance Directive?: Yes Type of Advance Directive: Living will    Medicare Attestation I have personally reviewed: The patient's medical and social history Their use of alcohol, tobacco or illicit drugs Their current medications and supplements The patient's functional ability including ADLs,fall risks, home safety risks, cognitive, and hearing and visual impairment Diet and physical activities Evidence for depression or mood disorders  The patient's weight, height, BMI, and visual acuity have been recorded in the chart.  I have made referrals, counseling, and provided education to the patient based on review of the above and I have provided the patient with a written personalized care plan for preventive services.     Adela Glimpse, NP   12/18/2022

## 2022-12-18 NOTE — Patient Instructions (Signed)
Smoking Tobacco Information, Adult Smoking tobacco can be harmful to your health. Tobacco contains a toxic colorless chemical called nicotine. Nicotine causes changes in your brain that make you want more and more. This is called addiction. This can make it hard to stop smoking once you start. Tobacco also has other toxic chemicals that can hurt your body and raise your risk of many cancers. Menthol or "lite" tobacco or cigarette brands are not safer than regular brands. How can smoking tobacco affect me? Smoking tobacco puts you at risk for: Cancer. Smoking is most commonly associated with lung cancer, but can also lead to cancer in other parts of the body. Chronic obstructive pulmonary disease (COPD). This is a long-term lung condition that makes it hard to breathe. It also gets worse over time. High blood pressure (hypertension), heart disease, stroke, heart attack, and lung infections, such as pneumonia. Cataracts. This is when the lenses in the eyes become clouded. Digestive problems. This may include peptic ulcers, heartburn, and gastroesophageal reflux disease (GERD). Oral health problems, such as gum disease, mouth sores, and tooth loss. Loss of taste and smell. Smoking also affects how you look and smell. Smoking may cause: Wrinkles. Yellow or stained teeth, fingers, and fingernails. Bad breath. Bad-smelling clothes and hair. Smoking tobacco can also affect your social life, because: It may be challenging to find places to smoke when away from home. Many workplaces, restaurants, hotels, and public places are tobacco-free. Smoking is expensive. This is due to the cost of tobacco and the long-term costs of treating health problems from smoking. Secondhand smoke may affect those around you. Secondhand smoke can cause lung cancer, breathing problems, and heart disease. Children of smokers have a higher risk for: Sudden infant death syndrome (SIDS). Ear infections. Lung infections. What  actions can I take to prevent health problems? Quit smoking  Do not start smoking. Quit if you already smoke. Do not replace cigarette smoking with vaping devices, such as e-cigarettes. Make a plan to quit smoking and commit to it. Look for programs to help you, and ask your health care provider for recommendations and ideas. Set a date and write down all the reasons you want to quit. Let your friends and family know you are quitting so they can help and support you. Consider finding friends who also want to quit. It can be easier to quit with someone else, so that you can support each other. Talk with your health care provider about using nicotine replacement medicines to help you quit. These include gum, lozenges, patches, sprays, or pills. If you try to quit but return to smoking, stay positive. It is common to slip up when you first quit, so take it one day at a time. Be prepared for cravings. When you feel the urge to smoke, chew gum or suck on hard candy. Lifestyle Stay busy. Take care of your body. Get plenty of exercise, eat a healthy diet, and drink plenty of water. Find ways to manage your stress, such as meditation, yoga, exercise, or time spent with friends and family. Ask your health care provider about having regular tests (screenings) to check for cancer. This may include blood tests, imaging tests, and other tests. Where to find support To get support to quit smoking, consider: Asking your health care provider for more information and resources. Joining a support group for people who want to quit smoking in your local community. There are many effective programs that may help you to quit. Calling the smokefree.gov counselor   helpline at 1-800-QUIT-NOW (1-800-784-8669). Where to find more information You may find more information about quitting smoking from: Centers for Disease Control and Prevention: cdc.gov/tobacco Smokefree.gov: smokefree.gov American Lung Association:  freedomfromsmoking.org Contact a health care provider if: You have problems breathing. Your lips, nose, or fingers turn blue. You have chest pain. You are coughing up blood. You feel like you will faint. You have other health changes that cause you to worry. Summary Smoking tobacco can negatively affect your health, the health of those around you, your finances, and your social life. Do not start smoking. Quit if you already smoke. If you need help quitting, ask your health care provider. Consider joining a support group for people in your local community who want to quit smoking. There are many effective programs that may help you to quit. This information is not intended to replace advice given to you by your health care provider. Make sure you discuss any questions you have with your health care provider. Document Revised: 01/17/2021 Document Reviewed: 01/17/2021 Elsevier Patient Education  2024 Elsevier Inc.  

## 2022-12-19 LAB — CBC WITH DIFFERENTIAL/PLATELET
Absolute Lymphocytes: 3406 {cells}/uL (ref 850–3900)
Absolute Monocytes: 792 {cells}/uL (ref 200–950)
Basophils Absolute: 50 {cells}/uL (ref 0–200)
Basophils Relative: 0.5 %
Eosinophils Absolute: 188 {cells}/uL (ref 15–500)
Eosinophils Relative: 1.9 %
HCT: 43.6 % (ref 35.0–45.0)
Hemoglobin: 14.6 g/dL (ref 11.7–15.5)
MCH: 31.1 pg (ref 27.0–33.0)
MCHC: 33.5 g/dL (ref 32.0–36.0)
MCV: 92.8 fL (ref 80.0–100.0)
MPV: 10.4 fL (ref 7.5–12.5)
Monocytes Relative: 8 %
Neutro Abs: 5465 {cells}/uL (ref 1500–7800)
Neutrophils Relative %: 55.2 %
Platelets: 243 10*3/uL (ref 140–400)
RBC: 4.7 10*6/uL (ref 3.80–5.10)
RDW: 12.2 % (ref 11.0–15.0)
Total Lymphocyte: 34.4 %
WBC: 9.9 10*3/uL (ref 3.8–10.8)

## 2022-12-19 LAB — COMPLETE METABOLIC PANEL WITH GFR
AG Ratio: 1.6 (calc) (ref 1.0–2.5)
ALT: 9 U/L (ref 6–29)
AST: 13 U/L (ref 10–35)
Albumin: 4.2 g/dL (ref 3.6–5.1)
Alkaline phosphatase (APISO): 75 U/L (ref 37–153)
BUN: 15 mg/dL (ref 7–25)
CO2: 27 mmol/L (ref 20–32)
Calcium: 9.7 mg/dL (ref 8.6–10.4)
Chloride: 104 mmol/L (ref 98–110)
Creat: 0.78 mg/dL (ref 0.50–1.05)
Globulin: 2.6 g/dL (ref 1.9–3.7)
Glucose, Bld: 86 mg/dL (ref 65–99)
Potassium: 4.4 mmol/L (ref 3.5–5.3)
Sodium: 140 mmol/L (ref 135–146)
Total Bilirubin: 0.4 mg/dL (ref 0.2–1.2)
Total Protein: 6.8 g/dL (ref 6.1–8.1)
eGFR: 83 mL/min/{1.73_m2} (ref >=60)

## 2022-12-19 LAB — LIPID PANEL
Cholesterol: 201 mg/dL — ABNORMAL HIGH (ref <200)
HDL: 38 mg/dL — ABNORMAL LOW (ref >=50)
LDL Cholesterol (Calc): 108 mg/dL — ABNORMAL HIGH
Non-HDL Cholesterol (Calc): 163 mg/dL — ABNORMAL HIGH (ref <130)
Total CHOL/HDL Ratio: 5.3 (calc) — ABNORMAL HIGH (ref <5.0)
Triglycerides: 383 mg/dL — ABNORMAL HIGH (ref <150)

## 2022-12-19 LAB — HEMOGLOBIN A1C
Hgb A1c MFr Bld: 8 %{Hb} — ABNORMAL HIGH (ref <5.7)
Mean Plasma Glucose: 183 mg/dL
eAG (mmol/L): 10.1 mmol/L

## 2022-12-19 LAB — VITAMIN D 25 HYDROXY (VIT D DEFICIENCY, FRACTURES): Vit D, 25-Hydroxy: 75 ng/mL (ref 30–100)

## 2022-12-24 ENCOUNTER — Encounter: Payer: Self-pay | Admitting: Nurse Practitioner

## 2023-01-01 ENCOUNTER — Ambulatory Visit
Admission: RE | Admit: 2023-01-01 | Discharge: 2023-01-01 | Disposition: A | Discharge status: Home / Self Care | Payer: Medicare Other | Source: Ambulatory Visit | Attending: Nurse Practitioner | Admitting: Nurse Practitioner

## 2023-01-01 DIAGNOSIS — F17210 Nicotine dependence, cigarettes, uncomplicated: Secondary | ICD-10-CM

## 2023-01-01 DIAGNOSIS — Z720 Tobacco use: Secondary | ICD-10-CM

## 2023-01-01 DIAGNOSIS — J439 Emphysema, unspecified: Secondary | ICD-10-CM

## 2023-01-23 ENCOUNTER — Other Ambulatory Visit: Payer: Self-pay | Admitting: Nurse Practitioner

## 2023-01-23 DIAGNOSIS — J9809 Other diseases of bronchus, not elsewhere classified: Secondary | ICD-10-CM

## 2023-01-25 ENCOUNTER — Other Ambulatory Visit: Payer: Self-pay | Admitting: Nurse Practitioner

## 2023-01-25 ENCOUNTER — Encounter: Payer: Self-pay | Admitting: Pulmonary Disease

## 2023-01-25 DIAGNOSIS — J9809 Other diseases of bronchus, not elsewhere classified: Secondary | ICD-10-CM

## 2023-01-25 NOTE — Progress Notes (Unsigned)
PCCM note  Received urgent consult for low-dose screening CT CT scan on 01/23/23 showing a new 2.8 mm endobronchial lesion in the trachea.  Images reviewed. She will likely need a close follow-up CT scan in 3 months.  Scheduled for clinic visit on 02/27/22 with Dr. Francine Graven.  Chilton Greathouse MD [facility] See Amion for pager  If no response to pager , please call [phone] until 7pm After 7:00 pm call Elink  [phone] 01/25/2023, 2:25 PM

## 2023-02-28 ENCOUNTER — Institutional Professional Consult (permissible substitution): Payer: BC Managed Care – PPO | Admitting: Pulmonary Disease

## 2023-02-28 NOTE — Progress Notes (Deleted)
 Synopsis: Referred in January 2025 for Tracheobronchial Lesion  Subjective:   PATIENT ID: Joyce Shannon GENDER: female DOB: 23-Mar-1955, MRN: [id number]   HPI  No chief complaint on file.  Joyce Shannon is a 68 year old woman, daily smoker with history of DMII, hypertension and asthma who is referred to pulmonary clinic for tracheobronchial lesion.  Patient had her annual visit with her primary care team on 12/18/22, note reviewed, and has a 30+ pack year smoking history - enrolled in annual lung cancer screening. LCS CT Chest done on 01/01/23 shows moderate centrilobular emphysema with diffuse bronchial wall thickening. She has small scattered lung nodules, unchanged in size with largest nodule measuring 4.15mm. A small polypoid filling defect along the posterior wall of the trachea appears new and measures 2.72mm.    Past Medical History:  Diagnosis Date   Anemia    Asthma    B12 deficiency 04/23/2018   Depression    Diabetes mellitus without complication (HCC)    Hyperlipidemia    Hypertension    Right bundle branch block 05/2005   Varicose vein of leg    Vitamin D deficiency      Family History  Problem Relation Age of Onset   Heart disease Mother    Diabetes Mother    Breast cancer Mother 44       breast   Heart disease Father    Hypertension Father    Heart attack Father 23   Congestive Heart Failure Father    Prostate cancer Father 78       Prostate   Multiple sclerosis Brother    Diabetes Brother    Asthma Daughter    AAA (abdominal aortic aneurysm) Maternal Grandfather    Colon cancer Neg Hx      Social History   Socioeconomic History   Marital status: Married    Spouse name: Not on file   Number of children: 2   Years of education: Not on file   Highest education level: Not on file  Occupational History   Not on file  Tobacco Use   Smoking status: Every Day    Current packs/day: 0.75    Average packs/day: 0.8 packs/day for 44.9 years  (33.6 ttl pk-yrs)    Types: Cigarettes    Start date: 04/22/1978   Smokeless tobacco: Never   Tobacco comments:    currently <0.5 pack   Vaping Use   Vaping status: Never Used  Substance and Sexual Activity   Alcohol use: No   Drug use: No   Sexual activity: Not Currently    Birth control/protection: Post-menopausal  Other Topics Concern   Not on file  Social History Narrative   Not on file   Social Drivers of Health   Financial Resource Strain: Not on file  Food Insecurity: Not on file  Transportation Needs: Not on file  Physical Activity: Inactive (04/22/2018)   Exercise Vital Sign    Days of Exercise per Week: 0 days    Minutes of Exercise per Session: 0 min  Stress: Stress Concern Present (04/22/2018)   [employer] of Occupational Health - Occupational Stress Questionnaire    Feeling of Stress : To some extent  Social Connections: Unknown (06/20/2021)   Received from [facility], [facility]   Social Network    Social Network: Not on file  Intimate Partner Violence: Unknown (05/12/2021)   Received from [facility], [facility]   HITS    Physically Hurt: Not on file  Insult or Talk Down To: Not on file    Threaten Physical Harm: Not on file    Scream or Curse: Not on file     Allergies  Allergen Reactions   Other     Tylenol #3   Rosuvastatin     Myalgia    Tylox [Oxycodone-Acetaminophen]      Outpatient Medications Prior to Visit  Medication Sig Dispense Refill   ALPRAZolam (XANAX) 0.5 MG tablet Take 1/2 to 1 tablet twice daily as needed for severe anxiety. 30 tablet 0   amLODipine-benazepril (LOTREL) 10-40 MG capsule TAKE 1 CAPSULE BY MOUTH EVERY DAY FOR BLOOD PRESSURE 30 capsule 11   aspirin EC 81 MG tablet Take 81 mg by mouth daily. Every other day     blood glucose meter kit and supplies KIT Test blood sugar once daily and as needed. 1 each PRN   buPROPion (WELLBUTRIN XL) 150 MG 24 hr tablet TAKE 1 TABLET BY MOUTH DAILY FOR MOOD 30 tablet  11   Cyanocobalamin (B-12 PO) Take by mouth daily.     fexofenadine (ALLEGRA) 180 MG tablet Take 180 mg by mouth daily.     fluticasone (FLONASE) 50 MCG/ACT nasal spray Place 1 spray into both nostrils 2 (two) times daily as needed.     metFORMIN (GLUCOPHAGE-XR) 500 MG 24 hr tablet Take 1 tablets 2 x /day with Breakfast & Lunch  and  2 tablets at Supper for Diabetes 360 tablet 3   vitamin C (ASCORBIC ACID) 500 MG tablet Take 500 mg by mouth daily.     No facility-administered medications prior to visit.    ROS    Objective:  There were no vitals filed for this visit.   Physical Exam    CBC    Component Value Date/Time   WBC 9.9 12/18/2022 1131   RBC 4.70 12/18/2022 1131   HGB 14.6 12/18/2022 1131   HCT 43.6 12/18/2022 1131   PLT 243 12/18/2022 1131   MCV 92.8 12/18/2022 1131   MCV 95.0 01/21/2012 2018   MCH 31.1 12/18/2022 1131   MCHC 33.5 12/18/2022 1131   RDW 12.2 12/18/2022 1131   LYMPHSABS 3,470 01/25/2022 1533   MONOABS 861 03/19/2016 1549   EOSABS 188 12/18/2022 1131   BASOSABS 50 12/18/2022 1131      Latest Ref Rng & Units 12/18/2022   11:31 AM 01/25/2022    3:33 PM 10/26/2021    3:11 PM  BMP  Glucose 65 - 99 mg/dL 86  413  244   BUN 7 - 25 mg/dL 15  13  12    Creatinine 0.50 - 1.05 mg/dL 0.10  2.72  5.36   BUN/Creat Ratio 6 - 22 (calc) SEE NOTE:  SEE NOTE:  SEE NOTE:   Sodium 135 - 146 mmol/L 140  142  140   Potassium 3.5 - 5.3 mmol/L 4.4  4.1  4.6   Chloride 98 - 110 mmol/L 104  106  106   CO2 20 - 32 mmol/L 27  26  28    Calcium 8.6 - 10.4 mg/dL 9.7  9.8  9.9    Chest imaging: LCS CT Chest 01/01/23 1. Lung-RADS 4As, suspicious. Follow up low-dose chest CT without contrast in 3 months (please use the following order, "CT CHEST LCS NODULE FOLLOW-UP W/O CM") is recommended. Alternatively, PET may be considered when there is a solid component 8mm or larger. 2. The S modifier above refers to the new small polypoid filling defect along the posterior  wall of the trachea appears. This measures 2.8 mm. Although most likely this represents an area of retained secretions/mucus, small polyp or neoplasm cannot be excluded. Previously noted lung nodules are unchanged in the interval. 3. Aortic Atherosclerosis (ICD10-I70.0) and Emphysema (ICD10-J43.9).  PFT:     No data to display          Labs:  Path:  Echo:  Heart Catheterization:    Assessment & Plan:   Centrilobular emphysema (HCC)  Tracheal nodule  Discussion: ***    Current Outpatient Medications:    ALPRAZolam (XANAX) 0.5 MG tablet, Take 1/2 to 1 tablet twice daily as needed for severe anxiety., Disp: 30 tablet, Rfl: 0   amLODipine-benazepril (LOTREL) 10-40 MG capsule, TAKE 1 CAPSULE BY MOUTH EVERY DAY FOR BLOOD PRESSURE, Disp: 30 capsule, Rfl: 11   aspirin EC 81 MG tablet, Take 81 mg by mouth daily. Every other day, Disp: , Rfl:    blood glucose meter kit and supplies KIT, Test blood sugar once daily and as needed., Disp: 1 each, Rfl: PRN   buPROPion (WELLBUTRIN XL) 150 MG 24 hr tablet, TAKE 1 TABLET BY MOUTH DAILY FOR MOOD, Disp: 30 tablet, Rfl: 11   Cyanocobalamin (B-12 PO), Take by mouth daily., Disp: , Rfl:    fexofenadine (ALLEGRA) 180 MG tablet, Take 180 mg by mouth daily., Disp: , Rfl:    fluticasone (FLONASE) 50 MCG/ACT nasal spray, Place 1 spray into both nostrils 2 (two) times daily as needed., Disp: , Rfl:    metFORMIN (GLUCOPHAGE-XR) 500 MG 24 hr tablet, Take 1 tablets 2 x /day with Breakfast & Lunch  and  2 tablets at Supper for Diabetes, Disp: 360 tablet, Rfl: 3   vitamin C (ASCORBIC ACID) 500 MG tablet, Take 500 mg by mouth daily., Disp: , Rfl:

## 2023-04-03 ENCOUNTER — Encounter: Payer: Self-pay | Admitting: Nurse Practitioner

## 2023-04-29 ENCOUNTER — Ambulatory Visit: Payer: Medicare PPO | Admitting: General Practice

## 2023-05-29 ENCOUNTER — Encounter: Payer: Self-pay | Admitting: General Practice

## 2023-05-29 ENCOUNTER — Ambulatory Visit: Admitting: General Practice

## 2023-05-29 VITALS — BP 138/72 | HR 68 | Temp 99.1°F | Ht 66.5 in | Wt 190.0 lb

## 2023-05-29 DIAGNOSIS — T466X5A Adverse effect of antihyperlipidemic and antiarteriosclerotic drugs, initial encounter: Secondary | ICD-10-CM

## 2023-05-29 DIAGNOSIS — J302 Other seasonal allergic rhinitis: Secondary | ICD-10-CM | POA: Insufficient documentation

## 2023-05-29 DIAGNOSIS — E66811 Obesity, class 1: Secondary | ICD-10-CM | POA: Diagnosis not present

## 2023-05-29 DIAGNOSIS — E1169 Type 2 diabetes mellitus with other specified complication: Secondary | ICD-10-CM

## 2023-05-29 DIAGNOSIS — J439 Emphysema, unspecified: Secondary | ICD-10-CM

## 2023-05-29 DIAGNOSIS — E1122 Type 2 diabetes mellitus with diabetic chronic kidney disease: Secondary | ICD-10-CM | POA: Diagnosis not present

## 2023-05-29 DIAGNOSIS — N182 Chronic kidney disease, stage 2 (mild): Secondary | ICD-10-CM

## 2023-05-29 DIAGNOSIS — R911 Solitary pulmonary nodule: Secondary | ICD-10-CM

## 2023-05-29 DIAGNOSIS — I10 Essential (primary) hypertension: Secondary | ICD-10-CM | POA: Diagnosis not present

## 2023-05-29 DIAGNOSIS — N181 Chronic kidney disease, stage 1: Secondary | ICD-10-CM | POA: Diagnosis not present

## 2023-05-29 DIAGNOSIS — Z7689 Persons encountering health services in other specified circumstances: Secondary | ICD-10-CM

## 2023-05-29 DIAGNOSIS — G720 Drug-induced myopathy: Secondary | ICD-10-CM

## 2023-05-29 DIAGNOSIS — I700 Atherosclerosis of aorta: Secondary | ICD-10-CM

## 2023-05-29 DIAGNOSIS — Z1231 Encounter for screening mammogram for malignant neoplasm of breast: Secondary | ICD-10-CM

## 2023-05-29 DIAGNOSIS — E785 Hyperlipidemia, unspecified: Secondary | ICD-10-CM

## 2023-05-29 DIAGNOSIS — Z720 Tobacco use: Secondary | ICD-10-CM | POA: Diagnosis not present

## 2023-05-29 DIAGNOSIS — F3341 Major depressive disorder, recurrent, in partial remission: Secondary | ICD-10-CM

## 2023-05-29 NOTE — Assessment & Plan Note (Signed)
 December 2024 CT shows lung nodule and a new tracheal lesion. She has not followed up with the pulmonologist. Phone number and address provided today on AVS.  She will call and make appointment. Referral placed.

## 2023-05-29 NOTE — Progress Notes (Signed)
 New Patient Office Visit  Subjective    Patient ID: Joyce Shannon, female    DOB: Jul 14, 1955  Age: 68 y.o. MRN: [id number]  CC:  Chief Complaint  Patient presents with   New Patient (Initial Visit)    HPI Joyce Shannon is a 68 y.o. female presents to establish care.  Previous pcp/physical/labs: [facility].   HTN/HLD/aortic arch atherosclerosis: currently managed on amlodipine -benazepril  10-40 mg once daily. Does not check BP at home. She does not take any medication for HLD. She developed myalgias with Crestor . She has been trying to monitor her diet and stays physically active. She denies any chest pain, shortness of breath and difficulty breathing. She is supposed to take aspirin EC 81mg  once daily. She has not been taking that.   DM2: Previously managed Metformin  XR 500 1 tablet with breakfast and lunch and 2 tablets at dinner. She stopped the metformin  in November 2024.   Depression and anxiety: currently managed on Wellbutrin  Xl 150 mg once daily. She has been tolerating that well. She does have a a prescription of Xanax  0.5 mg as needed. She does have it but uses it sparingly. She denies SI/HI.   Seasonal allergies: currently managed on Allegra 180 mg once daily and Flonase nasal spray daily.   Lung nodule/tracheobronchial lesion: CT scan on 01/23/23 showing a new 2.85 mm endobronchial lesion in the trachea. Per Dr. Isabel Many note, patient will need a close follow-up CT in three months and was scheduled for a clinic visit in January. Patient did not show for the appointment. She states that today is the first that she knows anything about the lesion or the appointment. She knew about a follow up CT but not about the lesion. She did hear from someone earlier in the week about rescheduling the CT but does not know who. She would prefer pulmonologist in [place] as it is close to her work. She is a smoker but reports that she does not smoke daily. Today she denies any difficulty  swallowing, chest pain, shortness of breath or difficulty breathing.    Outpatient Encounter Medications as of 05/29/2023  Medication Sig   ALPRAZolam  (XANAX ) 0.5 MG tablet Take 1/2 to 1 tablet twice daily as needed for severe anxiety.   amLODipine -benazepril  (LOTREL) 10-40 MG capsule TAKE 1 CAPSULE BY MOUTH EVERY DAY FOR BLOOD PRESSURE   aspirin EC 81 MG tablet Take 81 mg by mouth daily. Every other day   blood glucose meter kit and supplies KIT Test blood sugar once daily and as needed.   buPROPion  (WELLBUTRIN  XL) 150 MG 24 hr tablet TAKE 1 TABLET BY MOUTH DAILY FOR MOOD   Cyanocobalamin (B-12 PO) Take by mouth daily.   fexofenadine (ALLEGRA) 180 MG tablet Take 180 mg by mouth daily.   fluticasone (FLONASE) 50 MCG/ACT nasal spray Place 1 spray into both nostrils 2 (two) times daily as needed.   vitamin C (ASCORBIC ACID) 500 MG tablet Take 500 mg by mouth daily.   [DISCONTINUED] metFORMIN  (GLUCOPHAGE -XR) 500 MG 24 hr tablet Take 1 tablets 2 x /day with Breakfast & Lunch  and  2 tablets at Supper for Diabetes   No facility-administered encounter medications on file as of 05/29/2023.    Past Medical History:  Diagnosis Date   Anemia    Asthma    B12 deficiency 04/23/2018   Depression    Diabetes mellitus without complication (HCC)    Hyperlipidemia    Hypertension    Right bundle branch block 05/2005  Varicose vein of leg    Vitamin D  deficiency     Past Surgical History:  Procedure Laterality Date   ABDOMINAL HYSTERECTOMY     Total    CESAREAN SECTION     CHOLECYSTECTOMY     ovarian cystectomy  1985   TONSILLECTOMY      Family History  Problem Relation Age of Onset   Heart disease Mother    Diabetes Mother    Breast cancer Mother 24       breast   Heart disease Father    Hypertension Father    Heart attack Father 72   Congestive Heart Failure Father    Prostate cancer Father 3       Prostate   Multiple sclerosis Brother    Diabetes Brother    Asthma Daughter     AAA (abdominal aortic aneurysm) Maternal Grandfather    Colon cancer Neg Hx     Social History   Socioeconomic History   Marital status: Married    Spouse name: Not on file   Number of children: 2   Years of education: Not on file   Highest education level: Not on file  Occupational History   Not on file  Tobacco Use   Smoking status: Every Day    Current packs/day: 0.75    Average packs/day: 0.7 packs/day for 45.1 years (33.8 ttl pk-yrs)    Types: Cigarettes    Start date: 04/22/1978   Smokeless tobacco: Never   Tobacco comments:    currently <0.5 pack   Vaping Use   Vaping status: Never Used  Substance and Sexual Activity   Alcohol use: No   Drug use: No   Sexual activity: Not Currently    Birth control/protection: Post-menopausal  Other Topics Concern   Not on file  Social History Narrative   Not on file   Social Drivers of Health   Financial Resource Strain: Not on file  Food Insecurity: Not on file  Transportation Needs: Not on file  Physical Activity: Inactive (04/22/2018)   Exercise Vital Sign    Days of Exercise per Week: 0 days    Minutes of Exercise per Session: 0 min  Stress: Stress Concern Present (04/22/2018)   [employer] of Occupational Health - Occupational Stress Questionnaire    Feeling of Stress : To some extent  Social Connections: Unknown (06/20/2021)   Received from [facility], [facility]   Social Network    Social Network: Not on file  Intimate Partner Violence: Unknown (05/12/2021)   Received from [facility], [facility]   HITS    Physically Hurt: Not on file    Insult or Talk Down To: Not on file    Threaten Physical Harm: Not on file    Scream or Curse: Not on file    Review of Systems  Constitutional:  Negative for chills and fever.  Respiratory:  Negative for shortness of breath.   Cardiovascular:  Negative for chest pain.  Gastrointestinal:  Negative for abdominal pain, constipation, diarrhea, heartburn, nausea  and vomiting.  Genitourinary:  Negative for dysuria, frequency and urgency.  Neurological:  Negative for dizziness and headaches.  Endo/Heme/Allergies:  Negative for polydipsia.  Psychiatric/Behavioral:  Negative for depression and suicidal ideas. The patient is not nervous/anxious.         Objective    BP 138/72 (BP Location: Left Arm, Patient Position: Sitting, Cuff Size: Normal)   Pulse 68   Temp 99.1 F (37.3 C) (Oral)  Ht 5' 6.5" (1.689 m)   Wt 190 lb (86.2 kg)   SpO2 98%   BMI 30.21 kg/m   Physical Exam Vitals and nursing note reviewed.  Constitutional:      Appearance: Normal appearance.  Cardiovascular:     Rate and Rhythm: Normal rate and regular rhythm.     Pulses: Normal pulses.     Heart sounds: Normal heart sounds.  Pulmonary:     Effort: Pulmonary effort is normal.     Breath sounds: Normal breath sounds.  Neurological:     Mental Status: She is alert and oriented to person, place, and time.  Psychiatric:        Mood and Affect: Mood normal.        Behavior: Behavior normal.        Thought Content: Thought content normal.        Judgment: Judgment normal.         Assessment & Plan:  Type 2 diabetes mellitus with stage 1 chronic kidney disease, without long-term current use of insulin  (HCC) Assessment & Plan: Uncontrolled. Last A1c in November 2024 was 8.0. Stopped her metformin  due to GI side effects.  Has been off meds since November. Hemoglobin A1c pending.  Orders: -     Hemoglobin A1c -     Comprehensive metabolic panel with GFR -     Lipid panel -     CBC -     TSH  Establishing care with new doctor, encounter for Assessment & Plan: EMR reviewed briefly.    Encounter for screening mammogram for malignant neoplasm of breast -     3D Screening Mammogram, Left and Right; Future  Essential hypertension Assessment & Plan: Blood pressure slightly elevated with systolic reading today. Continue amlodipine -benazepril  10-40 mg once  daily. Asked patient to keep log at home.  Follow-up in 2 weeks.  CMP pending.  Orders: -     Hemoglobin A1c -     Comprehensive metabolic panel with GFR -     Lipid panel -     CBC -     TSH  Obesity (BMI 30.0-34.9) Assessment & Plan: Discussed the importance of healthy diet and exercise to affect sustainable weight loss.    Orders: -     Hemoglobin A1c -     Comprehensive metabolic panel with GFR -     Lipid panel -     CBC -     TSH  Lung nodule Assessment & Plan: December 2024 CT shows lung nodule and a new tracheal lesion. She has not followed up with the pulmonologist. Phone number and address provided today on AVS.  She will call and make appointment. Referral placed.  Orders: -     Pulmonary Visit  Tobacco use (30+ years, current smoker)  Assessment & Plan: Smoking cessation instruction/counseling given:  counseled patient on the dangers of tobacco use, advised patient to stop smoking, and reviewed strategies to maximize success    Statin myopathy Assessment & Plan: Off of statin.   Major depressive disorder, recurrent episode, in partial remission with anxious distress (HCC) Assessment & Plan: Controlled.  Continue Wellbutrin  daily and xanax  as needed and sparingly.    Hyperlipidemia associated with type 2 diabetes mellitus (HCC) Assessment & Plan: Lipid panel pending. She is unable to take statin. Consider Zetia  depending on lab work.   Pulmonary emphysema, unspecified emphysema type (HCC) Assessment & Plan: Controlled.  Discussed smoking cessation.   CKD stage 2 due to type  2 diabetes mellitus (HCC) Assessment & Plan: Repeat C MP pending.   Aortic arch atherosclerosis (HCC) Assessment & Plan: Discussed restarting her aspirin 81 mg daily.   Seasonal allergies Assessment & Plan: Controlled. Continue Allegra and Flonase.      Return in about 2 weeks (around 06/12/2023) for health maintainence.   Jolanda Nation, NP

## 2023-05-29 NOTE — Assessment & Plan Note (Addendum)
 Controlled.  Continue Wellbutrin  daily and xanax  as needed and sparingly.

## 2023-05-29 NOTE — Assessment & Plan Note (Addendum)
 EMR reviewed briefly.

## 2023-05-29 NOTE — Assessment & Plan Note (Signed)
 Discussed restarting her aspirin 81 mg daily.

## 2023-05-29 NOTE — Assessment & Plan Note (Signed)
 Discussed the importance of healthy diet and exercise to affect sustainable weight loss.

## 2023-05-29 NOTE — Patient Instructions (Addendum)
 Stop by the lab prior to leaving today. I will notify you of your results once received.   Please call [facility] with Dr. Diania Fortes for finding on the CT scan in December.  Your previous appointment was for 02/28/23. Please call and schedule the appt.   Address: [address], [place], [place] [address] Phone: [phone]  Follow up with in 2 weeks.  It was a pleasure meeting you!

## 2023-05-29 NOTE — Assessment & Plan Note (Signed)
 Off of statin.

## 2023-05-29 NOTE — Assessment & Plan Note (Signed)
 Lipid panel pending. She is unable to take statin. Consider Zetia  depending on lab work.

## 2023-05-29 NOTE — Assessment & Plan Note (Signed)
 Controlled.  Discussed smoking cessation.

## 2023-05-29 NOTE — Assessment & Plan Note (Signed)
 Smoking cessation instruction/counseling given:  counseled patient on the dangers of tobacco use, advised patient to stop smoking, and reviewed strategies to maximize success

## 2023-05-29 NOTE — Assessment & Plan Note (Signed)
 Controlled. Continue Allegra and Flonase.

## 2023-05-29 NOTE — Assessment & Plan Note (Signed)
 Uncontrolled. Last A1c in November 2024 was 8.0. Stopped her metformin  due to GI side effects.  Has been off meds since November. Hemoglobin A1c pending.

## 2023-05-29 NOTE — Assessment & Plan Note (Signed)
 Blood pressure slightly elevated with systolic reading today. Continue amlodipine -benazepril  10-40 mg once daily. Asked patient to keep log at home.  Follow-up in 2 weeks.  CMP pending.

## 2023-05-29 NOTE — Assessment & Plan Note (Signed)
Repeat CMP pending. 

## 2023-05-30 ENCOUNTER — Encounter: Payer: Self-pay | Admitting: General Practice

## 2023-05-30 DIAGNOSIS — E1122 Type 2 diabetes mellitus with diabetic chronic kidney disease: Secondary | ICD-10-CM

## 2023-05-30 LAB — COMPREHENSIVE METABOLIC PANEL WITH GFR
ALT: 12 U/L (ref 0–35)
AST: 13 U/L (ref 0–37)
Albumin: 4.4 g/dL (ref 3.5–5.2)
Alkaline Phosphatase: 76 U/L (ref 39–117)
BUN: 15 mg/dL (ref 6–23)
CO2: 31 meq/L (ref 19–32)
Calcium: 10 mg/dL (ref 8.4–10.5)
Chloride: 102 meq/L (ref 96–112)
Creatinine, Ser: 0.84 mg/dL (ref 0.40–1.20)
GFR: 70.91 mL/min (ref >60.00)
Glucose, Bld: 112 mg/dL — ABNORMAL HIGH (ref 70–99)
Potassium: 3.8 meq/L (ref 3.5–5.1)
Sodium: 139 meq/L (ref 135–145)
Total Bilirubin: 0.4 mg/dL (ref 0.2–1.2)
Total Protein: 7.2 g/dL (ref 6.0–8.3)

## 2023-05-30 LAB — LIPID PANEL
Cholesterol: 207 mg/dL — ABNORMAL HIGH (ref 0–200)
HDL: 41.6 mg/dL (ref >39.00)
LDL Cholesterol: 108 mg/dL — ABNORMAL HIGH (ref 0–99)
NonHDL: 165.67
Total CHOL/HDL Ratio: 5
Triglycerides: 288 mg/dL — ABNORMAL HIGH (ref 0.0–149.0)
VLDL: 57.6 mg/dL — ABNORMAL HIGH (ref 0.0–40.0)

## 2023-05-30 LAB — CBC
HCT: 42.3 % (ref 36.0–46.0)
Hemoglobin: 14.3 g/dL (ref 12.0–15.0)
MCHC: 33.7 g/dL (ref 30.0–36.0)
MCV: 93.2 fl (ref 78.0–100.0)
Platelets: 221 10*3/uL (ref 150.0–400.0)
RBC: 4.54 Mil/uL (ref 3.87–5.11)
RDW: 12.7 % (ref 11.5–15.5)
WBC: 6.6 10*3/uL (ref 4.0–10.5)

## 2023-05-30 LAB — TSH: TSH: 3.59 u[IU]/mL (ref 0.35–5.50)

## 2023-05-30 LAB — HEMOGLOBIN A1C: Hgb A1c MFr Bld: 8.5 % — ABNORMAL HIGH (ref 4.6–6.5)

## 2023-06-03 MED ORDER — METFORMIN HCL ER 500 MG PO TB24: 500.0000 mg | ORAL_TABLET | Freq: Every day | ORAL | 0 refills | Status: DC

## 2023-06-03 NOTE — Telephone Encounter (Signed)
 Patient needs prescription of metformin  sent in; does not have any on hand.

## 2023-06-05 ENCOUNTER — Encounter: Payer: Self-pay | Admitting: Pulmonary Disease

## 2023-06-05 ENCOUNTER — Ambulatory Visit: Admitting: Pulmonary Disease

## 2023-06-05 VITALS — BP 110/60 | HR 67 | Temp 97.6°F | Ht 66.5 in | Wt 190.0 lb

## 2023-06-05 DIAGNOSIS — F17210 Nicotine dependence, cigarettes, uncomplicated: Secondary | ICD-10-CM

## 2023-06-05 DIAGNOSIS — J439 Emphysema, unspecified: Secondary | ICD-10-CM | POA: Diagnosis not present

## 2023-06-05 DIAGNOSIS — R911 Solitary pulmonary nodule: Secondary | ICD-10-CM

## 2023-06-05 MED ORDER — ALBUTEROL SULFATE HFA 108 (90 BASE) MCG/ACT IN AERS: 2.0000 | INHALATION_SPRAY | Freq: Four times a day (QID) | RESPIRATORY_TRACT | 2 refills | Status: AC | PRN

## 2023-06-05 NOTE — Progress Notes (Signed)
 Synopsis: Referred in by Jolanda Nation, NP   Subjective:   PATIENT ID: Joyce Shannon GENDER: female DOB: March 15, 1955, MRN: [id number]  Chief Complaint  Patient presents with   Consult    HPI Ms. Joyce Shannon is a pleasant 68 year old female patient with a past medical history of essential hypertension, type 2 diabetes mellitus, hyperlipidemia and a history of tobacco use disorder presenting today to the pulmonary clinic for the evaluation of an endotracheal lesion.  She is enrolled in the lung cancer screening program and underwent a CT chest in December 2024 that revealed a small polypoid filling defect along the posterior wall of the trachea that appears new measuring 2.8 mm.  It also showed moderate centrilobular emphysema with diffuse bronchial wall thickening.  From a respiratory standpoint she is asymptomatic and pretty active with no limitations.  She denies any shortness of breath chest pain chest tightness or wheezing.  She denies any weight loss or loss of appetite.  She denies any B symptoms.  Family history -she denies any family history of lung diseases.  Social history -she is an active smoker smokes 3 cigarettes a day.  Used to smoke 1 pack/day for about 40 years.  ROS All systems were reviewed and are negative except for the above. Objective:   Vitals:   06/05/23 1304  BP: 110/60  Pulse: 67  Temp: 97.6 F (36.4 C)  TempSrc: Temporal  SpO2: 95%  Weight: 190 lb (86.2 kg)  Height: 5' 6.5" (1.689 m)   95% on RA BMI Readings from Last 3 Encounters:  06/05/23 30.21 kg/m  05/29/23 30.21 kg/m  12/18/22 30.35 kg/m   Wt Readings from Last 3 Encounters:  06/05/23 190 lb (86.2 kg)  05/29/23 190 lb (86.2 kg)  12/18/22 193 lb 12.8 oz (87.9 kg)    Physical Exam GEN: NAD, Healthy Appearing HEENT: Supple Neck, Reactive Pupils, EOMI  CVS: Normal S1, Normal S2, RRR, No murmurs or ES appreciated  Lungs: Clear bilateral air entry.  Abdomen: Soft, non tender, non  distended, + BS  Extremities: Warm and well perfused, No edema  Skin: No suspicious lesions appreciated  Psych: Normal Affect  Ancillary Information   CBC    Component Value Date/Time   WBC 6.6 05/29/2023 1454   RBC 4.54 05/29/2023 1454   HGB 14.3 05/29/2023 1454   HCT 42.3 05/29/2023 1454   PLT 221.0 05/29/2023 1454   MCV 93.2 05/29/2023 1454   MCV 95.0 01/21/2012 2018   MCH 31.1 12/18/2022 1131   MCHC 33.7 05/29/2023 1454   RDW 12.7 05/29/2023 1454   LYMPHSABS 3,470 01/25/2022 1533   MONOABS 861 03/19/2016 1549   EOSABS 188 12/18/2022 1131   BASOSABS 50 12/18/2022 1131   Labs and imaging were reviewed.     No data to display           Assessment & Plan:  Ms. Joyce Shannon is a pleasant 68 year old female patient with a past medical history of essential hypertension, type 2 diabetes mellitus, hyperlipidemia and a history of tobacco use disorder presenting today to the pulmonary clinic for the evaluation of an endotracheal lesion.  # Endotracheal lesion This was seen on the lung cancer screening CT in November 2024.  With differential including malignancy but more likely mucous as it has a nonspecific shape.  Patient is currently asymptomatic.  []  Repeat a CT chest without contrast in 1 to 2 weeks.  And will decide on further evaluation based on that.  Otherwise she can resume her  lung cancer screening program.  # Emphysema With the degree of emphysema she has I anticipated some degree of COPD.  However she is adamant that she is completely asymptomatic and can walk up to 7 months without any limitations.  Therefore we have elected to not obtain pulmonary function test or even inhaler therapy however she will have albuterol to be used as needed and if symptoms occur then we can pursue PFTs and maintenance inhaler therapy if needed.  Return if symptoms worsen or fail to improve.  I spent 60 minutes caring for this patient today, including preparing to see the patient, obtaining  a medical history , reviewing a separately obtained history, performing a medically appropriate examination and/or evaluation, counseling and educating the patient/family/caregiver, ordering medications, tests, or procedures, documenting clinical information in the electronic health record, and independently interpreting results (not separately reported/billed) and communicating results to the patient/family/caregiver  Annitta Kindler, MD [facility] 06/05/2023 3:31 PM

## 2023-06-07 ENCOUNTER — Ambulatory Visit
Admission: RE | Admit: 2023-06-07 | Discharge: 2023-06-07 | Disposition: A | Discharge status: Home / Self Care | Source: Ambulatory Visit | Attending: General Practice | Admitting: General Practice

## 2023-06-07 DIAGNOSIS — Z1231 Encounter for screening mammogram for malignant neoplasm of breast: Secondary | ICD-10-CM

## 2023-06-13 ENCOUNTER — Ambulatory Visit (INDEPENDENT_AMBULATORY_CARE_PROVIDER_SITE_OTHER): Admitting: General Practice

## 2023-06-13 ENCOUNTER — Encounter: Payer: Self-pay | Admitting: General Practice

## 2023-06-13 VITALS — BP 120/70 | HR 94 | Temp 97.9°F | Ht 66.5 in | Wt 188.0 lb

## 2023-06-13 DIAGNOSIS — E1122 Type 2 diabetes mellitus with diabetic chronic kidney disease: Secondary | ICD-10-CM

## 2023-06-13 DIAGNOSIS — Z780 Asymptomatic menopausal state: Secondary | ICD-10-CM | POA: Diagnosis not present

## 2023-06-13 DIAGNOSIS — Z7984 Long term (current) use of oral hypoglycemic drugs: Secondary | ICD-10-CM

## 2023-06-13 DIAGNOSIS — R911 Solitary pulmonary nodule: Secondary | ICD-10-CM

## 2023-06-13 DIAGNOSIS — E1169 Type 2 diabetes mellitus with other specified complication: Secondary | ICD-10-CM

## 2023-06-13 DIAGNOSIS — I10 Essential (primary) hypertension: Secondary | ICD-10-CM | POA: Diagnosis not present

## 2023-06-13 DIAGNOSIS — E785 Hyperlipidemia, unspecified: Secondary | ICD-10-CM

## 2023-06-13 DIAGNOSIS — N181 Chronic kidney disease, stage 1: Secondary | ICD-10-CM

## 2023-06-13 DIAGNOSIS — Z1211 Encounter for screening for malignant neoplasm of colon: Secondary | ICD-10-CM | POA: Insufficient documentation

## 2023-06-13 LAB — MICROALBUMIN / CREATININE URINE RATIO
Creatinine,U: 29.9 mg/dL
Microalb Creat Ratio: UNDETERMINED mg/g (ref 0.0–30.0)
Microalb, Ur: <0.7 mg/dL

## 2023-06-13 MED ORDER — EZETIMIBE 10 MG PO TABS: 10.0000 mg | ORAL_TABLET | Freq: Every day | ORAL | 0 refills | Status: DC

## 2023-06-13 MED ORDER — METFORMIN HCL ER 500 MG PO TB24
500.0000 mg | ORAL_TABLET | Freq: Two times a day (BID) | ORAL | Status: DC
Start: 2023-06-13 — End: 2023-09-02

## 2023-06-13 NOTE — Patient Instructions (Addendum)
 Schedule diabetes eye exam.  Increase metformin  to twice a day, once in the morning and once in the evening with meals.   Start Zetia  10 mg once daily for cholesterol. I have provided the handout with the side effects and more information about the medication.   Call and get the CT scan scheduled.  You have an order for:  []   2D Mammogram  []   3D Mammogram  [x]   Bone Density     Please call for appointment:  [facility]  [address] [place] [address] [phone] [facility] [address] [place], [place] [address] [phone] [facility] [address] [place], [place] [address] [phone]   Make sure to wear two-piece clothing.  No lotions, powders, or deodorants the day of the appointment. Make sure to bring picture ID and insurance card.  Bring list of medications you are currently taking including any supplements.   Schedule your [place] screening mammogram through MyChart!   Log into your MyChart account.  Go to 'Visit' (or 'Appointments' if on mobile App) --> Schedule an Appointment  Under 'Select a Reason for Visit' choose the Mammogram Screening option.  Complete the pre-visit questions and select the time and place that best fits your schedule.    You will either be contacted via phone regarding your referral to gastroenterology , or you may receive a letter on your MyChart portal from our referral team with instructions for scheduling an appointment. Please let us  know if you have not been contacted by anyone within two weeks.  Follow up in 3 months.  It was a pleasure to see you today!

## 2023-06-13 NOTE — Progress Notes (Signed)
 Established Patient Office Visit  Subjective   Patient ID: Joyce Shannon, female    DOB: 05-31-1955  Age: 68 y.o. MRN: [id number]  Chief Complaint  Patient presents with   Diabetes    HPI  Joyce Shannon is a 68 year old female with past medical history of HTN, type 2 DM, osteopenia, MDD, tobacco abuse, aortic atherosclerosis presents today for a follow up.   Type 2 DM- hemoglobin A1c was 8.5 on 05/29/23. She has been off of her metformin  since November. She started her Metformin  500 mg once daily with breakfast. She started it on 06/04/23. No GI side effects as of now. Tolerating it well. Does not check her blood glucose at home.   HLD: lipid panel shows elevated numbers on 05/29/23. She is not able to tolerate statin. Statins cause myalgias. She has been trying to do lifestyle modifications but has not been successful. She has been taking a daily aspirin with no side effects. She denies any chest pain, shortness of breath or difficulty breathing.   HTN: readings much better. Currently managed on Amlodipine -benazepril  10-40 mg once daily. She has not been checking her BP at home.   Lung nodule: she was seen by the pulmonology on 06/05/23 and she will call to get her CT scan scheduled next week. She denies any shortness of breath.   Patient Active Problem List   Diagnosis Date Noted   Postmenopausal 06/13/2023   Screening for colon cancer 06/13/2023   Lung nodule 05/29/2023   Seasonal allergies 05/29/2023   Varicose veins of leg with pain, right 07/18/2021   Statin myopathy 07/18/2021   B12 deficiency 07/16/2020   History of adenomatous polyp of colon 07/16/2020   Osteopenia 11/02/2019   Aortic arch atherosclerosis (HCC) 07/21/2019   Emphysema of lung (HCC) 07/21/2019   Family history of abdominal aortic aneurysm (AAA) 04/22/2018   Type 2 diabetes mellitus (HCC) 10/04/2017   Vitamin D  deficiency 06/21/2014   CKD stage 2 due to type 2 diabetes mellitus (HCC) 01/07/2014    Major depressive disorder, recurrent episode, in partial remission with anxious distress (HCC) 01/07/2014   Medication management 01/07/2014   Hyperlipidemia associated with type 2 diabetes mellitus (HCC) 01/07/2014   Tobacco use (30+ years, current smoker)  01/07/2014   Essential hypertension 01/22/2012   Past Medical History:  Diagnosis Date   Anemia    Asthma    B12 deficiency 04/23/2018   Depression    Diabetes mellitus without complication (HCC)    Hyperlipidemia    Hypertension    Right bundle branch block 05/2005   Varicose vein of leg    Vitamin D  deficiency    Allergies  Allergen Reactions   Other     Tylenol #3   Rosuvastatin      Myalgia    Tylox [Oxycodone-Acetaminophen]          06/13/2023    9:13 AM 05/29/2023    2:53 PM 12/18/2022   11:53 AM  Depression screen PHQ 2/9  Decreased Interest 0 0 0  Down, Depressed, Hopeless 0 0 0  PHQ - 2 Score 0 0 0  Altered sleeping 0 0   Tired, decreased energy 0 0   Change in appetite 0 0   Feeling bad or failure about yourself  0 0   Trouble concentrating 0 0   Moving slowly or fidgety/restless 0 0   Suicidal thoughts 0 0   PHQ-9 Score 0 0   Difficult doing work/chores Not difficult at all  Not difficult at all        06/13/2023    9:13 AM 05/29/2023    2:53 PM  GAD 7 : Generalized Anxiety Score  Nervous, Anxious, on Edge 0 0  Control/stop worrying 0 0  Worry too much - different things 0 0  Trouble relaxing 0 0  Restless 0 0  Easily annoyed or irritable 0 0  Afraid - awful might happen 0 0  Total GAD 7 Score 0 0  Anxiety Difficulty Not difficult at all Not difficult at all      Review of Systems  Constitutional:  Negative for chills and fever.  Respiratory:  Negative for shortness of breath.   Cardiovascular:  Negative for chest pain.  Gastrointestinal:  Negative for abdominal pain, constipation, diarrhea, heartburn, nausea and vomiting.  Genitourinary:  Negative for dysuria, frequency and urgency.   Neurological:  Negative for dizziness and headaches.  Endo/Heme/Allergies:  Negative for polydipsia.  Psychiatric/Behavioral:  Negative for depression and suicidal ideas. The patient is not nervous/anxious.       Objective:     BP 120/70 (BP Location: Left Arm, Patient Position: Sitting, Cuff Size: Normal)   Pulse 94   Temp 97.9 F (36.6 C) (Oral)   Ht 5' 6.5" (1.689 m)   Wt 188 lb (85.3 kg)   SpO2 97%   BMI 29.89 kg/m  BP Readings from Last 3 Encounters:  06/13/23 120/70  06/05/23 110/60  05/29/23 138/72   Wt Readings from Last 3 Encounters:  06/13/23 188 lb (85.3 kg)  06/05/23 190 lb (86.2 kg)  05/29/23 190 lb (86.2 kg)      Physical Exam Vitals and nursing note reviewed.  Constitutional:      Appearance: Normal appearance.  Cardiovascular:     Rate and Rhythm: Normal rate and regular rhythm.     Pulses: Normal pulses.     Heart sounds: Normal heart sounds.  Pulmonary:     Effort: Pulmonary effort is normal.     Breath sounds: Normal breath sounds.  Neurological:     Mental Status: She is alert and oriented to person, place, and time.  Psychiatric:        Mood and Affect: Mood normal.        Behavior: Behavior normal.        Thought Content: Thought content normal.        Judgment: Judgment normal.    Diabetic Foot Exam - Simple   Simple Foot Form Diabetic Foot exam was performed with the following findings: Yes 06/13/2023  9:01 AM  Visual Inspection No deformities, no ulcerations, no other skin breakdown bilaterally: Yes Sensation Testing Intact to touch and monofilament testing bilaterally: Yes Pulse Check Posterior Tibialis and Dorsalis pulse intact bilaterally: Yes Comments       No results found for any visits on 06/13/23.     The 10-year ASCVD risk score (Arnett DK, et al., 2019) is: 31.3%    Assessment & Plan:  Type 2 diabetes mellitus with stage 1 chronic kidney disease, without long-term current use of insulin  (HCC) Assessment &  Plan: Uncontrolled.   Last A1c on 05/29/23 was 8.5.  She restarted Metformin  XR 500 once daily on 06/03/23.  Discussed at length. Has been tolerating it well.  Increase metformin  XR 500 mg BID.   Foot exam completed today.  Urine ACR pending.  Unable tolerate statin; start zetia .  Will check labs in three months.   Orders: -     metFORMIN  HCl ER;  Take 1 tablet (500 mg total) by mouth 2 (two) times daily with a meal. -     Microalbumin / creatinine urine ratio  Essential hypertension Assessment & Plan: Controlled.   Continue Amlodipine -Benazepril  10-40 mg once daily.   Hyperlipidemia associated with type 2 diabetes mellitus (HCC) Assessment & Plan: Lipid panel shows elevated cholesterol levels.  Unable to tolerate statin.  Start Zetia  10 mg once daily. Rx sent.   Orders: -     Ezetimibe ; Take 1 tablet (10 mg total) by mouth daily.  Dispense: 30 tablet; Refill: 0  Postmenopausal Assessment & Plan: Order placed for bone density scan.  Orders: -     DG Bone Density; Future  Screening for colon cancer -     Ambulatory referral to Gastroenterology  Lung nodule Assessment & Plan: Saw pulmonology.  She will call and schedule CT scan.     Return in about 3 months (around 09/13/2023) for DM.Jolanda Nation, NP

## 2023-06-13 NOTE — Assessment & Plan Note (Signed)
 Order placed for bone density scan.

## 2023-06-13 NOTE — Assessment & Plan Note (Signed)
 Saw pulmonology.  She will call and schedule CT scan.

## 2023-06-13 NOTE — Assessment & Plan Note (Signed)
 Uncontrolled.   Last A1c on 05/29/23 was 8.5.  She restarted Metformin  XR 500 once daily on 06/03/23.  Discussed at length. Has been tolerating it well.  Increase metformin  XR 500 mg BID.   Foot exam completed today.  Urine ACR pending.  Unable tolerate statin; start zetia .  Will check labs in three months.

## 2023-06-13 NOTE — Assessment & Plan Note (Signed)
 Lipid panel shows elevated cholesterol levels.  Unable to tolerate statin.  Start Zetia  10 mg once daily. Rx sent.

## 2023-06-13 NOTE — Assessment & Plan Note (Signed)
 Controlled.   Continue Amlodipine -Benazepril  10-40 mg once daily.

## 2023-06-19 ENCOUNTER — Ambulatory Visit
Admission: RE | Admit: 2023-06-19 | Discharge: 2023-06-19 | Disposition: A | Discharge status: Home / Self Care | Source: Ambulatory Visit | Attending: Pulmonary Disease | Admitting: Pulmonary Disease

## 2023-06-19 ENCOUNTER — Other Ambulatory Visit: Payer: Self-pay | Admitting: Pulmonary Disease

## 2023-06-19 DIAGNOSIS — R911 Solitary pulmonary nodule: Secondary | ICD-10-CM | POA: Insufficient documentation

## 2023-07-03 ENCOUNTER — Other Ambulatory Visit: Payer: Self-pay | Admitting: General Practice

## 2023-07-25 ENCOUNTER — Other Ambulatory Visit: Payer: Self-pay | Admitting: General Practice

## 2023-07-25 DIAGNOSIS — E1169 Type 2 diabetes mellitus with other specified complication: Secondary | ICD-10-CM

## 2023-08-07 ENCOUNTER — Encounter: Payer: Medicare Other | Admitting: Nurse Practitioner

## 2023-08-19 ENCOUNTER — Telehealth: Payer: Self-pay

## 2023-08-19 NOTE — Telephone Encounter (Signed)
 Called patient to see if she would like to come to the office for the immunization clinic we are having for medicare patients on 09/03/23 to get her shingles vaccine done. Patient did not answer and VM was left to call the office back.

## 2023-09-01 ENCOUNTER — Other Ambulatory Visit: Payer: Self-pay | Admitting: General Practice

## 2023-09-01 DIAGNOSIS — E1122 Type 2 diabetes mellitus with diabetic chronic kidney disease: Secondary | ICD-10-CM

## 2023-09-13 ENCOUNTER — Ambulatory Visit: Admitting: General Practice

## 2023-09-18 ENCOUNTER — Ambulatory Visit: Payer: Self-pay | Admitting: General Practice

## 2023-09-18 ENCOUNTER — Encounter: Payer: Self-pay | Admitting: General Practice

## 2023-09-18 ENCOUNTER — Ambulatory Visit (INDEPENDENT_AMBULATORY_CARE_PROVIDER_SITE_OTHER): Admitting: General Practice

## 2023-09-18 VITALS — BP 130/72 | HR 80 | Temp 98.1°F | Ht 66.5 in | Wt 188.0 lb

## 2023-09-18 DIAGNOSIS — E1169 Type 2 diabetes mellitus with other specified complication: Secondary | ICD-10-CM | POA: Diagnosis not present

## 2023-09-18 DIAGNOSIS — Z720 Tobacco use: Secondary | ICD-10-CM

## 2023-09-18 DIAGNOSIS — E785 Hyperlipidemia, unspecified: Secondary | ICD-10-CM | POA: Diagnosis not present

## 2023-09-18 DIAGNOSIS — E1122 Type 2 diabetes mellitus with diabetic chronic kidney disease: Secondary | ICD-10-CM

## 2023-09-18 DIAGNOSIS — N181 Chronic kidney disease, stage 1: Secondary | ICD-10-CM | POA: Diagnosis not present

## 2023-09-18 DIAGNOSIS — F418 Other specified anxiety disorders: Secondary | ICD-10-CM | POA: Insufficient documentation

## 2023-09-18 DIAGNOSIS — R944 Abnormal results of kidney function studies: Secondary | ICD-10-CM

## 2023-09-18 DIAGNOSIS — Z7984 Long term (current) use of oral hypoglycemic drugs: Secondary | ICD-10-CM

## 2023-09-18 LAB — LIPID PANEL
Cholesterol: 185 mg/dL (ref 0–200)
HDL: 36.2 mg/dL — ABNORMAL LOW (ref >39.00)
LDL Cholesterol: 92 mg/dL (ref 0–99)
NonHDL: 149.06
Total CHOL/HDL Ratio: 5
Triglycerides: 284 mg/dL — ABNORMAL HIGH (ref 0.0–149.0)
VLDL: 56.8 mg/dL — ABNORMAL HIGH (ref 0.0–40.0)

## 2023-09-18 LAB — BASIC METABOLIC PANEL WITH GFR
BUN: 16 mg/dL (ref 6–23)
CO2: 29 meq/L (ref 19–32)
Calcium: 9.2 mg/dL (ref 8.4–10.5)
Chloride: 104 meq/L (ref 96–112)
Creatinine, Ser: 0.98 mg/dL (ref 0.40–1.20)
GFR: 58.81 mL/min — ABNORMAL LOW (ref >60.00)
Glucose, Bld: 184 mg/dL — ABNORMAL HIGH (ref 70–99)
Potassium: 4.3 meq/L (ref 3.5–5.1)
Sodium: 141 meq/L (ref 135–145)

## 2023-09-18 LAB — HEMOGLOBIN A1C: Hgb A1c MFr Bld: 8.3 % — ABNORMAL HIGH (ref 4.6–6.5)

## 2023-09-18 MED ORDER — SERTRALINE HCL 50 MG PO TABS: 50.0000 mg | ORAL_TABLET | Freq: Every day | ORAL | 0 refills | Status: DC

## 2023-09-18 NOTE — Assessment & Plan Note (Signed)
 Discussed the importance of medication adherence, risks of uncontrolled hyperlipidemia.   Verbalizes understanding.  Resume Zetia  10 mg once daily.  Referral placed for pharmacy and case management. Lipid panel pending.

## 2023-09-18 NOTE — Patient Instructions (Addendum)
 Stop by the lab prior to leaving today. I will notify you of your results once received.   Continue Metformin  XR 500 mg twice daily.   Schedule diabetic eye exam.   Start sertraline  25  mg once daily for 7 days and then increase 50 mg once daily. Follow up in 4 weeks.   You will either be contacted via phone regarding your referral to therapy, pharmacist and case management , or you may receive a letter on your MyChart portal from our referral team with instructions for scheduling an appointment. Please let us  know if you have not been contacted by anyone within two weeks.  Follow up in 4 weeks for anxiety and depression.   Follow up in 12/18/23 for physical.   It was a pleasure to see you today!

## 2023-09-18 NOTE — Assessment & Plan Note (Signed)
 Smoking cessation instruction/counseling given:  counseled patient on the dangers of tobacco use, advised patient to stop smoking, and reviewed strategies to maximize success

## 2023-09-18 NOTE — Assessment & Plan Note (Addendum)
 Uncontrolled.  Last A1c was 8.5 on 05/29/23.  Discussed the importance of medication adherence and risks of uncontrolled DM.  Verbalizes understanding.  Resume Metformin  XR 500 mg BID.  Foot exam UTD.  Urine ACR UTD.  Advised to schedule diabetic eye exam.  F/u in 4 weeks.  Referral placed for pharmacy and case management for medication adherence.

## 2023-09-18 NOTE — Progress Notes (Signed)
 Established Patient Office Visit  Subjective   Patient ID: Joyce Shannon, female    DOB: [birth year]  Age: 68 y.o. MRN: [id number]  Chief Complaint  Patient presents with   Diabetes    Patient here today for DM follow up; patient has not really been taking her metformin  or zetia . Patient has been taking it when she remembers.     HPI  Joyce Shannon is a 68 year old female with past medical history of HTN, type 2 DM, osteopenia, MDD, tobacco abuse, aortic atherosclerosis presents today for a diabetes follow up.   Current medications include: Metformin  XR 500 mg BID. She is not taking her medication daily. She did it take it twice daily in the beginning.   She is not checking her blood glucose at home.   Last A1C: 8.5 on 05/29/23 Last Eye Exam: due Last Foot Exam: 06/13/23 Pneumonia Vaccination:UTD Urine Microalbumin: UTD Statin:Zetia  (takes it when she remembers)  Dietary changes since last visit: she is having pasta however she is avoiding fried, greasy foods. She has been working a lot.   Exercise: no regular exercise.   Situational anxiety/depression: her daughter moved in about a year ago. She is having a hard time adjusting. She becomes tearful easily. She feels like this is affecting her daily life to the point she is not taking her medication. She denies SI/HI.   HLD: she is currently managed on zetia . She has not been taking her medication. She has tried to monitor her diet however she is still eating pastas. She is avoid sodas. She is drinking water with electrolytes and lemon water. She denies any chest pain, shortness of breath or difficulty breathing.   Patient Active Problem List   Diagnosis Date Noted   Situational anxiety 09/18/2023   Postmenopausal 06/13/2023   Screening for colon cancer 06/13/2023   Lung nodule 05/29/2023   Seasonal allergies 05/29/2023   Varicose veins of leg with pain, right 07/18/2021   Statin myopathy 07/18/2021   B12 deficiency  07/16/2020   History of adenomatous polyp of colon 07/16/2020   Osteopenia 11/02/2019   Aortic arch atherosclerosis (HCC) 07/21/2019   Emphysema of lung (HCC) 07/21/2019   Family history of abdominal aortic aneurysm (AAA) 04/22/2018   Type 2 diabetes mellitus (HCC) 10/04/2017   Vitamin D  deficiency 06/21/2014   CKD stage 2 due to type 2 diabetes mellitus (HCC) 01/07/2014   Major depressive disorder, recurrent episode, in partial remission with anxious distress (HCC) 01/07/2014   Medication management 01/07/2014   Hyperlipidemia associated with type 2 diabetes mellitus (HCC) 01/07/2014   Tobacco use (30+ years, current smoker)  01/07/2014   Essential hypertension 01/22/2012   Past Medical History:  Diagnosis Date   Anemia    Asthma    B12 deficiency 04/23/2018   Depression    Diabetes mellitus without complication (HCC)    Hyperlipidemia    Hypertension    Right bundle branch block 05/2005   Varicose vein of leg    Vitamin D  deficiency    Past Surgical History:  Procedure Laterality Date   ABDOMINAL HYSTERECTOMY     Total    CESAREAN SECTION     CHOLECYSTECTOMY     ovarian cystectomy  1985   TONSILLECTOMY     Allergies  Allergen Reactions   Other     Tylenol #3   Rosuvastatin      Myalgia    Tylox [Oxycodone-Acetaminophen]          09/18/2023  11:58 AM 06/13/2023    9:13 AM 05/29/2023    2:53 PM  Depression screen PHQ 2/9  Decreased Interest 0 0 0  Down, Depressed, Hopeless 0 0 0  PHQ - 2 Score 0 0 0  Altered sleeping 1 0 0  Tired, decreased energy 1 0 0  Change in appetite 1 0 0  Feeling bad or failure about yourself  0 0 0  Trouble concentrating 0 0 0  Moving slowly or fidgety/restless 0 0 0  Suicidal thoughts 0 0 0  PHQ-9 Score 3 0 0  Difficult doing work/chores Not difficult at all Not difficult at all Not difficult at all       09/18/2023   11:58 AM 06/13/2023    9:13 AM 05/29/2023    2:53 PM  GAD 7 : Generalized Anxiety Score  Nervous, Anxious, on  Edge 0 0 0  Control/stop worrying 0 0 0  Worry too much - different things 0 0 0  Trouble relaxing 0 0 0  Restless 0 0 0  Easily annoyed or irritable 1 0 0  Afraid - awful might happen 0 0 0  Total GAD 7 Score 1 0 0  Anxiety Difficulty Not difficult at all Not difficult at all Not difficult at all      Review of Systems  Constitutional:  Negative for chills and fever.  Respiratory:  Negative for shortness of breath.   Cardiovascular:  Negative for chest pain.  Gastrointestinal:  Negative for abdominal pain, constipation, diarrhea, heartburn, nausea and vomiting.  Genitourinary:  Negative for dysuria, frequency and urgency.  Neurological:  Negative for dizziness and headaches.  Endo/Heme/Allergies:  Negative for polydipsia.  Psychiatric/Behavioral:  Positive for depression. Negative for suicidal ideas. The patient is nervous/anxious.       Objective:     BP 130/72   Pulse 80   Temp 98.1 F (36.7 C) (Oral)   Ht 5' 6.5 (1.689 m)   Wt 188 lb (85.3 kg)   SpO2 (!) 75%   BMI 29.89 kg/m  BP Readings from Last 3 Encounters:  09/18/23 130/72  06/13/23 120/70  06/05/23 110/60   Wt Readings from Last 3 Encounters:  09/18/23 188 lb (85.3 kg)  06/13/23 188 lb (85.3 kg)  06/05/23 190 lb (86.2 kg)      Physical Exam Vitals and nursing note reviewed.  Constitutional:      Appearance: Normal appearance.  Cardiovascular:     Rate and Rhythm: Normal rate and regular rhythm.     Pulses: Normal pulses.     Heart sounds: Normal heart sounds.  Pulmonary:     Effort: Pulmonary effort is normal.     Breath sounds: Normal breath sounds.  Neurological:     Mental Status: She is alert and oriented to person, place, and time.  Psychiatric:        Mood and Affect: Mood normal.        Behavior: Behavior normal.        Thought Content: Thought content normal.        Judgment: Judgment normal.     Comments: tearful      No results found for any visits on 09/18/23.     The  10-year ASCVD risk score (Arnett DK, et al., 2019) is: 35.6%    Assessment & Plan:  Type 2 diabetes mellitus with stage 1 chronic kidney disease, without long-term current use of insulin  (HCC) Assessment & Plan: Uncontrolled.  Last A1c was 8.5 on 05/29/23.  Discussed the importance of medication adherence and risks of uncontrolled DM.  Verbalizes understanding.  Resume Metformin  XR 500 mg BID.  Foot exam UTD.  Urine ACR UTD.  Advised to schedule diabetic eye exam.  F/u in 4 weeks.  Referral placed for pharmacy and case management for medication adherence.  Orders: -     Basic metabolic panel with GFR -     AMB Referral [facility] Care Management -     Hemoglobin A1c  Hyperlipidemia associated with type 2 diabetes mellitus (HCC) Assessment & Plan: Discussed the importance of medication adherence, risks of uncontrolled hyperlipidemia.   Verbalizes understanding.  Resume Zetia  10 mg once daily.  Referral placed for pharmacy and case management. Lipid panel pending.  Orders: -     Lipid panel -     AMB Referral [facility] Care Management  Situational anxiety Assessment & Plan: Uncontrolled.  Tearful during the visit.  Suspect this is affecting her health.   Referral placed for therapy.  Start sertraline  25 mg once daily for 7 days and then increase to 50 mg once daily thereafter.  Follow up in 4 weeks.  Orders: -     Ambulatory referral to Psychology -     Sertraline  HCl; Take 1 tablet (50 mg total) by mouth daily.  Dispense: 30 tablet; Refill: 0  Tobacco use (30+ years, current smoker)  Assessment & Plan: Smoking cessation instruction/counseling given:  counseled patient on the dangers of tobacco use, advised patient to stop smoking, and reviewed strategies to maximize success      Return in about 4 weeks (around 10/16/2023) for depression.    Carrol Aurora, NP

## 2023-09-18 NOTE — Assessment & Plan Note (Signed)
 Uncontrolled.  Tearful during the visit.  Suspect this is affecting her health.   Referral placed for therapy.  Start sertraline  25 mg once daily for 7 days and then increase to 50 mg once daily thereafter.  Follow up in 4 weeks.

## 2023-09-19 ENCOUNTER — Other Ambulatory Visit: Payer: Self-pay | Admitting: Nurse Practitioner

## 2023-09-19 DIAGNOSIS — I10 Essential (primary) hypertension: Secondary | ICD-10-CM

## 2023-09-20 ENCOUNTER — Telehealth: Payer: Self-pay

## 2023-09-20 NOTE — Telephone Encounter (Signed)
 Copied from CRM #[id number]. Topic: Appointments - Scheduling Inquiry for Clinic >> Sep 20, 2023  8:14 AM Dedra B wrote: Reason for CRM: Pt called in inquiring about her appointments that were scheduled at yesterday's visit. Pt appts were scheduled under another patient name and her copay was billed under another pt. Warm transfer to [facility].

## 2023-09-20 NOTE — Progress Notes (Signed)
 Complex Care Management Note Care Guide Note  09/20/2023 Name: STACEY SAGO MRN: [id number] DOB: 1955-11-21   Complex Care Management Outreach Attempts: An unsuccessful telephone outreach was attempted today to offer the patient information about available complex care management services.  Follow Up Plan:  Additional outreach attempts will be made to offer the patient complex care management information and services.   Encounter Outcome:  No Answer  Leotis Rase [facility], [facility] Guide  Direct Dial: [phone]  Fax [phone]

## 2023-09-20 NOTE — Telephone Encounter (Signed)
 Wasn't sure what this was pertaining to?

## 2023-09-24 NOTE — Progress Notes (Signed)
 Care Guide Pharmacy Note  09/24/2023 Name: Joyce Shannon MRN: [id number] DOB: 12/13/1955  Referred By: Vincente Shivers, NP Reason for referral: Complex Care Management, Call Attempt #1 (Unsuccessful initial outreach to schedule with PHARM D and RNCM), Call Attempt #2 (Unsuccessful initial outreaches to schedule with PHARM D and RNCM), and Call Attempt #3   Joyce Shannon is a 68 y.o. year old female who is a primary care patient of Vincente Shivers, NP.  Joyce Shannon was referred to the pharmacist for assistance related to: DMII  A third unsuccessful telephone outreach was attempted today to contact the patient who was referred to the pharmacy team for assistance with medication adherence. The [facility] team is pleased to engage with this patient at any time in the future upon receipt of referral and should he/she be interested in assistance from the [facility] team.  Joyce Shannon Joyce Shannon  [facility], [facility] Guide  Direct Dial: [phone]  Fax [phone]

## 2023-09-24 NOTE — Progress Notes (Signed)
 Complex Care Management Note Care Guide Note  09/24/2023 Name: Joyce Shannon MRN: [id number] DOB: 1956/01/01   Complex Care Management Outreach Attempts: A third unsuccessful outreach was attempted today to offer the patient with information about available complex care management services.  Follow Up Plan:  No further outreach attempts will be made at this time. We have been unable to contact the patient to offer or enroll patient in complex care management services.  Encounter Outcome:  No Answer  Leotis Rase [facility], [facility] Guide  Direct Dial: [phone]  Fax [phone]

## 2023-09-25 ENCOUNTER — Ambulatory Visit: Payer: Self-pay | Admitting: General Practice

## 2023-09-25 ENCOUNTER — Other Ambulatory Visit (INDEPENDENT_AMBULATORY_CARE_PROVIDER_SITE_OTHER)

## 2023-09-25 DIAGNOSIS — R944 Abnormal results of kidney function studies: Secondary | ICD-10-CM

## 2023-09-25 LAB — BASIC METABOLIC PANEL WITH GFR
BUN: 13 mg/dL (ref 6–23)
CO2: 28 meq/L (ref 19–32)
Calcium: 9.3 mg/dL (ref 8.4–10.5)
Chloride: 104 meq/L (ref 96–112)
Creatinine, Ser: 0.87 mg/dL (ref 0.40–1.20)
GFR: 67.83 mL/min (ref >60.00)
Glucose, Bld: 212 mg/dL — ABNORMAL HIGH (ref 70–99)
Potassium: 4 meq/L (ref 3.5–5.1)
Sodium: 140 meq/L (ref 135–145)

## 2023-10-08 ENCOUNTER — Other Ambulatory Visit: Payer: Self-pay | Admitting: General Practice

## 2023-10-08 DIAGNOSIS — F418 Other specified anxiety disorders: Secondary | ICD-10-CM

## 2023-10-08 DIAGNOSIS — I10 Essential (primary) hypertension: Secondary | ICD-10-CM

## 2023-10-08 DIAGNOSIS — N181 Chronic kidney disease, stage 1: Secondary | ICD-10-CM

## 2023-10-08 MED ORDER — AMLODIPINE BESY-BENAZEPRIL HCL 10-40 MG PO CAPS: ORAL_CAPSULE | ORAL | 2 refills | Status: DC

## 2023-10-08 NOTE — Telephone Encounter (Signed)
 Copied from CRM [id number]. Topic: Clinical - Medication Refill >> Oct 08, 2023 10:17 AM Drema MATSU wrote: Medication: amLODipine -benazepril  (LOTREL) 10-40 MG capsule  Has the patient contacted their pharmacy? Yes (Agent: If no, request that the patient contact the pharmacy for the refill. If patient does not wish to contact the pharmacy document the reason why and proceed with request.) previous provider passed away  (Agent: If yes, when and what did the pharmacy advise?)  This is the patient's preferred pharmacy:  [id number] - [place], [place] - [address] AT [facility] [address] [place] [address] Phone: [phone] Fax: [phone]  Is this the correct pharmacy for this prescription? Yes If no, delete pharmacy and type the correct one.   Has the prescription been filled recently? No 3 month supply   Is the patient out of the medication? Yes 1 left   Has the patient been seen for an appointment in the last year OR does the patient have an upcoming appointment? Yes  Can we respond through MyChart? Yes  Agent: Please be advised that Rx refills may take up to 3 business days. We ask that you follow-up with your pharmacy.

## 2023-10-16 ENCOUNTER — Encounter: Payer: Self-pay | Admitting: General Practice

## 2023-10-16 ENCOUNTER — Ambulatory Visit: Admitting: General Practice

## 2023-10-16 VITALS — BP 120/80 | HR 70 | Temp 98.4°F | Ht 66.5 in | Wt 194.0 lb

## 2023-10-16 DIAGNOSIS — N181 Chronic kidney disease, stage 1: Secondary | ICD-10-CM

## 2023-10-16 DIAGNOSIS — T7840XA Allergy, unspecified, initial encounter: Secondary | ICD-10-CM

## 2023-10-16 DIAGNOSIS — F3341 Major depressive disorder, recurrent, in partial remission: Secondary | ICD-10-CM | POA: Diagnosis not present

## 2023-10-16 DIAGNOSIS — F418 Other specified anxiety disorders: Secondary | ICD-10-CM

## 2023-10-16 DIAGNOSIS — Z7984 Long term (current) use of oral hypoglycemic drugs: Secondary | ICD-10-CM

## 2023-10-16 DIAGNOSIS — E1122 Type 2 diabetes mellitus with diabetic chronic kidney disease: Secondary | ICD-10-CM | POA: Diagnosis not present

## 2023-10-16 MED ORDER — SERTRALINE HCL 50 MG PO TABS: 25.0000 mg | ORAL_TABLET | Freq: Every day | ORAL | Status: DC

## 2023-10-16 MED ORDER — PREDNISONE 10 MG (21) PO TBPK: ORAL_TABLET | ORAL | 0 refills | Status: DC

## 2023-10-16 MED ORDER — METFORMIN HCL ER 500 MG PO TB24: 500.0000 mg | ORAL_TABLET | Freq: Two times a day (BID) | ORAL | Status: AC

## 2023-10-16 NOTE — Patient Instructions (Addendum)
 Continue sertraline  25 mg once daily bedtime (1/2 tablet). Let me know when you need the new prescription I will send in the 25 mg tablets that you will take one full tablet.   Increase Metformin  XR 500 mg twice daily, one in the morning with meals and one in the evening meals.   Make sure you are taking your ezetimibe  once daily.   For the allergies, continue your allegra once daily.   Start prednisone  pack. Please follow instructions on the packaging.   Follow up in November as scheduled.   It was a pleasure to see you today!

## 2023-10-16 NOTE — Progress Notes (Signed)
 Established Patient Office Visit  Subjective   Patient ID: Joyce Shannon, female    DOB: Nov 18, 1955  Age: 68 y.o. MRN: [id number]  Chief Complaint  Patient presents with   Depression    And anxiety f/u. Patient currently taking zoloft  25 mg daily, wellbutrin  150 mg tabs and xanax  as needed. Patient states she is doing well and does not feel like anything needs to be changed at this time.     Depression        Associated symptoms include no headaches and no suicidal ideas.   Joyce Shannon is a 68 year old female with past medical history of HTN, type 2 DM, osteopenia, MDD, tobacco abuse, aortic atherosclerosis presents today for a diabetes follow up.   Discussed the use of AI scribe software for clinical note transcription with the patient, who gave verbal consent to proceed.  History of Present Illness Joyce Shannon is a 68 year old female who presents for medication management and follow-up.  Anxiety and depression: Currently managed on Zoloft  25 mg, taking half a tablet daily, and finds it effective. She continues to use Wellbutrin  and Xanax  sparingly, noting significant improvement in her anxiety symptoms. She maintains a routine of eating properly and walking regularly.  For her diabetes, she has implemented strategies to improve medication adherence, such as using two medicine containers and setting an alarm for 10 AM daily to remind her to take her medications. Her A1c was 8.5% in April and 8.3% at the most recent check.   She experiences allergy symptoms, particularly soreness and redness around her eyes, which she attributes to exposure to mums and possibly the chemicals used on them. She is currently taking Allegra. The skin around her eyes is very sore, and her hands turned red and started burning after exposure. She is wearing gloves to minimize contact. She reports no vision changes and no rash elsewhere on her body. Her eyes are not itchy, but the area around them  is sore.   Patient Active Problem List   Diagnosis Date Noted   Situational anxiety 09/18/2023   Postmenopausal 06/13/2023   Screening for colon cancer 06/13/2023   Lung nodule 05/29/2023   Seasonal allergies 05/29/2023   Varicose veins of leg with pain, right 07/18/2021   Statin myopathy 07/18/2021   B12 deficiency 07/16/2020   History of adenomatous polyp of colon 07/16/2020   Osteopenia 11/02/2019   Aortic arch atherosclerosis (HCC) 07/21/2019   Emphysema of lung (HCC) 07/21/2019   Family history of abdominal aortic aneurysm (AAA) 04/22/2018   Type 2 diabetes mellitus (HCC) 10/04/2017   Vitamin D  deficiency 06/21/2014   CKD stage 2 due to type 2 diabetes mellitus (HCC) 01/07/2014   Major depressive disorder, recurrent episode, in partial remission with anxious distress (HCC) 01/07/2014   Medication management 01/07/2014   Hyperlipidemia associated with type 2 diabetes mellitus (HCC) 01/07/2014   Tobacco use (30+ years, current smoker)  01/07/2014   Essential hypertension 01/22/2012   Past Medical History:  Diagnosis Date   Anemia    Asthma    B12 deficiency 04/23/2018   Depression    Diabetes mellitus without complication (HCC)    Hyperlipidemia    Hypertension    Right bundle branch block 05/2005   Varicose vein of leg    Vitamin D  deficiency    Past Surgical History:  Procedure Laterality Date   ABDOMINAL HYSTERECTOMY     Total    CESAREAN SECTION     CHOLECYSTECTOMY  ovarian cystectomy  1985   TONSILLECTOMY     Allergies  Allergen Reactions   Other     Tylenol #3   Rosuvastatin      Myalgia    Tylox [Oxycodone-Acetaminophen]          09/18/2023   11:58 AM 06/13/2023    9:13 AM 05/29/2023    2:53 PM  Depression screen PHQ 2/9  Decreased Interest 0 0 0  Down, Depressed, Hopeless 0 0 0  PHQ - 2 Score 0 0 0  Altered sleeping 1 0 0  Tired, decreased energy 1 0 0  Change in appetite 1 0 0  Feeling bad or failure about yourself  0 0 0  Trouble  concentrating 0 0 0  Moving slowly or fidgety/restless 0 0 0  Suicidal thoughts 0 0 0  PHQ-9 Score 3 0 0  Difficult doing work/chores Not difficult at all Not difficult at all Not difficult at all       09/18/2023   11:58 AM 06/13/2023    9:13 AM 05/29/2023    2:53 PM  GAD 7 : Generalized Anxiety Score  Nervous, Anxious, on Edge 0 0 0  Control/stop worrying 0 0 0  Worry too much - different things 0 0 0  Trouble relaxing 0 0 0  Restless 0 0 0  Easily annoyed or irritable 1 0 0  Afraid - awful might happen 0 0 0  Total GAD 7 Score 1 0 0  Anxiety Difficulty Not difficult at all Not difficult at all Not difficult at all      Review of Systems  Constitutional:  Negative for chills and fever.  Respiratory:  Negative for shortness of breath.   Cardiovascular:  Negative for chest pain.  Gastrointestinal:  Negative for abdominal pain, constipation, diarrhea, heartburn, nausea and vomiting.  Genitourinary:  Negative for dysuria, frequency and urgency.  Skin:        Redness around eye due to the allergic reaction.  Neurological:  Negative for dizziness and headaches.  Endo/Heme/Allergies:  Negative for polydipsia.  Psychiatric/Behavioral:  Positive for depression. Negative for suicidal ideas. The patient is not nervous/anxious.       Objective:     BP 120/80   Pulse 70   Temp 98.4 F (36.9 C) (Oral)   Ht 5' 6.5 (1.689 m)   Wt 194 lb (88 kg)   SpO2 95%   BMI 30.84 kg/m  BP Readings from Last 3 Encounters:  10/16/23 120/80  09/18/23 130/72  06/13/23 120/70   Wt Readings from Last 3 Encounters:  10/16/23 194 lb (88 kg)  09/18/23 188 lb (85.3 kg)  06/13/23 188 lb (85.3 kg)      Physical Exam Vitals and nursing note reviewed.  Constitutional:      Appearance: Normal appearance.  Eyes:   Cardiovascular:     Rate and Rhythm: Normal rate and regular rhythm.     Pulses: Normal pulses.     Heart sounds: Normal heart sounds.  Pulmonary:     Effort: Pulmonary effort is  normal.     Breath sounds: Normal breath sounds.  Neurological:     Mental Status: She is alert and oriented to person, place, and time.  Psychiatric:        Mood and Affect: Mood normal.        Behavior: Behavior normal.        Thought Content: Thought content normal.        Judgment: Judgment normal.  No results found for any visits on 10/16/23.     The 10-year ASCVD risk score (Arnett DK, et al., 2019) is: 31.1%    Assessment & Plan:  Situational anxiety -     Sertraline  HCl; Take 0.5 tablets (25 mg total) by mouth daily.  Major depressive disorder, recurrent episode, in partial remission with anxious distress (HCC) -     Sertraline  HCl; Take 0.5 tablets (25 mg total) by mouth daily.  Type 2 diabetes mellitus with stage 1 chronic kidney disease, without long-term current use of insulin  (HCC) -     metFORMIN  HCl ER; Take 1 tablet (500 mg total) by mouth 2 (two) times daily with a meal.  Allergic reaction, initial encounter -     predniSONE ; Follow instructions on the package.  Dispense: 1 each; Refill: 0    Assessment and Plan Assessment & Plan Allergic contact dermatitis due to exposure to mums Allergic contact dermatitis likely due to chemicals on mums, with persistent symptoms from airborne exposure. - Prescribed prednisone  pack  - Advised monitoring of blood sugar levels due to prednisone . - Continue Allegra once daily; switch to Zyrtec if ineffective. - Avoid direct contact with mums and minimize airborne allergen exposure.  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 A1c improved to 8.3. Implemented adherence strategies. - Increase metformin  XR to 500 mg twice daily with meals. - Ensure adherence to ezetimibe  once daily. - Monitor blood sugar levels, especially with prednisone  use. - Recheck A1c in November.  Mixed hyperlipidemia associated with type 2 diabetes mellitus Mixed hyperlipidemia  managed with ezetimibe . - Continue ezetimibe  once daily.  Situational anxiety and depressive disorder Situational anxiety and depressive disorder improved with sertraline  25 mg daily. - Continue sertraline  25 mg once daily at bedtime. - Provide prescription for 25 mg tablets. - Monitor symptoms and adjust treatment as needed.   Return in about 9 weeks (around 12/18/2023) for physical and fasting labs.  flu shot.    Carrol Aurora, NP

## 2023-12-16 ENCOUNTER — Other Ambulatory Visit

## 2023-12-18 ENCOUNTER — Ambulatory Visit: Payer: Medicare Other | Admitting: Nurse Practitioner

## 2023-12-20 ENCOUNTER — Ambulatory Visit

## 2023-12-23 ENCOUNTER — Encounter: Admitting: General Practice

## 2024-01-06 ENCOUNTER — Other Ambulatory Visit: Payer: Self-pay | Admitting: General Practice

## 2024-01-06 DIAGNOSIS — I10 Essential (primary) hypertension: Secondary | ICD-10-CM

## 2024-01-06 NOTE — Telephone Encounter (Signed)
 Patient is due for CPE and fasting labs; please call patient to schedule.

## 2024-01-07 ENCOUNTER — Telehealth: Payer: Self-pay | Admitting: General Practice

## 2024-01-07 DIAGNOSIS — I10 Essential (primary) hypertension: Secondary | ICD-10-CM

## 2024-01-07 NOTE — Telephone Encounter (Unsigned)
 Copied from CRM [id number]. Topic: Clinical - Medication Refill >> Jan 07, 2024  4:46 PM Rea ORN wrote: Medication:  amLODipine -benazepril  (LOTREL) 10-40 MG capsule   Has the patient contacted their pharmacy? Yes (Agent: If no, request that the patient contact the pharmacy for the refill. If patient does not wish to contact the pharmacy document the reason why and proceed with request.) (Agent: If yes, when and what did the pharmacy advise?)  This is the patient's preferred pharmacy:  [id number] - [place], [place] - [address] AT [facility] [address] [place] [address] Phone: [phone] Fax: [phone]  Is this the correct pharmacy for this prescription? Yes If no, delete pharmacy and type the correct one.   Has the prescription been filled recently? No  Is the patient out of the medication? Yes  Has the patient been seen for an appointment in the last year OR does the patient have an upcoming appointment? Yes  Can we respond through MyChart? Yes  Agent: Please be advised that Rx refills may take up to 3 business days. We ask that you follow-up with your pharmacy.

## 2024-01-08 NOTE — Telephone Encounter (Unsigned)
 Copied from CRM [id number]. Topic: Appointments - Appointment Scheduling >> Jan 07, 2024  4:48 PM Rea ORN wrote: Patient scheduled her CPE for 12/16. Pt stated she has had labs done with [facility] 11/14 and will request that they send results to the clinic. Rx request was sent in for amLODIPine  Besy-Benazepril10-40 mg

## 2024-01-21 ENCOUNTER — Encounter: Payer: Self-pay | Admitting: General Practice

## 2024-01-21 ENCOUNTER — Ambulatory Visit: Admitting: General Practice

## 2024-01-21 ENCOUNTER — Ambulatory Visit: Payer: Self-pay | Admitting: General Practice

## 2024-01-21 VITALS — BP 122/60 | HR 76 | Temp 97.8°F | Ht 66.5 in | Wt 193.0 lb

## 2024-01-21 DIAGNOSIS — E1122 Type 2 diabetes mellitus with diabetic chronic kidney disease: Secondary | ICD-10-CM

## 2024-01-21 DIAGNOSIS — Z0000 Encounter for general adult medical examination without abnormal findings: Secondary | ICD-10-CM | POA: Diagnosis not present

## 2024-01-21 DIAGNOSIS — E1169 Type 2 diabetes mellitus with other specified complication: Secondary | ICD-10-CM

## 2024-01-21 DIAGNOSIS — M8580 Other specified disorders of bone density and structure, unspecified site: Secondary | ICD-10-CM

## 2024-01-21 DIAGNOSIS — E785 Hyperlipidemia, unspecified: Secondary | ICD-10-CM

## 2024-01-21 DIAGNOSIS — Z7984 Long term (current) use of oral hypoglycemic drugs: Secondary | ICD-10-CM

## 2024-01-21 DIAGNOSIS — I10 Essential (primary) hypertension: Secondary | ICD-10-CM

## 2024-01-21 DIAGNOSIS — N181 Chronic kidney disease, stage 1: Secondary | ICD-10-CM | POA: Diagnosis not present

## 2024-01-21 DIAGNOSIS — F418 Other specified anxiety disorders: Secondary | ICD-10-CM

## 2024-01-21 LAB — POCT GLYCOSYLATED HEMOGLOBIN (HGB A1C): Hemoglobin A1C: 8.1 % — AB (ref 4.0–5.6)

## 2024-01-21 NOTE — Patient Instructions (Addendum)
 Stop by the lab prior to leaving today. I will notify you of your results once received.   Schedule shingles vaccine at the pharmacy.   Call Dr. Hassan office for colonoscopy.   Call and schedule diabetic eye exam.   Call and schedule mammogram and bone density scan.   Resume Zetia .  Follow up in 3 months for   It was a pleasure to see you today!

## 2024-01-21 NOTE — Assessment & Plan Note (Signed)
 Improved on second reading.  Continue Amlodipine - Benazepril  10-40 mg once daily.

## 2024-01-21 NOTE — Progress Notes (Signed)
 Established Patient Office Visit  Subjective   Patient ID: Joyce Shannon, female    DOB: [birth year]  Age: 68 y.o. MRN: [id number]  Chief Complaint  Patient presents with   Annual Exam    HPI  Joyce Shannon is a 68 year old female with past medical history of HTN, emphysema CKD, HLD, DM2, osteopenia, MDD, statin myopathy, MDD, seasonal allergies presents today for complete physical and follow up of chronic conditions.  Immunizations: -Tetanus: Completed in 2018 -Influenza: completed with [facility] as part of the study.  -Shingles: due -Pneumonia: Completed   Diet: Fair diet.  Exercise: No regular exercise.  Eye exam: Completed several years ago.  Dental exam: Completed a year ago.   Mammogram: Completed in 2025 Bone Density Scan: Completed in 2021; due  Colonoscopy: Completed in 2017; due Lung Cancer Screening: Completed in 2025   Patient Active Problem List   Diagnosis Date Noted   Encounter for screening and preventative care 01/21/2024   Situational anxiety 09/18/2023   Postmenopausal 06/13/2023   Screening for colon cancer 06/13/2023   Lung nodule 05/29/2023   Seasonal allergies 05/29/2023   Varicose veins of leg with pain, right 07/18/2021   Statin myopathy 07/18/2021   B12 deficiency 07/16/2020   History of adenomatous polyp of colon 07/16/2020   Osteopenia 11/02/2019   Aortic arch atherosclerosis 07/21/2019   Emphysema of lung (HCC) 07/21/2019   Family history of abdominal aortic aneurysm (AAA) 04/22/2018   Type 2 diabetes mellitus (HCC) 10/04/2017   Vitamin D  deficiency 06/21/2014   CKD stage 2 due to type 2 diabetes mellitus (HCC) 01/07/2014   Major depressive disorder, recurrent episode, in partial remission with anxious distress 01/07/2014   Medication management 01/07/2014   Hyperlipidemia associated with type 2 diabetes mellitus (HCC) 01/07/2014   Tobacco use (30+ years, current smoker)  01/07/2014   Essential hypertension 01/22/2012    Past Medical History:  Diagnosis Date   Anemia    Asthma    B12 deficiency 04/23/2018   Depression    Diabetes mellitus without complication (HCC)    Hyperlipidemia    Hypertension    Right bundle branch block 05/2005   Varicose vein of leg    Vitamin D  deficiency    Past Surgical History:  Procedure Laterality Date   ABDOMINAL HYSTERECTOMY     Total    CESAREAN SECTION     CHOLECYSTECTOMY     ovarian cystectomy  1985   TONSILLECTOMY     Allergies[1]       01/21/2024    3:04 PM 09/18/2023   11:58 AM 06/13/2023    9:13 AM  Depression screen PHQ 2/9  Decreased Interest 0 0 0  Down, Depressed, Hopeless 0 0 0  PHQ - 2 Score 0 0 0  Altered sleeping 0 1 0  Tired, decreased energy 0 1 0  Change in appetite 0 1 0  Feeling bad or failure about yourself  0 0 0  Trouble concentrating 0 0 0  Moving slowly or fidgety/restless 0 0 0  Suicidal thoughts 0 0 0  PHQ-9 Score 0 3  0   Difficult doing work/chores Not difficult at all Not difficult at all Not difficult at all     Data saved with a previous flowsheet row definition       01/21/2024    3:04 PM 09/18/2023   11:58 AM 06/13/2023    9:13 AM 05/29/2023    2:53 PM  GAD 7 : Generalized Anxiety  Score  Nervous, Anxious, on Edge 0 0 0 0  Control/stop worrying 0 0 0 0  Worry too much - different things 0 0 0 0  Trouble relaxing 0 0 0 0  Restless 0 0 0 0  Easily annoyed or irritable 0 1 0 0  Afraid - awful might happen 0 0 0 0  Total GAD 7 Score 0 1 0 0  Anxiety Difficulty Not difficult at all Not difficult at all Not difficult at all Not difficult at all      Review of Systems  Constitutional:  Negative for chills, fever, malaise/fatigue and weight loss.  HENT:  Negative for congestion, ear discharge, ear pain, hearing loss, nosebleeds, sinus pain, sore throat and tinnitus.   Eyes:  Negative for blurred vision, double vision, pain, discharge and redness.  Respiratory:  Negative for cough, shortness of breath, wheezing  and stridor.   Cardiovascular:  Negative for chest pain, palpitations and leg swelling.  Gastrointestinal:  Negative for abdominal pain, constipation, diarrhea, heartburn, nausea and vomiting.  Genitourinary:  Negative for dysuria, frequency and urgency.  Musculoskeletal:  Negative for myalgias.  Skin:  Negative for rash.  Neurological:  Negative for dizziness, tingling, seizures, weakness and headaches.  Psychiatric/Behavioral:  Negative for depression, substance abuse and suicidal ideas. The patient is not nervous/anxious.       Objective:     BP 122/60   Pulse 76   Temp 97.8 F (36.6 C) (Oral)   Ht 5' 6.5 (1.689 m)   Wt 193 lb (87.5 kg)   SpO2 95%   BMI 30.68 kg/m  BP Readings from Last 3 Encounters:  01/21/24 122/60  10/16/23 120/80  09/18/23 130/72   Wt Readings from Last 3 Encounters:  01/21/24 193 lb (87.5 kg)  10/16/23 194 lb (88 kg)  09/18/23 188 lb (85.3 kg)      Physical Exam Vitals and nursing note reviewed.  Constitutional:      Appearance: Normal appearance.  HENT:     Head: Normocephalic and atraumatic.     Right Ear: Tympanic membrane, ear canal and external ear normal.     Left Ear: Tympanic membrane, ear canal and external ear normal.     Nose: Nose normal.     Mouth/Throat:     Mouth: Mucous membranes are moist.     Pharynx: Oropharynx is clear.  Eyes:     Conjunctiva/sclera: Conjunctivae normal.     Pupils: Pupils are equal, round, and reactive to light.  Cardiovascular:     Rate and Rhythm: Normal rate and regular rhythm.     Pulses: Normal pulses.     Heart sounds: Normal heart sounds.  Pulmonary:     Effort: Pulmonary effort is normal.     Breath sounds: Normal breath sounds.  Abdominal:     General: Abdomen is flat. Bowel sounds are normal.     Palpations: Abdomen is soft.  Musculoskeletal:        General: Normal range of motion.     Cervical back: Normal range of motion.  Skin:    General: Skin is warm and dry.     Capillary  Refill: Capillary refill takes less than 2 seconds.  Neurological:     General: No focal deficit present.     Mental Status: She is alert and oriented to person, place, and time. Mental status is at baseline.  Psychiatric:        Mood and Affect: Mood normal.  Behavior: Behavior normal.        Thought Content: Thought content normal.        Judgment: Judgment normal.      No results found for any visits on 01/21/24.     The 10-year ASCVD risk score (Arnett DK, et al., 2019) is: 33.9%    Assessment & Plan:  Encounter for screening and preventative care Assessment & Plan: Immunizations tdap and influenza, pneumonia vaccine UTD; will schedule shingrix at the pharmacy.  Mammogram UTD Colonoscopy  due   Discussed the importance of a healthy diet and regular exercise in order for weight loss, and to reduce the risk of further co-morbidity.  Exam stable. Labs pending.  Follow up in 1 year for repeat physical.    Type 2 diabetes mellitus with stage 1 chronic kidney disease, without long-term current use of insulin  (HCC) Assessment & Plan: A1c today 8.1.  Discussed the importance of medication adherence.  Advised to schedule diabetic eye exam.  Foot exam, urine ACR, CMP UTD.  Continue Metformin  XR 500 mg BID.  Pharmacy consult placed again to help with medication adherence.  Orders: -     POCT glycosylated hemoglobin (Hb A1C) -     AMB Referral [facility] Care Management  Essential hypertension Assessment & Plan: Improved on second reading.  Continue Amlodipine - Benazepril  10-40 mg once daily.   Orders: -     AMB Referral [facility] Care Management  Hyperlipidemia associated with type 2 diabetes mellitus (HCC) Assessment & Plan: Discussed the importance of medication adherence.  Verbalizes understanding.  Resume Zetia  10 mg once daily.  Discussed the importance of monitoring diet and exercise.  Lipid panel at next visit.  Orders: -     AMB Referral [facility] Care  Management  Osteopenia, unspecified location Assessment & Plan: Due for bone density scan.  She will call and schedule.   Situational anxiety Assessment & Plan: Improved.  Discussed medication adherence.  Continue Wellbutrin .  She stopped sertraline .       Return in about 3 months (around 04/20/2024) for chronic care management. Joyce Carrol Aurora, NP    [1]  Allergies Allergen Reactions   Other     Tylenol #3   Rosuvastatin      Myalgia    Tylox [Oxycodone-Acetaminophen]

## 2024-01-21 NOTE — Assessment & Plan Note (Addendum)
 A1c today 8.1.  Discussed the importance of medication adherence.  Advised to schedule diabetic eye exam.  Foot exam, urine ACR, CMP UTD.  Continue Metformin  XR 500 mg BID.  Pharmacy consult placed again to help with medication adherence.

## 2024-01-21 NOTE — Assessment & Plan Note (Signed)
 Immunizations tdap and influenza, pneumonia vaccine UTD; will schedule shingrix at the pharmacy.  Mammogram UTD Colonoscopy  due   Discussed the importance of a healthy diet and regular exercise in order for weight loss, and to reduce the risk of further co-morbidity.  Exam stable. Labs pending.  Follow up in 1 year for repeat physical.

## 2024-01-21 NOTE — Assessment & Plan Note (Signed)
 Due for bone density scan.  She will call and schedule.

## 2024-01-21 NOTE — Assessment & Plan Note (Signed)
 Improved.  Discussed medication adherence.  Continue Wellbutrin .  She stopped sertraline .

## 2024-01-21 NOTE — Assessment & Plan Note (Signed)
 Discussed the importance of medication adherence.  Verbalizes understanding.  Resume Zetia  10 mg once daily.  Discussed the importance of monitoring diet and exercise.  Lipid panel at next visit.

## 2024-01-27 ENCOUNTER — Telehealth: Payer: Self-pay

## 2024-01-27 NOTE — Progress Notes (Signed)
 Complex Care Management Note Care Guide Note  01/27/2024 Name: Joyce Shannon MRN: [id number] DOB: 08-06-1955   Complex Care Management Outreach Attempts: An unsuccessful telephone outreach was attempted today to offer the patient information about available complex care management services.  Follow Up Plan:  Additional outreach attempts will be made to offer the patient complex care management information and services.   Encounter Outcome:  No Answer  Dreama [facility]  [facility], [facility] VBC[facility] Assistant Direct Dial: [phone]  Fax: [phone]

## 2024-01-27 NOTE — Progress Notes (Signed)
 Complex Care Management Note Care Guide Note  01/27/2024 Name: Joyce Shannon MRN: [id number] DOB: 06-25-1955   Complex Care Management Outreach Attempts: A second unsuccessful outreach was attempted today to offer the patient with information about available complex care management services.  Follow Up Plan:  Additional outreach attempts will be made to offer the patient complex care management information and services.   Encounter Outcome:  No Answer  Dreama [facility]  [facility], [facility] VBCI Assistant Direct Dial: [phone]  Fax: [phone]

## 2024-01-31 NOTE — Progress Notes (Signed)
 Complex Care Management Note Care Guide Note  01/31/2024 Name: Joyce Shannon MRN: [id number] DOB: 1955/09/10   Complex Care Management Outreach Attempts: A third unsuccessful outreach was attempted today to offer the patient with information about available complex care management services.  Follow Up Plan:  No further outreach attempts will be made at this time. We have been unable to contact the patient to offer or enroll patient in complex care management services.  Encounter Outcome:  No Answer  Dreama [facility]  [facility], [facility] VBCI Assistant Direct Dial: [phone]  Fax: [phone]

## 2024-04-20 ENCOUNTER — Ambulatory Visit: Admitting: General Practice

## 2024-04-23 ENCOUNTER — Ambulatory Visit

## 2024-04-23 ENCOUNTER — Ambulatory Visit: Admitting: General Practice
